# Patient Record
Sex: Male | Born: 1980 | Race: Black or African American | Hispanic: No | Marital: Married | Smoking: Current every day smoker
Health system: Southern US, Community
[De-identification: ages and names within clinical notes are randomized; demographics above are authoritative.]

## PROBLEM LIST (undated history)

## (undated) DIAGNOSIS — E119 Type 2 diabetes mellitus without complications: Secondary | ICD-10-CM

## (undated) DIAGNOSIS — G629 Polyneuropathy, unspecified: Secondary | ICD-10-CM

## (undated) DIAGNOSIS — S62309A Unspecified fracture of unspecified metacarpal bone, initial encounter for closed fracture: Secondary | ICD-10-CM

## (undated) DIAGNOSIS — I10 Essential (primary) hypertension: Secondary | ICD-10-CM

## (undated) DIAGNOSIS — R569 Unspecified convulsions: Secondary | ICD-10-CM

## (undated) HISTORY — PX: NO PAST SURGERIES: SHX2092

---
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
## 2003-12-12 ENCOUNTER — Emergency Department (HOSPITAL_COMMUNITY): Admission: EM | Admit: 2003-12-12 | Discharge: 2003-12-12 | Payer: Self-pay | Admitting: Emergency Medicine

## 2005-04-06 ENCOUNTER — Emergency Department (HOSPITAL_COMMUNITY): Admission: EM | Admit: 2005-04-06 | Discharge: 2005-04-06 | Payer: Self-pay | Admitting: Emergency Medicine

## 2005-04-07 ENCOUNTER — Emergency Department (HOSPITAL_COMMUNITY): Admission: EM | Admit: 2005-04-07 | Discharge: 2005-04-07 | Payer: Self-pay | Admitting: Emergency Medicine

## 2005-04-19 ENCOUNTER — Emergency Department (HOSPITAL_COMMUNITY): Admission: EM | Admit: 2005-04-19 | Discharge: 2005-04-19 | Payer: Self-pay | Admitting: Emergency Medicine

## 2005-07-06 ENCOUNTER — Emergency Department (HOSPITAL_COMMUNITY): Admission: EM | Admit: 2005-07-06 | Discharge: 2005-07-06 | Payer: Self-pay | Admitting: Emergency Medicine

## 2006-04-03 ENCOUNTER — Emergency Department (HOSPITAL_COMMUNITY): Admission: EM | Admit: 2006-04-03 | Discharge: 2006-04-03 | Payer: Self-pay | Admitting: Emergency Medicine

## 2006-04-14 ENCOUNTER — Emergency Department (HOSPITAL_COMMUNITY): Admission: EM | Admit: 2006-04-14 | Discharge: 2006-04-14 | Payer: Self-pay | Admitting: Emergency Medicine

## 2006-05-04 ENCOUNTER — Emergency Department (HOSPITAL_COMMUNITY): Admission: EM | Admit: 2006-05-04 | Discharge: 2006-05-05 | Payer: Self-pay | Admitting: *Deleted

## 2007-01-28 ENCOUNTER — Emergency Department (HOSPITAL_COMMUNITY): Admission: EM | Admit: 2007-01-28 | Discharge: 2007-01-28 | Payer: Self-pay | Admitting: Family Medicine

## 2007-12-12 ENCOUNTER — Emergency Department (HOSPITAL_COMMUNITY): Admission: EM | Admit: 2007-12-12 | Discharge: 2007-12-12 | Payer: Self-pay | Admitting: Family Medicine

## 2009-06-04 ENCOUNTER — Emergency Department (HOSPITAL_COMMUNITY): Admission: EM | Admit: 2009-06-04 | Discharge: 2009-06-04 | Payer: Self-pay | Admitting: Emergency Medicine

## 2009-07-04 ENCOUNTER — Emergency Department (HOSPITAL_COMMUNITY): Admission: EM | Admit: 2009-07-04 | Discharge: 2009-07-04 | Payer: Self-pay | Admitting: Emergency Medicine

## 2009-09-04 ENCOUNTER — Emergency Department (HOSPITAL_COMMUNITY): Admission: EM | Admit: 2009-09-04 | Discharge: 2009-09-04 | Payer: Self-pay | Admitting: Emergency Medicine

## 2010-09-04 ENCOUNTER — Emergency Department (HOSPITAL_COMMUNITY)
Admission: EM | Admit: 2010-09-04 | Discharge: 2010-09-04 | Disposition: A | Discharge status: Home / Self Care | Payer: Self-pay | Attending: Emergency Medicine | Admitting: Emergency Medicine

## 2010-09-04 DIAGNOSIS — R112 Nausea with vomiting, unspecified: Secondary | ICD-10-CM | POA: Insufficient documentation

## 2010-09-04 DIAGNOSIS — R070 Pain in throat: Secondary | ICD-10-CM | POA: Insufficient documentation

## 2010-09-04 DIAGNOSIS — G40909 Epilepsy, unspecified, not intractable, without status epilepticus: Secondary | ICD-10-CM | POA: Insufficient documentation

## 2010-09-04 DIAGNOSIS — F1010 Alcohol abuse, uncomplicated: Secondary | ICD-10-CM | POA: Insufficient documentation

## 2010-09-04 LAB — CBC
MCH: 28.6 pg (ref 26.0–34.0)
Platelets: 297 10*3/uL (ref 150–400)
RBC: 5.1 MIL/uL (ref 4.22–5.81)
RDW: 13.6 % (ref 11.5–15.5)
WBC: 7.3 10*3/uL (ref 4.0–10.5)

## 2010-09-04 LAB — DIFFERENTIAL
Basophils Relative: 0 % (ref 0–1)
Eosinophils Absolute: 0.1 10*3/uL (ref 0.0–0.7)
Eosinophils Relative: 1 % (ref 0–5)
Neutrophils Relative %: 49 % (ref 43–77)

## 2010-09-04 LAB — COMPREHENSIVE METABOLIC PANEL
ALT: 41 U/L (ref 0–53)
AST: 63 U/L — ABNORMAL HIGH (ref 0–37)
Albumin: 3.9 g/dL (ref 3.5–5.2)
CO2: 23 mEq/L (ref 19–32)
Calcium: 9.3 mg/dL (ref 8.4–10.5)
Chloride: 105 mEq/L (ref 96–112)
GFR calc non Af Amer: >60 mL/min (ref >60)
Sodium: 139 mEq/L (ref 135–145)

## 2010-09-04 LAB — LIPASE, BLOOD: Lipase: 19 U/L (ref 11–59)

## 2010-12-05 LAB — CULTURE, ROUTINE-ABSCESS

## 2010-12-13 ENCOUNTER — Inpatient Hospital Stay (HOSPITAL_COMMUNITY)
Admission: RE | Admit: 2010-12-13 | Discharge: 2010-12-13 | Disposition: A | Discharge status: Home / Self Care | Payer: Self-pay | Source: Ambulatory Visit | Attending: Emergency Medicine | Admitting: Emergency Medicine

## 2010-12-13 LAB — GC/CHLAMYDIA PROBE AMP, GENITAL: Chlamydia, DNA Probe: NEGATIVE

## 2011-02-26 ENCOUNTER — Emergency Department (INDEPENDENT_AMBULATORY_CARE_PROVIDER_SITE_OTHER)
Admission: EM | Admit: 2011-02-26 | Discharge: 2011-02-26 | Disposition: A | Discharge status: Home / Self Care | Payer: Self-pay | Source: Home / Self Care

## 2011-02-26 ENCOUNTER — Encounter: Payer: Self-pay | Admitting: *Deleted

## 2011-02-26 DIAGNOSIS — K089 Disorder of teeth and supporting structures, unspecified: Secondary | ICD-10-CM

## 2011-02-26 DIAGNOSIS — K0889 Other specified disorders of teeth and supporting structures: Secondary | ICD-10-CM

## 2011-02-26 MED ORDER — IBUPROFEN 800 MG PO TABS: 800.0000 mg | ORAL_TABLET | Freq: Three times a day (TID) | ORAL | Status: AC

## 2011-02-26 MED ORDER — PENICILLIN V POTASSIUM 500 MG PO TABS: 500.0000 mg | ORAL_TABLET | Freq: Three times a day (TID) | ORAL | Status: AC

## 2011-02-26 NOTE — ED Notes (Signed)
Right lower toothache onset x 2 days

## 2011-02-26 NOTE — ED Provider Notes (Signed)
History     CSN: [id number]  Arrival date & time 02/26/11  1101   None     Chief Complaint  Patient presents with  . Dental Pain    (Consider location/radiation/quality/duration/timing/severity/associated sxs/prior treatment) HPI Comments: Onset of Rt lower tooth pain 2 days ago. Pt states when he bites down the gum feels swollen. No facial swelling or fever. He does not have a dentist.  He has tried Tylenol and Goody's Powder for his pain with minimal relief.   Patient is a 30 y.o. male presenting with tooth pain.  Dental PainPrimary symptoms do not include fever or sore throat.  Additional symptoms do not include: trouble swallowing and ear pain.    History reviewed. No pertinent past medical history.  History reviewed. No pertinent past surgical history.  Family History  Problem Relation Age of Onset  . Diabetes Father     History  Substance Use Topics  . Smoking status: Current Everyday Smoker  . Smokeless tobacco: Not on file  . Alcohol Use: Yes      Review of Systems  Constitutional: Negative for fever and chills.  HENT: Negative for ear pain, congestion, sore throat, rhinorrhea and trouble swallowing.     Allergies  Review of patient's allergies indicates no known allergies.  Home Medications   Current Outpatient Rx  Name Route Sig Dispense Refill  . ACETAMINOPHEN 325 MG PO TABS Oral Take 650 mg by mouth every 6 (six) hours as needed.      . IBUPROFEN 800 MG PO TABS Oral Take 1 tablet (800 mg total) by mouth 3 (three) times daily. 15 tablet 0  . PENICILLIN V POTASSIUM 500 MG PO TABS Oral Take 1 tablet (500 mg total) by mouth 3 (three) times daily. 30 tablet 0    BP 127/93  Pulse 94  Temp(Src) 98.9 F (37.2 C) (Oral)  Resp 20  SpO2 96%  Physical Exam  Nursing note and vitals reviewed. Constitutional: He appears well-developed and well-nourished. No distress.  HENT:  Head: Normocephalic and atraumatic.  Right Ear: Tympanic membrane, external  ear and ear canal normal.  Left Ear: Tympanic membrane, external ear and ear canal normal.  Nose: Nose normal.  Mouth/Throat: Uvula is midline, oropharynx is clear and moist and mucous membranes are normal. Abnormal dentition. Dental caries present. No oropharyngeal exudate, posterior oropharyngeal edema or posterior oropharyngeal erythema.    Neck: Neck supple.  Cardiovascular: Normal rate, regular rhythm and normal heart sounds.   Pulmonary/Chest: Effort normal and breath sounds normal. No respiratory distress.  Lymphadenopathy:    He has no cervical adenopathy.  Neurological: He is alert.  Skin: Skin is warm and dry.  Psychiatric: He has a normal mood and affect.    ED Course  Procedures (including critical care time)  Labs Reviewed - No data to display No results found.   1. Pain, dental       MDM  Dental pain x 2 days without evidence of abscess.         David [name], [place] 02/26/11 1330

## 2011-02-27 NOTE — ED Provider Notes (Signed)
Medical screening examination/treatment/procedure(s) were performed by non-physician practitioner and as supervising physician I was immediately available for consultation/collaboration.   Montravious Weigelt DOUGLAS MD.    Stevon Gough Douglas Dasean Brow, MD 02/27/11 1744 

## 2011-05-11 ENCOUNTER — Encounter (HOSPITAL_COMMUNITY): Payer: Self-pay | Admitting: Emergency Medicine

## 2011-05-11 ENCOUNTER — Emergency Department (INDEPENDENT_AMBULATORY_CARE_PROVIDER_SITE_OTHER)
Admission: EM | Admit: 2011-05-11 | Discharge: 2011-05-11 | Disposition: A | Discharge status: Home / Self Care | Payer: Self-pay | Source: Home / Self Care | Attending: Emergency Medicine | Admitting: Emergency Medicine

## 2011-05-11 DIAGNOSIS — J020 Streptococcal pharyngitis: Secondary | ICD-10-CM

## 2011-05-11 MED ORDER — AMOXICILLIN 500 MG PO CAPS: 500.0000 mg | ORAL_CAPSULE | Freq: Three times a day (TID) | ORAL | Status: AC

## 2011-05-11 NOTE — ED Notes (Signed)
Pt here with c/o sore throat that began 2 days ago. Glands in neck are tender and swollen. States that throat hurts more when swallowing. Productive cough with yellow/green mucous. C/o body aches in lower back and sides of abdomen, and pain in throat. Denies fever. Pt took Nyquil and cough drops to help relieve symptoms.

## 2011-05-11 NOTE — ED Provider Notes (Signed)
History     CSN: [id number]  Arrival date & time 05/11/11  1119   First MD Initiated Contact with Patient 05/11/11 1138      Chief Complaint  Patient presents with  . Sore Throat    (Consider location/radiation/quality/duration/timing/severity/associated sxs/prior treatment) HPI Comments: Pt presents with c/o sore throat x 2 days. Pain worsens with swallowing. Glands in his neck are also tender. No nasal congestion or post nasal drainage, though does admit to mild cough. No fever or chills. Has taken Nyquil for symptoms.    History reviewed. No pertinent past medical history.  History reviewed. No pertinent past surgical history.  Family History  Problem Relation Age of Onset  . Diabetes Father     History  Substance Use Topics  . Smoking status: Current Everyday Smoker -- 0.5 packs/day    Types: Cigarettes  . Smokeless tobacco: Not on file  . Alcohol Use: 17.5 oz/week    35 drink(s) per week      Review of Systems  Constitutional: Negative for fever, chills and fatigue.  HENT: Positive for sore throat. Negative for ear pain, rhinorrhea, postnasal drip and sinus pressure.   Respiratory: Positive for cough. Negative for shortness of breath and wheezing.   Cardiovascular: Negative for chest pain and palpitations.    Allergies  Review of patient's allergies indicates no known allergies.  Home Medications   Current Outpatient Rx  Name Route Sig Dispense Refill  . ACETAMINOPHEN 325 MG PO TABS Oral Take 650 mg by mouth every 6 (six) hours as needed.      . AMOXICILLIN 500 MG PO CAPS Oral Take 1 capsule (500 mg total) by mouth 3 (three) times daily. 30 capsule 0    BP 114/71  Pulse 107  Temp(Src) 98.5 F (36.9 C) (Oral)  Resp 18  SpO2 99%  Physical Exam  Constitutional: He appears well-developed and well-nourished. No distress.  HENT:  Head: Normocephalic and atraumatic.  Right Ear: Tympanic membrane, external ear and ear canal normal.  Left Ear: Tympanic  membrane, external ear and ear canal normal.  Nose: Nose normal.  Mouth/Throat: Uvula is midline and mucous membranes are normal. Posterior oropharyngeal erythema present. No oropharyngeal exudate or posterior oropharyngeal edema.  Neck: Neck supple.  Cardiovascular: Normal rate, regular rhythm and normal heart sounds.   Pulmonary/Chest: Effort normal and breath sounds normal. No respiratory distress.  Lymphadenopathy:    He has no cervical adenopathy.  Neurological: He is alert.  Skin: Skin is warm and dry.  Psychiatric: He has a normal mood and affect.    ED Course  Procedures (including critical care time)  Labs Reviewed  POCT RAPID STREP A ([facility] ONLY) - Abnormal; Notable for the following:    Streptococcus, Group A Screen (Direct) POSITIVE (*)    All other components within normal limits   No results found.   1. Strep pharyngitis       MDM  Rapid strep pos.        Melody Comas, [place] 05/11/11 1430

## 2011-05-11 NOTE — ED Notes (Signed)
Difficulty removing chart from chart rack

## 2011-05-11 NOTE — Discharge Instructions (Signed)
Tylenol or Ibuprofen as needed for fever or discomfort. Increase fluids and rest. Do not share drinks, eating utensils or kiss until you have been on the antibiotics for at least 24 hrs.

## 2011-05-25 NOTE — ED Provider Notes (Signed)
Medical screening examination/treatment/procedure(s) were performed by non-physician practitioner and as supervising physician I was immediately available for consultation/collaboration.  Tyrone Pautsch G  D.O.    Francy Mcilvaine G Demitrius Crass, MD 05/25/11 1635 

## 2012-02-27 ENCOUNTER — Encounter (HOSPITAL_COMMUNITY): Payer: Self-pay | Admitting: Emergency Medicine

## 2012-02-27 ENCOUNTER — Emergency Department (INDEPENDENT_AMBULATORY_CARE_PROVIDER_SITE_OTHER)
Admission: EM | Admit: 2012-02-27 | Discharge: 2012-02-27 | Disposition: A | Discharge status: Home / Self Care | Payer: Self-pay | Source: Home / Self Care | Attending: Family Medicine | Admitting: Family Medicine

## 2012-02-27 DIAGNOSIS — J329 Chronic sinusitis, unspecified: Secondary | ICD-10-CM

## 2012-02-27 DIAGNOSIS — K219 Gastro-esophageal reflux disease without esophagitis: Secondary | ICD-10-CM

## 2012-02-27 MED ORDER — OMEPRAZOLE 20 MG PO CPDR: 20.0000 mg | DELAYED_RELEASE_CAPSULE | Freq: Every day | ORAL | Status: DC

## 2012-02-27 MED ORDER — AMOXICILLIN 500 MG PO CAPS: 1000.0000 mg | ORAL_CAPSULE | Freq: Two times a day (BID) | ORAL | Status: DC

## 2012-02-27 NOTE — ED Notes (Signed)
Pt c/o of poss sinus inf x2 days... Sx include: sinus pressure, frontal headache.. Denies: fevers, vomiting, nauseas, diarrhea... He is alert w/no signs of acute distress... Has taken OTC cold meds.

## 2012-02-27 NOTE — ED Provider Notes (Signed)
History     CSN: [id number]  Arrival date & time 02/27/12  1211   First MD Initiated Contact with Patient 02/27/12 1249      Chief Complaint  Patient presents with  . Sinusitis    (Consider location/radiation/quality/duration/timing/severity/associated sxs/prior treatment) HPI Comments: 31-year-old male who states 3 days ago after eating he laid down and had regurgitation of food up to his chest and into his throat. He blew his nose really hard and at that time he experienced pain in the right glabellar area. The pain is located at the medial aspect of the brow and glabella. It is mostly constant for the past 2-3 days but has improved he places his hand over the area of pain in oral improved spontaneously. When increasing pressure in his head such as blowing the nose the pain returned. It is nonradiating and he denies facial pain. He also denies fever, chills, stuffy nose, sore throat or earache. Regarding the reflux symptoms he has had this symptom occur once before. This included belching, heartburn and reflux of stomach contents into the throat.  Patient is a 31 y.o. male presenting with sinusitis.  Sinusitis  Associated symptoms include sinus pressure. Pertinent negatives include no congestion, no ear pain and no sore throat.    History reviewed. No pertinent past medical history.  History reviewed. No pertinent past surgical history.  Family History  Problem Relation Age of Onset  . Diabetes Father     History  Substance Use Topics  . Smoking status: Current Every Day Smoker -- 0.5 packs/day    Types: Cigarettes  . Smokeless tobacco: Not on file  . Alcohol Use: 17.5 oz/week    35 drink(s) per week      Review of Systems  Constitutional: Negative.   HENT: Positive for sinus pressure. Negative for hearing loss, ear pain, nosebleeds, congestion, sore throat, facial swelling, rhinorrhea, sneezing, drooling, mouth sores, trouble swallowing, neck pain, neck stiffness,  voice change, postnasal drip, tinnitus and ear discharge.   Eyes: Negative.  Negative for photophobia, pain, discharge, redness, itching and visual disturbance.  Cardiovascular: Negative.   Gastrointestinal:       Reflux symptoms as above. Asymptomatic today  Genitourinary: Negative.   Musculoskeletal: Negative.   Skin: Negative.   Neurological: Positive for headaches. Negative for dizziness, tremors, seizures, syncope, facial asymmetry, speech difficulty, weakness, light-headedness and numbness.       Headache at the glabella as described above.  Psychiatric/Behavioral: Negative.     Allergies  Review of patient's allergies indicates no known allergies.  Home Medications   Current Outpatient Rx  Name  Route  Sig  Dispense  Refill  . ACETAMINOPHEN 325 MG PO TABS   Oral   Take 650 mg by mouth every 6 (six) hours as needed.           . AMOXICILLIN 500 MG PO CAPS   Oral   Take 2 capsules (1,000 mg total) by mouth 2 (two) times daily.   40 capsule   0   . OMEPRAZOLE 20 MG PO CPDR   Oral   Take 1 capsule (20 mg total) by mouth daily.   30 capsule   0     BP 132/87  Pulse 87  Temp 98 F (36.7 C) (Oral)  Resp 18  SpO2 96%  Physical Exam  Nursing note and vitals reviewed. Constitutional: He is oriented to person, place, and time. He appears well-developed and well-nourished. No distress.  HENT:  Bilateral TMs are translucent, no redness, bulging or retraction. Oropharynx is clear, moist without discoloration or exudates. There is tenderness over the right portion of the glabella and upper most portion of the right lateral nose. No tenderness in the temple, for head or other areas of the scalp or face.  Eyes: Conjunctivae normal and EOM are normal. Right eye exhibits no discharge. Left eye exhibits no discharge.  Neck: Normal range of motion. Neck supple. No thyromegaly present.  Cardiovascular: Normal rate, regular rhythm and normal heart sounds.    Pulmonary/Chest: Effort normal and breath sounds normal. No respiratory distress. He has no wheezes. He has no rales.  Abdominal: Soft. There is no rebound.  Musculoskeletal: Normal range of motion. He exhibits no tenderness.  Lymphadenopathy:    He has no cervical adenopathy.  Neurological: He is alert and oriented to person, place, and time. He has normal reflexes. No cranial nerve deficit or sensory deficit. He exhibits normal muscle tone. Coordination and gait normal. GCS eye subscore is 4. GCS verbal subscore is 5. GCS motor subscore is 6.  Reflex Scores:      Patellar reflexes are 2+ on the right side and 2+ on the left side. Skin: Skin is warm and dry. No rash noted. No erythema. No pallor.  Psychiatric: He has a normal mood and affect.    ED Course  Procedures (including critical care time)  Labs Reviewed - No data to display No results found.   1. Sinusitis   2. GERD (gastroesophageal reflux disease)       MDM  Patient is alert, oriented in no acute distress. The discomfort remains unchanged and not radiating. He seems to make it better. Passively no neurologic symptoms nor are there signs. Neuro exam is completely normal. I suspect this is due to sinus pressure in the paranasal right medial sinus cavity. Sudafed PE 10 mg every 4 hours when necessary Guaifenesin 100 200 mg every 4-6 hours when necessary Apply heat to the area of discomfort. Amoxicillin 502 twice a day for 8 days. For any worsening new symptoms or problems may return. He was also given instructions on red flags regarding headache.        Hayden Rasmussen, NP 02/27/12 1353

## 2012-03-04 NOTE — ED Provider Notes (Signed)
Medical screening examination/treatment/procedure(s) were performed by resident physician or non-physician practitioner and as supervising physician I was immediately available for consultation/collaboration.   Edouard Gikas DOUGLAS MD.    Marsella Suman D Chalice Philbert, MD 03/04/12 2048 

## 2012-06-03 ENCOUNTER — Encounter (HOSPITAL_COMMUNITY): Payer: Self-pay | Admitting: Emergency Medicine

## 2012-06-03 ENCOUNTER — Emergency Department (INDEPENDENT_AMBULATORY_CARE_PROVIDER_SITE_OTHER)
Admission: EM | Admit: 2012-06-03 | Discharge: 2012-06-03 | Disposition: A | Discharge status: Home / Self Care | Payer: Self-pay | Source: Home / Self Care

## 2012-06-03 ENCOUNTER — Other Ambulatory Visit (HOSPITAL_COMMUNITY)
Admission: RE | Admit: 2012-06-03 | Discharge: 2012-06-03 | Disposition: A | Discharge status: Home / Self Care | Payer: Self-pay | Source: Ambulatory Visit | Attending: Family Medicine | Admitting: Family Medicine

## 2012-06-03 DIAGNOSIS — A088 Other specified intestinal infections: Secondary | ICD-10-CM

## 2012-06-03 DIAGNOSIS — Z113 Encounter for screening for infections with a predominantly sexual mode of transmission: Secondary | ICD-10-CM | POA: Insufficient documentation

## 2012-06-03 DIAGNOSIS — A084 Viral intestinal infection, unspecified: Secondary | ICD-10-CM

## 2012-06-03 NOTE — ED Provider Notes (Signed)
History     CSN: [id number]  Arrival date & time 06/03/12  1115   None     Chief Complaint  Patient presents with  . Abdominal Pain  . SEXUALLY TRANSMITTED DISEASE    (Consider location/radiation/quality/duration/timing/severity/associated sxs/prior treatment) HPI Comments: 32 year old male states that he has had no prodromal pain 3-4 days. It is associated with runny stools. The abdominal pain is worse with eating and diarrhea increases with eating. He had vomiting on day one wants only. Denies fever.  Is also requesting testing for STDs. He is asymptomatic and has no complaints regarding STD symptoms.   History reviewed. No pertinent past medical history.  History reviewed. No pertinent past surgical history.  Family History  Problem Relation Age of Onset  . Diabetes Father     History  Substance Use Topics  . Smoking status: Current Every Day Smoker -- 0.50 packs/day    Types: Cigarettes  . Smokeless tobacco: Not on file  . Alcohol Use: 17.5 oz/week    35 drink(s) per week      Review of Systems  Constitutional: Negative.   HENT: Negative.   Respiratory: Negative.   Cardiovascular: Negative for chest pain.  Gastrointestinal: Positive for abdominal pain.       As per history of present illness  Musculoskeletal: Negative.   Neurological: Negative.   Psychiatric/Behavioral: Negative.     Allergies  Review of patient's allergies indicates no known allergies.  Home Medications   Current Outpatient Rx  Name  Route  Sig  Dispense  Refill  . acetaminophen (TYLENOL) 325 MG tablet   Oral   Take 650 mg by mouth every 6 (six) hours as needed.           Marland Kitchen amoxicillin (AMOXIL) 500 MG capsule   Oral   Take 2 capsules (1,000 mg total) by mouth 2 (two) times daily.   40 capsule   0   . omeprazole (PRILOSEC) 20 MG capsule   Oral   Take 1 capsule (20 mg total) by mouth daily.   30 capsule   0     BP 124/88  Pulse 81  Temp(Src) 97.5 F (36.4 C) (Oral)   Resp 16  SpO2 100%  Physical Exam  Nursing note and vitals reviewed. Constitutional: He is oriented to person, place, and time. He appears well-developed and well-nourished. No distress.  Eyes: Conjunctivae and EOM are normal.  Neck: Normal range of motion.  Cardiovascular: Normal rate and normal heart sounds.   Pulmonary/Chest: Effort normal and breath sounds normal.  Abdominal: Soft. Bowel sounds are normal. He exhibits no distension and no mass. There is no tenderness. There is no rebound and no guarding.  Musculoskeletal: Normal range of motion. He exhibits no edema.  Lymphadenopathy:    He has no cervical adenopathy.  Neurological: He is alert and oriented to person, place, and time. He exhibits normal muscle tone.  Skin: Skin is warm and dry. No rash noted.  Psychiatric: He has a normal mood and affect.    ED Course  Procedures (including critical care time)  Labs Reviewed  URINE CYTOLOGY ANCILLARY ONLY   No results found.   1. Viral gastroenteritis       MDM  Supplements we will also give Pedialyte small frequent amounts. Do not eat heavy meals or foods that are greasy, spicy, fatty, fast foods, stay or french fries. Start off with crackers and broth. Reconstructions for diet for gastritis and diarrhea. Since here diarrhea stools are for less  per day do not need Imodium at this time. Just keep drinking fluids not having any vomiting or nausea in the past couple of days Zofran unnecessary at this time. Followup with your primary care doctor later this week or if worse or new symptoms may return The urine for ancillary testing was obtained.        Hayden Rasmussen, NP 06/03/12 1359

## 2012-06-03 NOTE — ED Notes (Signed)
Pt c/o stomach pain x 4days. Diarrhea x 4 days and one report of emesis. Drank Pepto 3 days ago with no relief. Feels sharp gas pains. Denies abdominal pain and fever. Pt also would like to be checked for STDs.

## 2012-06-05 NOTE — ED Provider Notes (Signed)
Medical screening examination/treatment/procedure(s) were performed by non-physician practitioner and as supervising physician I was immediately available for consultation/collaboration.   MORENO-COLL,Monteen Toops; MD  Crewe Heathman Moreno-Coll, MD 06/05/12 0607 

## 2013-11-26 ENCOUNTER — Encounter (HOSPITAL_COMMUNITY): Payer: Self-pay | Admitting: Emergency Medicine

## 2013-11-26 ENCOUNTER — Emergency Department (HOSPITAL_COMMUNITY)
Admission: EM | Admit: 2013-11-26 | Discharge: 2013-11-26 | Discharge status: Against Medical Advice | Payer: Self-pay | Attending: Emergency Medicine | Admitting: Emergency Medicine

## 2013-11-26 DIAGNOSIS — R7309 Other abnormal glucose: Secondary | ICD-10-CM | POA: Insufficient documentation

## 2013-11-26 DIAGNOSIS — H538 Other visual disturbances: Secondary | ICD-10-CM | POA: Insufficient documentation

## 2013-11-26 DIAGNOSIS — R5383 Other fatigue: Secondary | ICD-10-CM

## 2013-11-26 DIAGNOSIS — R631 Polydipsia: Secondary | ICD-10-CM | POA: Insufficient documentation

## 2013-11-26 DIAGNOSIS — R350 Frequency of micturition: Secondary | ICD-10-CM | POA: Insufficient documentation

## 2013-11-26 DIAGNOSIS — R5381 Other malaise: Secondary | ICD-10-CM | POA: Insufficient documentation

## 2013-11-26 LAB — CBC
HCT: 46.5 % (ref 39.0–52.0)
Hemoglobin: 16.7 g/dL (ref 13.0–17.0)
MCH: 28.8 pg (ref 26.0–34.0)
MCHC: 35.9 g/dL (ref 30.0–36.0)
MCV: 80.3 fL (ref 78.0–100.0)
Platelets: 303 10*3/uL (ref 150–400)
RBC: 5.79 MIL/uL (ref 4.22–5.81)
RDW: 12.6 % (ref 11.5–15.5)
WBC: 8.3 10*3/uL (ref 4.0–10.5)

## 2013-11-26 LAB — URINALYSIS, ROUTINE W REFLEX MICROSCOPIC
BILIRUBIN URINE: NEGATIVE
Glucose, UA: >1000 mg/dL — AB
HGB URINE DIPSTICK: NEGATIVE
Ketones, ur: NEGATIVE mg/dL
Leukocytes, UA: NEGATIVE
NITRITE: NEGATIVE
PH: 5.5 (ref 5.0–8.0)
Protein, ur: NEGATIVE mg/dL
Urobilinogen, UA: 0.2 mg/dL (ref 0.0–1.0)

## 2013-11-26 LAB — COMPREHENSIVE METABOLIC PANEL
ALBUMIN: 4.2 g/dL (ref 3.5–5.2)
ALK PHOS: 91 U/L (ref 39–117)
ALT: 49 U/L (ref 0–53)
AST: 89 U/L — ABNORMAL HIGH (ref 0–37)
Anion gap: 17 — ABNORMAL HIGH (ref 5–15)
BUN: 15 mg/dL (ref 6–23)
CO2: 22 mEq/L (ref 19–32)
Calcium: 9.7 mg/dL (ref 8.4–10.5)
Chloride: 91 mEq/L — ABNORMAL LOW (ref 96–112)
Creatinine, Ser: 0.89 mg/dL (ref 0.50–1.35)
GFR calc Af Amer: >90 mL/min (ref >90)
GFR calc non Af Amer: >90 mL/min (ref >90)
Glucose, Bld: 505 mg/dL — ABNORMAL HIGH (ref 70–99)
POTASSIUM: 4.7 meq/L (ref 3.7–5.3)
SODIUM: 130 meq/L — AB (ref 137–147)
TOTAL PROTEIN: 8.2 g/dL (ref 6.0–8.3)
Total Bilirubin: 0.6 mg/dL (ref 0.3–1.2)

## 2013-11-26 LAB — CBG MONITORING, ED: GLUCOSE-CAPILLARY: 468 mg/dL — AB (ref 70–99)

## 2013-11-26 LAB — URINE MICROSCOPIC-ADD ON

## 2013-11-26 NOTE — ED Notes (Addendum)
Unable to locate patient in waiting area x2. EDPA made aware.

## 2013-11-26 NOTE — ED Notes (Signed)
Pt reports not feeling well for extended amount of time. Having fatigue, blurred vision, thirst and frequent urination. Checked cbg on familys glucometer and it read "HIGH." no acute distress noted at triage.

## 2013-11-27 ENCOUNTER — Emergency Department (HOSPITAL_COMMUNITY)
Admission: EM | Admit: 2013-11-27 | Discharge: 2013-11-27 | Disposition: A | Discharge status: Home / Self Care | Payer: Self-pay | Attending: Emergency Medicine | Admitting: Emergency Medicine

## 2013-11-27 ENCOUNTER — Emergency Department (INDEPENDENT_AMBULATORY_CARE_PROVIDER_SITE_OTHER)
Admission: EM | Admit: 2013-11-27 | Discharge: 2013-11-27 | Disposition: A | Discharge status: Nursing Home (Includes ICF) | Payer: Self-pay | Source: Home / Self Care | Attending: Family Medicine | Admitting: Family Medicine

## 2013-11-27 ENCOUNTER — Encounter (HOSPITAL_COMMUNITY): Payer: Self-pay | Admitting: Emergency Medicine

## 2013-11-27 DIAGNOSIS — R739 Hyperglycemia, unspecified: Secondary | ICD-10-CM

## 2013-11-27 DIAGNOSIS — Z79899 Other long term (current) drug therapy: Secondary | ICD-10-CM | POA: Insufficient documentation

## 2013-11-27 DIAGNOSIS — R7309 Other abnormal glucose: Secondary | ICD-10-CM | POA: Insufficient documentation

## 2013-11-27 DIAGNOSIS — E1165 Type 2 diabetes mellitus with hyperglycemia: Secondary | ICD-10-CM

## 2013-11-27 DIAGNOSIS — Z87891 Personal history of nicotine dependence: Secondary | ICD-10-CM | POA: Insufficient documentation

## 2013-11-27 DIAGNOSIS — R3589 Other polyuria: Secondary | ICD-10-CM | POA: Insufficient documentation

## 2013-11-27 DIAGNOSIS — Z792 Long term (current) use of antibiotics: Secondary | ICD-10-CM | POA: Insufficient documentation

## 2013-11-27 DIAGNOSIS — R5383 Other fatigue: Secondary | ICD-10-CM

## 2013-11-27 DIAGNOSIS — H539 Unspecified visual disturbance: Secondary | ICD-10-CM | POA: Insufficient documentation

## 2013-11-27 DIAGNOSIS — R631 Polydipsia: Secondary | ICD-10-CM | POA: Insufficient documentation

## 2013-11-27 DIAGNOSIS — R110 Nausea: Secondary | ICD-10-CM | POA: Insufficient documentation

## 2013-11-27 DIAGNOSIS — IMO0002 Reserved for concepts with insufficient information to code with codable children: Secondary | ICD-10-CM

## 2013-11-27 DIAGNOSIS — R5381 Other malaise: Secondary | ICD-10-CM | POA: Insufficient documentation

## 2013-11-27 DIAGNOSIS — IMO0001 Reserved for inherently not codable concepts without codable children: Secondary | ICD-10-CM

## 2013-11-27 DIAGNOSIS — R358 Other polyuria: Secondary | ICD-10-CM | POA: Insufficient documentation

## 2013-11-27 LAB — CBC WITH DIFFERENTIAL/PLATELET
Basophils Absolute: 0 10*3/uL (ref 0.0–0.1)
Basophils Relative: 0 % (ref 0–1)
EOS PCT: 1 % (ref 0–5)
Eosinophils Absolute: 0.1 10*3/uL (ref 0.0–0.7)
HEMATOCRIT: 46.7 % (ref 39.0–52.0)
HEMOGLOBIN: 16.4 g/dL (ref 13.0–17.0)
LYMPHS ABS: 2.7 10*3/uL (ref 0.7–4.0)
LYMPHS PCT: 34 % (ref 12–46)
MCH: 28.3 pg (ref 26.0–34.0)
MCHC: 35.1 g/dL (ref 30.0–36.0)
MCV: 80.7 fL (ref 78.0–100.0)
MONOS PCT: 6 % (ref 3–12)
Monocytes Absolute: 0.5 10*3/uL (ref 0.1–1.0)
Neutro Abs: 4.8 10*3/uL (ref 1.7–7.7)
Neutrophils Relative %: 59 % (ref 43–77)
Platelets: 281 10*3/uL (ref 150–400)
RBC: 5.79 MIL/uL (ref 4.22–5.81)
RDW: 12.5 % (ref 11.5–15.5)
WBC: 8 10*3/uL (ref 4.0–10.5)

## 2013-11-27 LAB — CBG MONITORING, ED
GLUCOSE-CAPILLARY: 434 mg/dL — AB (ref 70–99)
Glucose-Capillary: 409 mg/dL — ABNORMAL HIGH (ref 70–99)
Glucose-Capillary: 301 mg/dL — ABNORMAL HIGH (ref 70–99)
Glucose-Capillary: 240 mg/dL — ABNORMAL HIGH (ref 70–99)

## 2013-11-27 LAB — URINALYSIS, ROUTINE W REFLEX MICROSCOPIC
Bilirubin Urine: NEGATIVE
Glucose, UA: >1000 mg/dL — AB
Hgb urine dipstick: NEGATIVE
Ketones, ur: 15 mg/dL — AB
LEUKOCYTES UA: NEGATIVE
Nitrite: NEGATIVE
PH: 5 (ref 5.0–8.0)
Protein, ur: NEGATIVE mg/dL
Specific Gravity, Urine: >1.046 — ABNORMAL HIGH (ref 1.005–1.030)
Urobilinogen, UA: 0.2 mg/dL (ref 0.0–1.0)

## 2013-11-27 LAB — BASIC METABOLIC PANEL
Anion gap: 16 — ABNORMAL HIGH (ref 5–15)
BUN: 15 mg/dL (ref 6–23)
CO2: 24 meq/L (ref 19–32)
Calcium: 9.5 mg/dL (ref 8.4–10.5)
Chloride: 96 mEq/L (ref 96–112)
Creatinine, Ser: 0.93 mg/dL (ref 0.50–1.35)
GFR calc Af Amer: >90 mL/min (ref >90)
GFR calc non Af Amer: >90 mL/min (ref >90)
GLUCOSE: 366 mg/dL — AB (ref 70–99)
Potassium: 4.6 mEq/L (ref 3.7–5.3)
SODIUM: 136 meq/L — AB (ref 137–147)

## 2013-11-27 LAB — POCT I-STAT, CHEM 8
BUN: 15 mg/dL (ref 6–23)
CREATININE: 1 mg/dL (ref 0.50–1.35)
Calcium, Ion: 1.21 mmol/L (ref 1.12–1.23)
Chloride: 97 mEq/L (ref 96–112)
Glucose, Bld: 428 mg/dL — ABNORMAL HIGH (ref 70–99)
HCT: 55 % — ABNORMAL HIGH (ref 39.0–52.0)
HEMOGLOBIN: 18.7 g/dL — AB (ref 13.0–17.0)
POTASSIUM: 4.1 meq/L (ref 3.7–5.3)
SODIUM: 133 meq/L — AB (ref 137–147)
TCO2: 24 mmol/L (ref 0–100)

## 2013-11-27 LAB — URINE MICROSCOPIC-ADD ON

## 2013-11-27 MED ORDER — SODIUM CHLORIDE 0.9 % IV BOLUS (SEPSIS)
1000.0000 mL | Freq: Once | INTRAVENOUS | Status: AC
  Administered 2013-11-27: 1000 mL via INTRAVENOUS

## 2013-11-27 MED ORDER — METFORMIN HCL 500 MG PO TABS: 500.0000 mg | ORAL_TABLET | Freq: Two times a day (BID) | ORAL | Status: DC

## 2013-11-27 NOTE — ED Notes (Signed)
Patient states he was here last night.  Patient states he was told he had high bs, but was unable to stay to finish up the visit.  Patient just wants to be told what results were from previous visit.

## 2013-11-27 NOTE — ED Provider Notes (Signed)
CSN: [id number]     Arrival date & time 11/27/13  7846 History   First MD Initiated Contact with Patient 11/27/13 1346     Chief Complaint  Patient presents with  . Blurred Vision  . urine frequency   . Polydipsia     (Consider location/radiation/quality/duration/timing/severity/associated sxs/prior Treatment) HPI Comments: The patient is a 33 year old male presenting to the emergency room in with a chief complaint of elevated glucose. Patient reports he was seen earlier at urgent care with a elevated glucose. The patient reports nausea, fatigue, polyuria, polydipsia, visual changes for several days. The patient reports using a family members home glucose meter and it read "too high". He reports he came to the emergency department last night but left due to the wait.  The history is provided by the patient. No language interpreter was used.    History reviewed. No pertinent past medical history. History reviewed. No pertinent past surgical history. Family History  Problem Relation Age of Onset  . Diabetes Father    History  Substance Use Topics  . Smoking status: Former Smoker -- 0.50 packs/day    Quit date: 10/27/2013  . Smokeless tobacco: Not on file  . Alcohol Use: 17.5 oz/week    35 drink(s) per week    Review of Systems  Constitutional: Positive for fatigue. Negative for fever and chills.  Eyes: Positive for visual disturbance.  Gastrointestinal: Positive for nausea. Negative for vomiting and abdominal pain.  Endocrine: Positive for polydipsia and polyuria.  Genitourinary: Negative for dysuria.      Allergies  Review of patient's allergies indicates no known allergies.  Home Medications   Prior to Admission medications   Medication Sig Start Date End Date Taking? Authorizing Provider  acetaminophen (TYLENOL) 325 MG tablet Take 650 mg by mouth every 6 (six) hours as needed.      Historical Provider, MD  amoxicillin (AMOXIL) 500 MG capsule Take 2 capsules (1,000  mg total) by mouth 2 (two) times daily. 02/27/12   Hayden Rasmussen, NP  Aspirin-Acetaminophen (GOODYS BODY PAIN PO) Take by mouth.    Historical Provider, MD  omeprazole (PRILOSEC) 20 MG capsule Take 1 capsule (20 mg total) by mouth daily. 02/27/12   Hayden Rasmussen, NP   BP 128/93  Pulse 93  Temp(Src) 98.4 F (36.9 C) (Oral)  Resp 20  SpO2 100% Physical Exam  Nursing note and vitals reviewed. Constitutional: He is oriented to person, place, and time. He appears well-developed and well-nourished. No distress.  HENT:  Head: Normocephalic and atraumatic.  Eyes: Conjunctivae and EOM are normal. Pupils are equal, round, and reactive to light.  Neck: Neck supple.  Cardiovascular: Normal rate and regular rhythm.   Pulmonary/Chest: Effort normal and breath sounds normal. No respiratory distress. He has no wheezes. He has no rales.  Abdominal: Soft. There is no tenderness. There is no rebound and no guarding.  Musculoskeletal: Normal range of motion.  Neurological: He is alert and oriented to person, place, and time.  Skin: Skin is warm and dry. He is not diaphoretic.  Psychiatric: He has a normal mood and affect. His behavior is normal.    ED Course  Procedures (including critical care time) Labs Review Results for orders placed during the hospital encounter of 11/27/13  CBC WITH DIFFERENTIAL      Result Value Ref Range   WBC 8.0  4.0 - 10.5 K/uL   RBC 5.79  4.22 - 5.81 MIL/uL   Hemoglobin 16.4  13.0 - 17.0 g/dL  HCT 46.7  39.0 - 52.0 %   MCV 80.7  78.0 - 100.0 fL   MCH 28.3  26.0 - 34.0 pg   MCHC 35.1  30.0 - 36.0 g/dL   RDW 69.6  29.5 - 28.4 %   Platelets 281  150 - 400 K/uL   Neutrophils Relative % 59  43 - 77 %   Neutro Abs 4.8  1.7 - 7.7 K/uL   Lymphocytes Relative 34  12 - 46 %   Lymphs Abs 2.7  0.7 - 4.0 K/uL   Monocytes Relative 6  3 - 12 %   Monocytes Absolute 0.5  0.1 - 1.0 K/uL   Eosinophils Relative 1  0 - 5 %   Eosinophils Absolute 0.1  0.0 - 0.7 K/uL   Basophils  Relative 0  0 - 1 %   Basophils Absolute 0.0  0.0 - 0.1 K/uL  BASIC METABOLIC PANEL      Result Value Ref Range   Sodium 136 (*) 137 - 147 mEq/L   Potassium 4.6  3.7 - 5.3 mEq/L   Chloride 96  96 - 112 mEq/L   CO2 24  19 - 32 mEq/L   Glucose, Bld 366 (*) 70 - 99 mg/dL   BUN 15  6 - 23 mg/dL   Creatinine, Ser 1.32  0.50 - 1.35 mg/dL   Calcium 9.5  8.4 - 44.0 mg/dL   GFR calc non Af Amer >90  >90 mL/min   GFR calc Af Amer >90  >90 mL/min   Anion gap 16 (*) 5 - 15  URINALYSIS, ROUTINE W REFLEX MICROSCOPIC      Result Value Ref Range   Color, Urine YELLOW  YELLOW   APPearance CLEAR  CLEAR   Specific Gravity, Urine >1.046 (*) 1.005 - 1.030   pH 5.0  5.0 - 8.0   Glucose, UA >1000 (*) NEGATIVE mg/dL   Hgb urine dipstick NEGATIVE  NEGATIVE   Bilirubin Urine NEGATIVE  NEGATIVE   Ketones, ur 15 (*) NEGATIVE mg/dL   Protein, ur NEGATIVE  NEGATIVE mg/dL   Urobilinogen, UA 0.2  0.0 - 1.0 mg/dL   Nitrite NEGATIVE  NEGATIVE   Leukocytes, UA NEGATIVE  NEGATIVE  URINE MICROSCOPIC-ADD ON      Result Value Ref Range   Squamous Epithelial / LPF RARE  RARE  CBG MONITORING, ED      Result Value Ref Range   Glucose-Capillary 434 (*) 70 - 99 mg/dL  CBG MONITORING, ED      Result Value Ref Range   Glucose-Capillary 409 (*) 70 - 99 mg/dL  CBG MONITORING, ED      Result Value Ref Range   Glucose-Capillary 301 (*) 70 - 99 mg/dL  CBG MONITORING, ED      Result Value Ref Range   Glucose-Capillary 240 (*) 70 - 99 mg/dL    Imaging Review No results found.   EKG Interpretation None      MDM   Final diagnoses:  Hyperglycemia   Patient presents with urgent care with new onset of diabetes, initial CBG 409, no obvious precipitating factor, 2L NS boluses ordered.   Case management consult at, resources given she will followup with the patient tomorrow for an appointment with wellness center. Pt will be started on metformin and follow up with Wellness center. Discussed lab results, results, and  treatment plan with the patient. Return precautions given. Reports understanding and no other concerns at this time.  Patient is stable for discharge at  this time.  Meds given in ED:  Medications  sodium chloride 0.9 % bolus 1,000 mL (0 mLs Intravenous Stopped 11/27/13 1506)  sodium chloride 0.9 % bolus 1,000 mL (0 mLs Intravenous Stopped 11/27/13 1506)  sodium chloride 0.9 % bolus 1,000 mL (0 mLs Intravenous Stopped 11/27/13 1640)    Discharge Medication List as of 11/27/2013  4:16 PM    START taking these medications   Details  metFORMIN (GLUCOPHAGE) 500 MG tablet Take 1 tablet (500 mg total) by mouth 2 (two) times daily with a meal., Starting 11/27/2013, Until Discontinued, Print          Mellody Drown, PA-C 11/28/13 2209

## 2013-11-27 NOTE — Progress Notes (Signed)
  CARE MANAGEMENT ED NOTE 11/27/2013  Patient:  David [name], David [name]   Account Number:  [id number]  Date Initiated:  11/27/2013  Documentation initiated by:  Ferdinand Cava  Subjective/Objective Assessment:   33 yo male presenting to the ED returning to find out results from previous visit     Subjective/Objective Assessment Detail:     Action/Plan:   CM will follow up with patient after discharge with follow up appointment with [facility].   Action/Plan Detail:   Anticipated DC Date:       Status Recommendation to Physician:   Result of Recommendation:  Agreed    DC Planning Services  CM consult  PCP issues  Other    Choice offered to / List presented to:  C-1 Patient          Status of service:  Completed, signed off  ED Comments:   ED Comments Detail:  CM consulted to assist patient with PCP resources. This CM spoke with ED PA Mellody Drown and she stated that the patient is newly diagnosed DM. This CM spoke with the patient regarding his discharge medications and this CM informed the patient that his discharge prescription is on the $4 list at [facility]. The patient did confirm that he does not have a PCP or insurance. This CM provided the patient with a [employer] and a pamphlet for the [facility]. This Cm also contacted the [facility] and was informed to call back in the morning to secure an appointment for the patient. This CM confirmed contact information with the patient and explained that after an appointment is made in the morning this Cm will contact him with appointment information. Discussed the importance of following up with a PCP. Patient verbalized understanding and had no other questions or concerns. ED PA updated with information provided.

## 2013-11-27 NOTE — Discharge Instructions (Signed)
Call for a follow up appointment with a Family or Primary Care Provider.  Follow up with the Wellness center as discussed by the Case Manager for follow up on your elevated glucose reading. Return if Symptoms worsen.   Take medication as prescribed.    Emergency Department Resource Guide 1) Find a Doctor and Pay Out of Pocket Although you won't have to find out who is covered by your insurance plan, it is a good idea to ask around and get recommendations. You will then need to call the office and see if the doctor you have chosen will accept you as a new patient and what types of options they offer for patients who are self-pay. Some doctors offer discounts or will set up payment plans for their patients who do not have insurance, but you will need to ask so you aren't surprised when you get to your appointment.  2) Contact Your Local Health Department Not all health departments have doctors that can see patients for sick visits, but many do, so it is worth a call to see if yours does. If you don't know where your local health department is, you can check in your phone book. The CDC also has a tool to help you locate your state's health department, and many state websites also have listings of all of their local health departments.  3) Find a Walk-in Clinic If your illness is not likely to be very severe or complicated, you may want to try a walk in clinic. These are popping up all over the country in pharmacies, drugstores, and shopping centers. They're usually staffed by nurse practitioners or physician assistants that have been trained to treat common illnesses and complaints. They're usually fairly quick and inexpensive. However, if you have serious medical issues or chronic medical problems, these are probably not your best option.  No Primary Care Doctor: - Call Health Connect at  [phone] - they can help you locate a primary care doctor that  accepts your insurance, provides certain services,  etc. - Physician Referral Service- [phone]  Chronic Pain Problems: Organization         Address  Phone   Notes  [facility]  [phone] Patients need to be referred by their primary care doctor.   Medication Assistance: Organization         Address  Phone   Notes  [facility] Medication [facility] [address] [place] [place], [place] [address] [phone] --Must be a resident of [facility] -- Must have NO insurance coverage whatsoever (no Medicaid/ Medicare, etc.) -- The pt. MUST have a primary care doctor that directs their care regularly and follows them in the community   MedAssist  [phone]   [employer]  [phone]    Agencies that provide inexpensive medical care: Organization         Address  Phone   Notes  [facility]  [phone]   [facility]    [phone]   [facility] [address] [place], [place] [address] [phone]   [facility] [address] [place], [place] [phone]   Planned Parenthood    [phone]   [facility]    [phone]   [facility] and [facility]  [address],  Phone:  [phone], Fax:  [phone] Hours of Operation:  9 am - 6 pm, M-F.  Also accepts Medicaid/Medicare and self-pay.  [facility] for [place], [address], [place] Phone: [phone], Fax: [phone]. Hours of Operation:  8:30 am - 5:30 pm, M-F.  Also accepts Medicaid and self-pay.  [facility] [place] [address] [place] Phone: [phone]   [place], [place], [place] [phone], Ext. [phone] Mondays & Thursdays: 7-9 AM.  First 15 patients are seen on a first come, first serve basis.    [place] Providers:  Organization         Address  Phone   Notes  [facility] [address],  [phone] Also accepts self-pay patients.  [facility] [address], [place]  [phone]   [place], [address], [place] [phone]   [facility] [address] [place] [phone]   Lucianne Lei [address], [place]   [phone] Only accepts [place] Access [place] patients after they have their name applied to their card.   Self-Pay (no insurance) in [facility]:  Organization         Address  Phone   Notes  Sickle Cell Patients, [facility] Internal Medicine [place] [phone]   [facility] [place] [phone]   [facility] [place]  [place], [place] [facility], [place] [phone]   [facility]/Dr. Osei-Bonsu  [address], [place] or [address], [place] [phone] Phone number for both [place] and [place] locations is the same.  Urgent Medical and [facility] [address] [place] [phone]   [facility] [address] [place] or [address] [phone]   [facility] [address], [place] [phone], phone; [phone], fax Sees patients 1st and 3rd Saturday of every month.  Must not qualify for public or private insurance (i.e. Medicaid, Medicare, Becker Health Choice, Veterans' Benefits)  Household income should be no more than 200% of the poverty level The clinic cannot treat you if you are pregnant or think you are pregnant  Sexually transmitted diseases are not treated at the clinic.    Dental Care: Organization         Address  Phone  Notes  [facility] Department of [facility] [phone] Accepts children up to  age 51 who are enrolled in [place] or [place]; pregnant women with a Medicaid card; and children who have applied for Medicaid or [place] Health Choice, but were declined, whose parents can pay a reduced fee at time of service.  [facility] Department of [facility]  [address], [place] [phone] Accepts children up to age 24 who are enrolled in [place] or [place]; pregnant women with a Medicaid card; and children who have applied for Medicaid or [place] Health Choice, but were declined, whose parents can pay a reduced fee at time of service.  [place] Adult [facility] Access PROGRAM  [place] [phone] Patients are seen by appointment only. Walk-ins are not accepted. [place] will see patients 54 years of age and older. Monday - Tuesday (  8am-5pm) Most Wednesdays (8:30-5pm) $30 per visit, cash only  [facility] Adult Dental Access PROGRAM  [address], [facility] [phone] Patients are seen by appointment only. Walk-ins are not accepted. [place] will see patients 2 years of age and older. One Wednesday Evening (Monthly: Volunteer Based).  $30 per visit, cash only  [place]  [phone] for adults; Children under age 70, call Graduate Pediatric Dentistry at [phone]. Children aged 26-14, please call [phone] to request a pediatric application.  Dental services are provided in all areas of dental care including fillings, crowns and bridges, complete and partial dentures, implants, gum treatment, root canals, and extractions. Preventive care is also provided. Treatment is provided to both adults and children. Patients are selected via a lottery and there is often a waiting list.   [facility] [address], [place]  [phone] [email or web address]   [facility] [address] [place], [place] [phone], Ext. [phone] Second and Fourth Thursday of  each month, opens at 6:30 AM; Clinic ends at 9 AM.  Patients are seen on a first-come first-served basis, and a limited number are seen during each clinic.   [facility]  [address] Hillard Danker [place], [place] [phone]   Eligibility Requirements You must have lived in [place], [place], or [place] counties for at least the last three months.   You cannot be eligible for state or federal sponsored [employer], including [employer], [place], or [employer].   You generally cannot be eligible for healthcare insurance through your employer.    How to apply: Eligibility screenings are held every Tuesday and Wednesday afternoon from 1:00 pm until 4:00 pm. You do not need an appointment for the interview!  [facility] [address], [place], [place]   [place]  Grants Department  [place]  [phone]    Behavioral Health Resources in the Community: Intensive Outpatient Programs Organization         Address  Phone  Notes  [place]. [address] [place], [place] [phone]   [facility] Outpatient [address], [place], [place]   ADS: Alcohol & Drug Svcs [address] [place], [place]   [place] [address],  [place], [place] or [phone]   Substance Abuse Resources Organization         Address  Phone  Notes  Alcohol and Drug Services  [phone]   [place]  [phone]   [facility] [place]   Chinita Pester  [phone]   Residential & Outpatient Substance Abuse Program  [phone]   Psychological Services Organization         Address  Phone  Notes  [facility] [place]  [place]  [phone]   [place] [address]  [place] or [phone]    Mobile Crisis Teams Organization         Address  Phone  Notes  [facility], Mobile Crisis Care Unit  [phone]   [facility]  [address] [place], [place]   [place] [address], [place] [phone]    Self-Help/Support Groups Organization         Address  Phone  Notes  [facility]ental [facility] of [place] - variety of support groups  [place] Call for more information  Narcotics Anonymous (NA), [facility]aring [facility] [address], [employer] [place]  2 meetings at this location   Special educational needs teacher         Address  Phone  Notes  ASAP Residential Treatment [place],    [place]  [phone]   [facility]  [address], [place] [phone], [place], [place]   [place], [place] [phone] Admissions: 8am-3pm M-F  Incentives Substance [place] [address],    [place], [place] [phone]   [facility] [address] [place], [place], [place]   [facility] [address]  [place], [place]   [facility] Programs - Intensive Outpatient [address], [place], [place]   [facility] ([place].,  [place], [place] [phone] or [phone]   [facility]idential Treatment Services (RTS) [address] [place], [place] Accepts Medicaid  [facility] [place] [address],  [place] [phone] Substance Abuse/Addiction Treatment   [facility] Organization         Address  Phone  Notes  [facility]  [phone]   Domenic Schwab, PhD [address] Arlis [place], [place]   [phone] or [phone]   [place], [place] [phone]     [facility] [address], [place], [place] [phone] Insurance/Medicaid/sponsorship through [facility] and Families [address], [address]                                    [place], [place] [phone] [place] [address], [place] [phone]    Dr. Adele Schilder  [phone]   [facility] Dept. 1) [address], [place] 2) [place] 3)  [address], [place] [phone]  [phone]   [place] [phone] or [phone] (After Hours)

## 2013-11-27 NOTE — ED Notes (Addendum)
Went to the ed yesterday because a family member checked his sugar at home it was "high".  Patient's father is diabetic.  Patient reported feeling tired, frequent at night, blurry vision-these complains going on for 2 days.  When telling his dad how he felt, dad checked sugar, sugar read "high", went to ed and had to leave ama because of child care issues.  Has noticed he is drinking more water lately

## 2013-11-27 NOTE — ED Provider Notes (Signed)
CSN: [id number]     Arrival date & time 11/27/13  4098 History   First MD Initiated Contact with Patient 11/27/13 [phone]     Chief Complaint  Patient presents with  . Hyperglycemia   (Consider location/radiation/quality/duration/timing/severity/associated sxs/prior Treatment) Patient is a 33 y.o. male presenting with hyperglycemia. The history is provided by the patient.  Hyperglycemia Blood sugar level PTA:  High (>500) Severity:  Moderate Onset quality:  Sudden Duration:  3 days Timing:  Constant Progression:  Unchanged Chronicity:  New Diabetes status:  Non-diabetic Context: new diabetes diagnosis   Relieved by:  None tried Ineffective treatments:  None tried Associated symptoms: blurred vision, increased thirst and polyuria   Associated symptoms: no abdominal pain, no chest pain, no confusion, no dehydration, no dizziness, no dysuria, no fever, no nausea, no shortness of breath, no vomiting and no weakness   Risk factors: family hx of diabetes   Risk factors: no hx of DKA    Was drinking a lot of orange and cranberry juice.  History reviewed. No pertinent past medical history. History reviewed. No pertinent past surgical history. Family History  Problem Relation Age of Onset  . Diabetes Father    History  Substance Use Topics  . Smoking status: Current Every Day Smoker -- 0.50 packs/day    Types: Cigarettes  . Smokeless tobacco: Not on file  . Alcohol Use: 17.5 oz/week    35 drink(s) per week    Review of Systems  Constitutional: Negative for fever and chills.  Eyes: Positive for blurred vision and visual disturbance (blurry vision).  Respiratory: Negative for shortness of breath.   Cardiovascular: Negative for chest pain.  Gastrointestinal: Negative for nausea, vomiting and abdominal pain.  Endocrine: Positive for polydipsia and polyuria.  Genitourinary: Negative for dysuria.  Allergic/Immunologic: Positive for environmental allergies.  Neurological: Negative  for dizziness.  Psychiatric/Behavioral: Negative for confusion.    Allergies  Review of patient's allergies indicates no known allergies.  Home Medications   Prior to Admission medications   Medication Sig Start Date End Date Taking? Authorizing Provider  Aspirin-Acetaminophen (GOODYS BODY PAIN PO) Take by mouth.   Yes Historical Provider, MD  acetaminophen (TYLENOL) 325 MG tablet Take 650 mg by mouth every 6 (six) hours as needed.      Historical Provider, MD  amoxicillin (AMOXIL) 500 MG capsule Take 2 capsules (1,000 mg total) by mouth 2 (two) times daily. 02/27/12   Hayden Rasmussen, NP  omeprazole (PRILOSEC) 20 MG capsule Take 1 capsule (20 mg total) by mouth daily. 02/27/12   Hayden Rasmussen, NP   BP 131/98  Pulse 92  Temp(Src) 97.5 F (36.4 C) (Oral)  Resp 14  SpO2 98% Physical Exam  Constitutional: He is oriented to person, place, and time. He appears well-developed and well-nourished.  HENT:  Mouth/Throat: Oropharynx is clear and moist. Abnormal dentition (multiple tooth fractures). Dental caries present.  Eyes: Conjunctivae are normal. Pupils are equal, round, and reactive to light.  Neck: Normal range of motion. Neck supple.  Cardiovascular: Normal rate, regular rhythm and normal heart sounds.   Pulmonary/Chest: Effort normal.  Abdominal: Soft. Normal appearance. There is no tenderness.  Musculoskeletal: Normal range of motion.  Neurological: He is alert and oriented to person, place, and time.  Skin: Skin is warm and dry. No lesion (bilateral palmar surfaces of feet) noted.    ED Course  Procedures (including critical care time) Labs Review Labs Reviewed  POCT I-STAT, CHEM 8 - Abnormal; Notable for the following:  Sodium 133 (*)    Glucose, Bld 428 (*)    Hemoglobin 18.7 (*)    HCT 55.0 (*)    All other components within normal limits  emergen  Imaging Review No results found.   MDM   1. Uncontrolled diabetes mellitus   2. Hyperglycemia    Patient evaluated  in ED yesterday but left AMA due to family obligations. Currently not in DKA or HONK. Patient appears well hydrated on exam but would most likely benefit from IV hydration and some insulin to bring glucose down. Patient will require diabetic teaching and should find a primary care physician to manage this new chronic diagnosis. Will transfer to ED via shuttle for further management of new onset diabetes, likely type 2.     Jacquelin Hawking, MD 11/27/13 [phone]

## 2013-11-27 NOTE — ED Provider Notes (Signed)
David [name] is a 33 y.o. male who presents to Urgent Care today for urinary frequency. Patient has fatigue urinary frequency and elevated blood sugar. He denies a history of diabetes but notes that his father who is diabetic checked his blood sugar with a home glucometer and it read as high. He presented to the emergency room yesterday but left without being seen because of childcare issues. No fevers or chills nausea vomiting or diarrhea.   History reviewed. No pertinent past medical history. History  Substance Use Topics  . Smoking status: Current Every Day Smoker -- 0.50 packs/day    Types: Cigarettes  . Smokeless tobacco: Not on file  . Alcohol Use: 17.5 oz/week    35 drink(s) per week   ROS as above Medications: No current facility-administered medications for this encounter.   Current Outpatient Prescriptions  Medication Sig Dispense Refill  . Aspirin-Acetaminophen (GOODYS BODY PAIN PO) Take by mouth.      Marland Kitchen acetaminophen (TYLENOL) 325 MG tablet Take 650 mg by mouth every 6 (six) hours as needed.        Marland Kitchen amoxicillin (AMOXIL) 500 MG capsule Take 2 capsules (1,000 mg total) by mouth 2 (two) times daily.  40 capsule  0  . omeprazole (PRILOSEC) 20 MG capsule Take 1 capsule (20 mg total) by mouth daily.  30 capsule  0    Exam:  BP 131/98  Pulse 92  Temp(Src) 97.5 F (36.4 C) (Oral)  Resp 14  SpO2 98% Gen: Well NAD HEENT: EOMI,  MMM Lungs: Normal work of breathing. CTABL Heart: RRR no MRG Abd: NABS, Soft. Nondistended, Nontender Exts: Brisk capillary refill, warm and well perfused.   Results for orders placed during the hospital encounter of 11/27/13 (from the past 24 hour(s))  POCT I-STAT, CHEM 8     Status: Abnormal   Collection Time    11/27/13  9:08 AM      Result Value Ref Range   Sodium 133 (*) 137 - 147 mEq/L   Potassium 4.1  3.7 - 5.3 mEq/L   Chloride 97  96 - 112 mEq/L   BUN 15  6 - 23 mg/dL   Creatinine, Ser 7.90  0.50 - 1.35 mg/dL   Glucose, Bld 383  (*) 70 - 99 mg/dL   Calcium, Ion 3.38  3.29 - 1.23 mmol/L   TCO2 24  0 - 100 mmol/L   Hemoglobin 18.7 (*) 13.0 - 17.0 g/dL   HCT 19.1 (*) 66.0 - 60.0 %   No results found.  Assessment and Plan: 33 y.o. male with new diagnosis of diabetes with hyperglycemia. Patient does not yet need criteria for DKA or HONK. Plan to transfer to the emergency department for evaluation and management.  Discussed warning signs or symptoms. Please see discharge instructions. Patient expresses understanding.     Rodolph Bong, MD 11/27/13 [phone]

## 2013-11-28 NOTE — Progress Notes (Signed)
CM followed up with [facility] to secure patient an initial appointment for follow up new onset DM. This CM contacted [facility] and was informed that the patient had already come in to the clinic and has an initial appointment set up for Tuesday to establish care with a PCP. Ferdinand Cava, RN, BSN, Case Manager 11/28/2013 9:19 AM

## 2013-11-29 NOTE — ED Provider Notes (Signed)
Medical screening examination/treatment/procedure(s) were performed by non-physician practitioner and as supervising physician I was immediately available for consultation/collaboration.   EKG Interpretation None        Gilda Crease, MD 11/29/13 [phone]

## 2013-11-29 NOTE — ED Provider Notes (Signed)
Medical screening examination/treatment/procedure(s) were performed by a resident physician or non-physician practitioner and as the supervising physician I was immediately available for consultation/collaboration.  Clementeen Graham, MD    Rodolph Bong, MD 11/29/13 [phone]

## 2013-12-02 ENCOUNTER — Ambulatory Visit: Payer: Self-pay | Attending: Internal Medicine | Admitting: Internal Medicine

## 2013-12-02 ENCOUNTER — Encounter: Payer: Self-pay | Admitting: Internal Medicine

## 2013-12-02 VITALS — BP 118/76 | HR 67 | Temp 98.1°F | Resp 16

## 2013-12-02 DIAGNOSIS — Z794 Long term (current) use of insulin: Secondary | ICD-10-CM | POA: Insufficient documentation

## 2013-12-02 DIAGNOSIS — R739 Hyperglycemia, unspecified: Secondary | ICD-10-CM

## 2013-12-02 DIAGNOSIS — Z87891 Personal history of nicotine dependence: Secondary | ICD-10-CM | POA: Insufficient documentation

## 2013-12-02 DIAGNOSIS — R7309 Other abnormal glucose: Secondary | ICD-10-CM

## 2013-12-02 DIAGNOSIS — F1210 Cannabis abuse, uncomplicated: Secondary | ICD-10-CM | POA: Insufficient documentation

## 2013-12-02 DIAGNOSIS — E119 Type 2 diabetes mellitus without complications: Secondary | ICD-10-CM | POA: Insufficient documentation

## 2013-12-02 LAB — LIPID PANEL
CHOL/HDL RATIO: 2.4 ratio
CHOLESTEROL: 93 mg/dL (ref 0–200)
HDL: 38 mg/dL — AB (ref >39)
LDL Cholesterol: 31 mg/dL (ref 0–99)
Triglycerides: 122 mg/dL (ref <150)
VLDL: 24 mg/dL (ref 0–40)

## 2013-12-02 LAB — POCT URINALYSIS DIPSTICK
Glucose, UA: 500
Ketones, UA: 15
LEUKOCYTES UA: NEGATIVE
NITRITE UA: NEGATIVE
PH UA: 5.5
Spec Grav, UA: 1.02
Urobilinogen, UA: 0.2

## 2013-12-02 LAB — GLUCOSE, POCT (MANUAL RESULT ENTRY)
POC Glucose: 339 mg/dl — AB (ref 70–99)
POC Glucose: 399 mg/dl — AB (ref 70–99)

## 2013-12-02 LAB — POCT GLYCOSYLATED HEMOGLOBIN (HGB A1C): Hemoglobin A1C: 10.6

## 2013-12-02 MED ORDER — FREESTYLE LANCETS MISC: Status: DC

## 2013-12-02 MED ORDER — INSULIN ASPART 100 UNIT/ML ~~LOC~~ SOLN
10.0000 [IU] | Freq: Once | SUBCUTANEOUS | Status: AC
  Administered 2013-12-02: 10 [IU] via SUBCUTANEOUS

## 2013-12-02 MED ORDER — GLUCOSE BLOOD VI STRP: ORAL_STRIP | Status: DC

## 2013-12-02 MED ORDER — METFORMIN HCL 1000 MG PO TABS: 1000.0000 mg | ORAL_TABLET | Freq: Two times a day (BID) | ORAL | Status: DC

## 2013-12-02 NOTE — Progress Notes (Signed)
Patient ID: [name], male   DOB: 08/18/1980, 33 y.o.   MRN: [id number]   David [name], is a 33 y.o. male  OHK:[id number]  TCY:[id number]  DOB - 03-30-1980  CC:  Chief Complaint  Patient presents with  . Hospitalization Follow-up  . Diabetes  . Establish Care       HPI: David [name] is a 33 y.o. male here today to establish medical care. Patient has no significant past medical history except that he was recently diagnosed with diabetes after presenting to the ED with nausea, fatigue, polyuria, polydipsia and visual changes for several days. Blood sugar was 409, patient was managed appropriately and started on metformin, discharged to be followed up in the clinic today. His hemoglobin A1c is 10.6%. Patient has no new complaints today. Patient has not taken his metformin today, he reports some side effects including crampy abdominal pain and diarrhea. He has been counseled that this will resolve in about 2 weeks otherwise medication can be changed. Patient has No headache, No chest pain, No new weakness tingling or numbness, No Cough - SOB.  No Known Allergies Past Medical History  Diagnosis Date  . Diabetes mellitus without complication    Current Outpatient Prescriptions on File Prior to Visit  Medication Sig Dispense Refill  . acetaminophen (TYLENOL) 500 MG tablet Take 500 mg by mouth every 6 (six) hours as needed for moderate pain.      . Aspirin-Acetaminophen (GOODYS BODY PAIN PO) Take 1 packet by mouth daily as needed (for pain).        No current facility-administered medications on file prior to visit.   Family History  Problem Relation Age of Onset  . Diabetes Father    History   Social History  . Marital Status: Married    Spouse Name: N/A    Number of Children: N/A  . Years of Education: N/A   Occupational History  . Not on file.   Social History Main Topics  . Smoking status: Former Smoker -- 0.50 packs/day    Quit date: 10/27/2013  . Smokeless  tobacco: Not on file  . Alcohol Use: 17.5 oz/week    35 drink(s) per week  . Drug Use: No     Comment: Pt used to smoke marijuana   . Sexual Activity: Yes    Birth Control/ Protection: Condom   Other Topics Concern  . Not on file   Social History Narrative  . No narrative on file    Review of Systems: Constitutional: Negative for fever, chills, diaphoresis, activity change, appetite change and fatigue. HENT: Negative for ear pain, nosebleeds, congestion, facial swelling, rhinorrhea, neck pain, neck stiffness and ear discharge.  Eyes: Negative for pain, discharge, redness, itching and visual disturbance. Respiratory: Negative for cough, choking, chest tightness, shortness of breath, wheezing and stridor.  Cardiovascular: Negative for chest pain, palpitations and leg swelling. Gastrointestinal: Negative for abdominal distention. Genitourinary: Negative for dysuria, urgency, frequency, hematuria, flank pain, decreased urine volume, difficulty urinating and dyspareunia.  Musculoskeletal: Negative for back pain, joint swelling, arthralgia and gait problem. Neurological: Negative for dizziness, tremors, seizures, syncope, facial asymmetry, speech difficulty, weakness, light-headedness, numbness and headaches.  Hematological: Negative for adenopathy. Does not bruise/bleed easily. Psychiatric/Behavioral: Negative for hallucinations, behavioral problems, confusion, dysphoric mood, decreased concentration and agitation.    Objective:   Filed Vitals:   12/02/13 1018  BP: 118/76  Pulse: 67  Temp: 98.1 F (36.7 C)  Resp: 16    Physical Exam: Constitutional: Patient appears well-developed  and well-nourished. No distress. HENT: Normocephalic, atraumatic, External right and left ear normal. Oropharynx is clear and moist.  Eyes: Conjunctivae and EOM are normal. PERRLA, no scleral icterus. Neck: Normal ROM. Neck supple. No JVD. No tracheal deviation. No thyromegaly. CVS: RRR, S1/S2 +, no  murmurs, no gallops, no carotid bruit.  Pulmonary: Effort and breath sounds normal, no stridor, rhonchi, wheezes, rales.  Abdominal: Soft. BS +, no distension, tenderness, rebound or guarding.  Musculoskeletal: Normal range of motion. No edema and no tenderness.  Lymphadenopathy: No lymphadenopathy noted, cervical, inguinal or axillary Neuro: Alert. Normal reflexes, muscle tone coordination. No cranial nerve deficit. Skin: Skin is warm and dry. No rash noted. Not diaphoretic. No erythema. No pallor. Psychiatric: Normal mood and affect. Behavior, judgment, thought content normal.  Lab Results  Component Value Date   WBC 8.0 11/27/2013   HGB 16.4 11/27/2013   HCT 46.7 11/27/2013   MCV 80.7 11/27/2013   PLT 281 11/27/2013   Lab Results  Component Value Date   CREATININE 0.93 11/27/2013   BUN 15 11/27/2013   NA 136* 11/27/2013   K 4.6 11/27/2013   CL 96 11/27/2013   CO2 24 11/27/2013    No results found for this basename: HGBA1C   Lipid Panel  No results found for this basename: chol, trig, hdl, cholhdl, vldl, ldlcalc       Assessment and plan:   1. Type 2 diabetes mellitus without complication: New Onset  - Urinalysis Dipstick - Glucose (CBG) - HgB A1c - Proinsulin/insulin ratio - Insulin-free, total and bound), blood - C-peptide - Insulin antibody - Lipid panel  - metFORMIN (GLUCOPHAGE) 1000 MG tablet; Take 1 tablet (1,000 mg total) by mouth 2 (two) times daily with a meal.  Dispense: 180 tablet; Refill: 3 - DME Glucometer; Standing - Lancets (FREESTYLE) lancets; Use as instructed  Dispense: 100 each; Refill: 12 - glucose blood (ACCU-CHEK AVIVA) test strip; Use as instructed  Dispense: 100 each; Refill: 12 - DME Glucometer  2. Hyperglycemia  - insulin aspart (novoLOG) injection 10 Units; Inject 0.1 mLs (10 Units total) into the skin once.   Aim for 2-3 Carb Choices per meal (30-45 grams) +/- 1 either way  Aim for 0-15 Carbs per snack if hungry  Include protein in  moderation with your meals and snacks  Consider reading food labels for Total Carbohydrate and Fat Grams of foods  Consider checking BG at alternate times per day  Continue taking medication as directed Fruit Punch - find one with no sugar  Measure and decrease portions of carbohydrate foods  Make your plate and don't go back for seconds   Return in about 1 week (around 12/09/2013), or if symptoms worsen or fail to improve, for CBG, Lab/Nurse Visit.  The patient was given clear instructions to go to ER or return to medical center if symptoms don't improve, worsen or new problems develop. The patient verbalized understanding. The patient was told to call to get lab results if they haven't heard anything in the next week.     This note has been created with Education officer, environmentalDragon speech recognition software and smart phrase technology. Any transcriptional errors are unintentional.    Jeanann LewandowskyJEGEDE, Mark Hassey, MD, MHA, FACP, FAAP [facility] [place], [place] [phone]   12/02/2013, 10:33 AM

## 2013-12-02 NOTE — Progress Notes (Signed)
Pt here to establish care for new diagnosis DM Type 2 in ER 11/27/13 Pt was seen @ [facility] hospital ER for hyperglycemia and started Pt states he didn't take Metformin this morning because he got up late CBG- 399 10 units given per protocol Pt c/o side effects to taking Metformin with abdominal cramping,diarrhea - informed this will resolve in 2 weeks Education given for DM control

## 2013-12-02 NOTE — Patient Instructions (Signed)
Diabetes and Exercise Exercising regularly is important. It is not just about losing weight. It has many health benefits, such as:  Improving your overall fitness, flexibility, and endurance.  Increasing your bone density.  Helping with weight control.  Decreasing your body fat.  Increasing your muscle strength.  Reducing stress and tension.  Improving your overall health. People with diabetes who exercise gain additional benefits because exercise:  Reduces appetite.  Improves the body's use of blood sugar (glucose).  Helps lower or control blood glucose.  Decreases blood pressure.  Helps control blood lipids (such as cholesterol and triglycerides).  Improves the body's use of the hormone insulin by:  Increasing the body's insulin sensitivity.  Reducing the body's insulin needs.  Decreases the risk for heart disease because exercising:  Lowers cholesterol and triglycerides levels.  Increases the levels of good cholesterol (such as high-density lipoproteins [HDL]) in the body.  Lowers blood glucose levels. YOUR ACTIVITY PLAN  Choose an activity that you enjoy and set realistic goals. Your health care provider or diabetes educator can help you make an activity plan that works for you. Exercise regularly as directed by your health care provider. This includes:  Performing resistance training twice a week such as push-ups, sit-ups, lifting weights, or using resistance bands.  Performing 150 minutes of cardio exercises each week such as walking, running, or playing sports.  Staying active and spending no more than 90 minutes at one time being inactive. Even short bursts of exercise are good for you. Three 10-minute sessions spread throughout the day are just as beneficial as a single 30-minute session. Some exercise ideas include:  Taking the dog for a walk.  Taking the stairs instead of the elevator.  Dancing to your favorite song.  Doing an exercise  video.  Doing your favorite exercise with a friend. RECOMMENDATIONS FOR EXERCISING WITH TYPE 1 OR TYPE 2 DIABETES   Check your blood glucose before exercising. If blood glucose levels are greater than 240 mg/dL, check for urine ketones. Do not exercise if ketones are present.  Avoid injecting insulin into areas of the body that are going to be exercised. For example, avoid injecting insulin into:  The arms when playing tennis.  The legs when jogging.  Keep a record of:  Food intake before and after you exercise.  Expected peak times of insulin action.  Blood glucose levels before and after you exercise.  The type and amount of exercise you have done.  Review your records with your health care provider. Your health care provider will help you to develop guidelines for adjusting food intake and insulin amounts before and after exercising.  If you take insulin or oral hypoglycemic agents, watch for signs and symptoms of hypoglycemia. They include:  Dizziness.  Shaking.  Sweating.  Chills.  Confusion.  Drink plenty of water while you exercise to prevent dehydration or heat stroke. Body water is lost during exercise and must be replaced.  Talk to your health care provider before starting an exercise program to make sure it is safe for you. Remember, almost any type of activity is better than none. Document Released: 05/13/2003 Document Revised: 07/07/2013 Document Reviewed: 07/30/2012 ExitCare Patient Information 2015 [employer]. This information is not intended to replace advice given to you by your health care provider. Make sure you discuss any questions you have with your health care provider. Diabetes Mellitus and Food It is important for you to manage your blood sugar (glucose) level. Your blood glucose level can   be greatly affected by what you eat. Eating healthier foods in the appropriate amounts throughout the day at about the same time each day will help you  control your blood glucose level. It can also help slow or prevent worsening of your diabetes mellitus. Healthy eating may even help you improve the level of your blood pressure and reach or maintain a healthy weight.  HOW CAN FOOD AFFECT ME? Carbohydrates Carbohydrates affect your blood glucose level more than any other type of food. Your dietitian will help you determine how many carbohydrates to eat at each meal and teach you how to count carbohydrates. Counting carbohydrates is important to keep your blood glucose at a healthy level, especially if you are using insulin or taking certain medicines for diabetes mellitus. Alcohol Alcohol can cause sudden decreases in blood glucose (hypoglycemia), especially if you use insulin or take certain medicines for diabetes mellitus. Hypoglycemia can be a life-threatening condition. Symptoms of hypoglycemia (sleepiness, dizziness, and disorientation) are similar to symptoms of having too much alcohol.  If your health care provider has given you approval to drink alcohol, do so in moderation and use the following guidelines:  Women should not have more than one drink per day, and men should not have more than two drinks per day. One drink is equal to:  12 oz of beer.  5 oz of wine.  1 oz of hard liquor.  Do not drink on an empty stomach.  Keep yourself hydrated. Have water, diet soda, or unsweetened iced tea.  Regular soda, juice, and other mixers might contain a lot of carbohydrates and should be counted. WHAT FOODS ARE NOT RECOMMENDED? As you make food choices, it is important to remember that all foods are not the same. Some foods have fewer nutrients per serving than other foods, even though they might have the same number of calories or carbohydrates. It is difficult to get your body what it needs when you eat foods with fewer nutrients. Examples of foods that you should avoid that are high in calories and carbohydrates but low in nutrients  include:  Trans fats (most processed foods list trans fats on the Nutrition Facts label).  Regular soda.  Juice.  Candy.  Sweets, such as cake, pie, doughnuts, and cookies.  Fried foods. WHAT FOODS CAN I EAT? Have nutrient-rich foods, which will nourish your body and keep you healthy. The food you should eat also will depend on several factors, including:  The calories you need.  The medicines you take.  Your weight.  Your blood glucose level.  Your blood pressure level.  Your cholesterol level. You also should eat a variety of foods, including:  Protein, such as meat, poultry, fish, tofu, nuts, and seeds (lean animal proteins are best).  Fruits.  Vegetables.  Dairy products, such as milk, cheese, and yogurt (low fat is best).  Breads, grains, pasta, cereal, rice, and beans.  Fats such as olive oil, trans fat-free margarine, canola oil, avocado, and olives. DOES EVERYONE WITH DIABETES MELLITUS HAVE THE SAME MEAL PLAN? Because every person with diabetes mellitus is different, there is not one meal plan that works for everyone. It is very important that you meet with a dietitian who will help you create a meal plan that is just right for you. Document Released: 11/17/2004 Document Revised: 02/25/2013 Document Reviewed: 01/17/2013 ExitCare Patient Information 2015 [employer]. This information is not intended to replace advice given to you by your health care provider. Make sure you discuss any   questions you have with your health care provider.  

## 2013-12-03 LAB — C-PEPTIDE: C-Peptide: 3.12 ng/mL (ref 0.80–3.90)

## 2013-12-07 LAB — INSULIN ANTIBODIES, BLOOD: Insulin Antibodies, Human: <0.4 U/mL

## 2013-12-09 ENCOUNTER — Ambulatory Visit: Payer: Self-pay | Attending: Internal Medicine | Admitting: *Deleted

## 2013-12-09 ENCOUNTER — Encounter: Payer: Self-pay | Admitting: *Deleted

## 2013-12-09 ENCOUNTER — Encounter: Payer: Self-pay | Attending: Internal Medicine | Admitting: *Deleted

## 2013-12-09 VITALS — Ht 67.0 in | Wt 192.8 lb

## 2013-12-09 DIAGNOSIS — Z23 Encounter for immunization: Secondary | ICD-10-CM | POA: Insufficient documentation

## 2013-12-09 DIAGNOSIS — E119 Type 2 diabetes mellitus without complications: Secondary | ICD-10-CM | POA: Insufficient documentation

## 2013-12-09 DIAGNOSIS — Z713 Dietary counseling and surveillance: Secondary | ICD-10-CM | POA: Insufficient documentation

## 2013-12-09 LAB — PROINSULIN/INSULIN RATIO
Insulin: 114 u[IU]/mL — ABNORMAL HIGH (ref 3–19)
Proinsulin/Insulin Ratio: 1.8 % (ref 0.8–21.7)
Proinsulin: 12 pmol/L — ABNORMAL HIGH (ref <=8.0)

## 2013-12-09 LAB — GLUCOSE, POCT (MANUAL RESULT ENTRY): POC GLUCOSE: 256 mg/dL — AB (ref 70–99)

## 2013-12-09 NOTE — Patient Instructions (Signed)
Diabetes Mellitus and Food It is important for you to manage your blood sugar (glucose) level. Your blood glucose level can be greatly affected by what you eat. Eating healthier foods in the appropriate amounts throughout the day at about the same time each day will help you control your blood glucose level. It can also help slow or prevent worsening of your diabetes mellitus. Healthy eating may even help you improve the level of your blood pressure and reach or maintain a healthy weight.  HOW CAN FOOD AFFECT ME? Carbohydrates Carbohydrates affect your blood glucose level more than any other type of food. Your dietitian will help you determine how many carbohydrates to eat at each meal and teach you how to count carbohydrates. Counting carbohydrates is important to keep your blood glucose at a healthy level, especially if you are using insulin or taking certain medicines for diabetes mellitus. Alcohol Alcohol can cause sudden decreases in blood glucose (hypoglycemia), especially if you use insulin or take certain medicines for diabetes mellitus. Hypoglycemia can be a life-threatening condition. Symptoms of hypoglycemia (sleepiness, dizziness, and disorientation) are similar to symptoms of having too much alcohol.  If your health care provider has given you approval to drink alcohol, do so in moderation and use the following guidelines:  Women should not have more than one drink per day, and men should not have more than two drinks per day. One drink is equal to:  12 oz of beer.  5 oz of wine.  1 oz of hard liquor.  Do not drink on an empty stomach.  Keep yourself hydrated. Have water, diet soda, or unsweetened iced tea.  Regular soda, juice, and other mixers might contain a lot of carbohydrates and should be counted. WHAT FOODS ARE NOT RECOMMENDED? As you make food choices, it is important to remember that all foods are not the same. Some foods have fewer nutrients per serving than other  foods, even though they might have the same number of calories or carbohydrates. It is difficult to get your body what it needs when you eat foods with fewer nutrients. Examples of foods that you should avoid that are high in calories and carbohydrates but low in nutrients include:  Trans fats (most processed foods list trans fats on the Nutrition Facts label).  Regular soda.  Juice.  Candy.  Sweets, such as cake, pie, doughnuts, and cookies.  Fried foods. WHAT FOODS CAN I EAT? Have nutrient-rich foods, which will nourish your body and keep you healthy. The food you should eat also will depend on several factors, including:  The calories you need.  The medicines you take.  Your weight.  Your blood glucose level.  Your blood pressure level.  Your cholesterol level. You also should eat a variety of foods, including:  Protein, such as meat, poultry, fish, tofu, nuts, and seeds (lean animal proteins are best).  Fruits.  Vegetables.  Dairy products, such as milk, cheese, and yogurt (low fat is best).  Breads, grains, pasta, cereal, rice, and beans.  Fats such as olive oil, trans fat-free margarine, canola oil, avocado, and olives. DOES EVERYONE WITH DIABETES MELLITUS HAVE THE SAME MEAL PLAN? Because every person with diabetes mellitus is different, there is not one meal plan that works for everyone. It is very important that you meet with a dietitian who will help you create a meal plan that is just right for you. Document Released: 11/17/2004 Document Revised: 02/25/2013 Document Reviewed: 01/17/2013 ExitCare Patient Information 2015 [employer]. This   information is not intended to replace advice given to you by your health care provider. Make sure you discuss any questions you have with your health care provider. Diabetes and Exercise Exercising regularly is important. It is not just about losing weight. It has many health benefits, such as:  Improving your overall  fitness, flexibility, and endurance.  Increasing your bone density.  Helping with weight control.  Decreasing your body fat.  Increasing your muscle strength.  Reducing stress and tension.  Improving your overall health. People with diabetes who exercise gain additional benefits because exercise:  Reduces appetite.  Improves the body's use of blood sugar (glucose).  Helps lower or control blood glucose.  Decreases blood pressure.  Helps control blood lipids (such as cholesterol and triglycerides).  Improves the body's use of the hormone insulin by:  Increasing the body's insulin sensitivity.  Reducing the body's insulin needs.  Decreases the risk for heart disease because exercising:  Lowers cholesterol and triglycerides levels.  Increases the levels of good cholesterol (such as high-density lipoproteins [HDL]) in the body.  Lowers blood glucose levels. YOUR ACTIVITY PLAN  Choose an activity that you enjoy and set realistic goals. Your health care provider or diabetes educator can help you make an activity plan that works for you. Exercise regularly as directed by your health care provider. This includes:  Performing resistance training twice a week such as push-ups, sit-ups, lifting weights, or using resistance bands.  Performing 150 minutes of cardio exercises each week such as walking, running, or playing sports.  Staying active and spending no more than 90 minutes at one time being inactive. Even short bursts of exercise are good for you. Three 10-minute sessions spread throughout the day are just as beneficial as a single 30-minute session. Some exercise ideas include:  Taking the dog for a walk.  Taking the stairs instead of the elevator.  Dancing to your favorite song.  Doing an exercise video.  Doing your favorite exercise with a friend. RECOMMENDATIONS FOR EXERCISING WITH TYPE 1 OR TYPE 2 DIABETES   Check your blood glucose before exercising. If  blood glucose levels are greater than 240 mg/dL, check for urine ketones. Do not exercise if ketones are present.  Avoid injecting insulin into areas of the body that are going to be exercised. For example, avoid injecting insulin into:  The arms when playing tennis.  The legs when jogging.  Keep a record of:  Food intake before and after you exercise.  Expected peak times of insulin action.  Blood glucose levels before and after you exercise.  The type and amount of exercise you have done.  Review your records with your health care provider. Your health care provider will help you to develop guidelines for adjusting food intake and insulin amounts before and after exercising.  If you take insulin or oral hypoglycemic agents, watch for signs and symptoms of hypoglycemia. They include:  Dizziness.  Shaking.  Sweating.  Chills.  Confusion.  Drink plenty of water while you exercise to prevent dehydration or heat stroke. Body water is lost during exercise and must be replaced.  Talk to your health care provider before starting an exercise program to make sure it is safe for you. Remember, almost any type of activity is better than none. Document Released: 05/13/2003 Document Revised: 07/07/2013 Document Reviewed: 07/30/2012 ExitCare Patient Information 2015 [employer]. This information is not intended to replace advice given to you by your health care provider. Make sure you discuss any   questions you have with your health care provider.  

## 2013-12-09 NOTE — Progress Notes (Signed)
Patient arrived with family for CBG and record review CBG 256  Patient states that he did not take metformin yesterday because numbers were better. Explained to patient that metformin needs to be taken twice daily with food regardless of CBG reading Patient states he will take metformin as directed without skipping doses from now on. Discussed with provider and no insulin to be given at this time. Record shows numbers ranging 115-266 over last 3 days. Numbers had been ranging closer to 400 prior to that. Patient given information on Diabetes and Food and Diabetes and Exercise Patient given flu vaccine. Patient to return in 2 weeks for nurse visit for CBG and record review. Patient states that he has an appt today at 67 for Diabetic Education

## 2013-12-09 NOTE — Patient Instructions (Signed)
Plan:  Aim for 3 Carb Choices per meal (45 grams) +/- 1 either way  Aim for 0-2 Carbs per snack if hungry  Include protein in moderation with your meals and snacks Consider reading food labels for Total Carbohydrate of foods Continue checking BG at alternate times per day as directed by MD  Continue taking medication of Metformin after breakfast and supper as directed by MD

## 2013-12-17 NOTE — Progress Notes (Signed)
Diabetes Self-Management Education  Visit Type:  Initial  Appt. Start Time: 1015 Appt. End Time: 1145  12/17/2013  Mr. David [name], identified by name and date of birth, is a 33 y.o. male with a diagnosis of Diabetes: Type 2.  Other people present during visit:  Patient;Spouse/SO   ASSESSMENT  Height 5\' 7"  (1.702 m), weight 192 lb 12.8 oz (87.454 kg). Body mass index is 30.19 kg/(m^2).  Initial Visit Information:  Are you currently following a meal plan?: No   Are you taking your medications as prescribed?: Yes Are you checking your feet?: No        Psychosocial:     Self-care barriers: None Self-management support: Family Other persons present: Patient;Spouse/SO Patient Concerns: Nutrition/Meal planning Preferred Learning Style: Auditory  Complications:   Last HgB A1C per patient/outside source: 10.6 % How often do you check your blood sugar?: 3-4 times/day Fasting Blood glucose range (mg/dL): >945;038-882 Postprandial Blood glucose range (mg/dL): 800-349;17-915 Have you had a dilated eye exam in the past 12 months?: No Have you had a dental exam in the past 12 months?: No  Diet Intake:  Breakfast: grits, bologna, [place] bacon, water OR [place] sausage, boiled egg OR would like to eat cereal, pancakes etc if he can Snack (morning): oatmeal cookie Lunch: salad lately, would like to have cheese burger and fries, diet soda Snack (afternoon): same as AM Dinner: meat, starch, vegetables, salad, bread, (used to eat second plate, not now) water now Snack (evening): not since diagnosis, used to have chips and dip Beverage(s): water, diet soda  Exercise:  Exercise: ADL's (has a new puppy, plans to walk him)  Individualized Plan for Diabetes Self-Management Training:   Learning Objective:  Patient will have a greater understanding of diabetes self-management.  Patient education plan per assessed needs and concerns is to attend individual sessions for      Education Topics Reviewed with Patient Today:  Definition of diabetes, type 1 and 2, and the diagnosis of diabetes Role of diet in the treatment of diabetes and the relationship between the three main macronutrients and blood glucose level;Carbohydrate counting;Food label reading, portion sizes and measuring food. Role of exercise on diabetes management, blood pressure control and cardiac health. Reviewed patients medication for diabetes, action, purpose, timing of dose and side effects. Identified appropriate SMBG and/or A1C goals. Taught treatment of hypoglycemia - the 15 rule.          PATIENTS GOALS/Plan (Developed by the patient):  Nutrition: Follow meal plan discussed Physical Activity: 15 minutes per day Medications: take my medication as prescribed Monitoring : test blood glucose pre and post meals as discussed  Plan:   Patient Instructions  Plan:  Aim for 3 Carb Choices per meal (45 grams) +/- 1 either way  Aim for 0-2 Carbs per snack if hungry  Include protein in moderation with your meals and snacks Consider reading food labels for Total Carbohydrate of foods Continue checking BG at alternate times per day as directed by MD  Continue taking medication of Metformin after breakfast and supper as directed by MD      Expected Outcomes:  Demonstrated limited interest in learning.  Expect minimal changes  Education material provided: Living Well with Diabetes, Food label handouts, A1C conversion sheet, Meal plan card and Carbohydrate counting sheet  If problems or questions, patient to contact team via:  Phone  Future DSME appointment: 4-6 wks

## 2013-12-19 ENCOUNTER — Ambulatory Visit: Payer: Self-pay

## 2013-12-19 ENCOUNTER — Telehealth: Payer: Self-pay | Admitting: Emergency Medicine

## 2013-12-19 NOTE — Telephone Encounter (Signed)
Message copied by Darlis Loan on Fri Dec 19, 2013  3:00 PM ------      Message from: Jeanann Lewandowsky E      Created: Wed Dec 17, 2013  5:33 PM       Please inform patient that his laboratory tests results are mostly within normal limit. His diabetes is type II. ------

## 2013-12-19 NOTE — Telephone Encounter (Signed)
Pt given lab results 

## 2013-12-23 ENCOUNTER — Ambulatory Visit: Payer: Self-pay | Attending: Internal Medicine | Admitting: *Deleted

## 2013-12-23 DIAGNOSIS — E1165 Type 2 diabetes mellitus with hyperglycemia: Secondary | ICD-10-CM

## 2013-12-23 DIAGNOSIS — K088 Other specified disorders of teeth and supporting structures: Secondary | ICD-10-CM | POA: Insufficient documentation

## 2013-12-23 DIAGNOSIS — K0889 Other specified disorders of teeth and supporting structures: Secondary | ICD-10-CM

## 2013-12-23 LAB — GLUCOSE, POCT (MANUAL RESULT ENTRY): POC Glucose: 241 mg/dl — AB (ref 70–99)

## 2013-12-23 MED ORDER — DAPAGLIFLOZIN PROPANEDIOL 5 MG PO TABS: 5.0000 mg | ORAL_TABLET | Freq: Every morning | ORAL | Status: DC

## 2013-12-23 NOTE — Progress Notes (Unsigned)
Patient presents with wife and children for CBG check and record review However, patient forgot to bring record of home CBG readings Asked patient to bring with him to next visit States CBG at home have been ranging 141-273 States he has been taking metformin twice a day but did not take this AM due to toothache States left upper tooth pain for 2 months; rates 8/10 at present States pain is relieved with goody powder  CBG 241  Per provider, farxiga 5 mg daily added to regimen. Rx e-scribed to [facility]. Patient will return in 2 weeks for nurse visit to recheck CBG after starting new med. Dental referral placed and patient given list of low cost dental clinics

## 2014-01-06 ENCOUNTER — Other Ambulatory Visit: Payer: Self-pay

## 2014-01-13 ENCOUNTER — Ambulatory Visit: Payer: Self-pay

## 2014-01-20 ENCOUNTER — Ambulatory Visit: Payer: Self-pay | Admitting: *Deleted

## 2014-05-28 ENCOUNTER — Telehealth: Payer: Self-pay | Admitting: Internal Medicine

## 2014-05-28 NOTE — Telephone Encounter (Signed)
Pt's family member called requesting an appt for PCP , family member states pt's blood sugar has been high for the past 2 days. He would like to speak to nurse . Please f/u with pt           

## 2014-06-05 NOTE — Telephone Encounter (Signed)
Pt's family member called requesting an appt for PCP , family member states pt's blood sugar has been high for the past 2 days. He would like to speak to nurse . Please f/u with pt

## 2014-06-11 ENCOUNTER — Emergency Department (HOSPITAL_COMMUNITY)
Admission: EM | Admit: 2014-06-11 | Discharge: 2014-06-11 | Disposition: A | Discharge status: Home / Self Care | Payer: Self-pay | Attending: Emergency Medicine | Admitting: Emergency Medicine

## 2014-06-11 ENCOUNTER — Encounter (HOSPITAL_COMMUNITY): Payer: Self-pay | Admitting: Emergency Medicine

## 2014-06-11 DIAGNOSIS — H539 Unspecified visual disturbance: Secondary | ICD-10-CM | POA: Insufficient documentation

## 2014-06-11 DIAGNOSIS — R739 Hyperglycemia, unspecified: Secondary | ICD-10-CM

## 2014-06-11 DIAGNOSIS — R197 Diarrhea, unspecified: Secondary | ICD-10-CM | POA: Insufficient documentation

## 2014-06-11 DIAGNOSIS — R000 Tachycardia, unspecified: Secondary | ICD-10-CM | POA: Insufficient documentation

## 2014-06-11 DIAGNOSIS — E1165 Type 2 diabetes mellitus with hyperglycemia: Secondary | ICD-10-CM | POA: Insufficient documentation

## 2014-06-11 DIAGNOSIS — Z87891 Personal history of nicotine dependence: Secondary | ICD-10-CM | POA: Insufficient documentation

## 2014-06-11 DIAGNOSIS — Z79899 Other long term (current) drug therapy: Secondary | ICD-10-CM | POA: Insufficient documentation

## 2014-06-11 LAB — CBC WITH DIFFERENTIAL/PLATELET
BASOS ABS: 0 10*3/uL (ref 0.0–0.1)
Basophils Relative: 0 % (ref 0–1)
EOS ABS: 0.1 10*3/uL (ref 0.0–0.7)
EOS PCT: 1 % (ref 0–5)
HCT: 44.8 % (ref 39.0–52.0)
HEMOGLOBIN: 15.5 g/dL (ref 13.0–17.0)
Lymphocytes Relative: 36 % (ref 12–46)
Lymphs Abs: 2.7 10*3/uL (ref 0.7–4.0)
MCH: 28.1 pg (ref 26.0–34.0)
MCHC: 34.6 g/dL (ref 30.0–36.0)
MCV: 81.2 fL (ref 78.0–100.0)
Monocytes Absolute: 0.4 10*3/uL (ref 0.1–1.0)
Monocytes Relative: 6 % (ref 3–12)
Neutro Abs: 4.3 10*3/uL (ref 1.7–7.7)
Neutrophils Relative %: 57 % (ref 43–77)
PLATELETS: 288 10*3/uL (ref 150–400)
RBC: 5.52 MIL/uL (ref 4.22–5.81)
RDW: 12.7 % (ref 11.5–15.5)
WBC: 7.6 10*3/uL (ref 4.0–10.5)

## 2014-06-11 LAB — BASIC METABOLIC PANEL
Anion gap: 12 (ref 5–15)
BUN: 15 mg/dL (ref 6–23)
CHLORIDE: 96 mmol/L (ref 96–112)
CO2: 21 mmol/L (ref 19–32)
CREATININE: 1.24 mg/dL (ref 0.50–1.35)
Calcium: 9.1 mg/dL (ref 8.4–10.5)
GFR calc Af Amer: 88 mL/min — ABNORMAL LOW (ref >90)
GFR calc non Af Amer: 76 mL/min — ABNORMAL LOW (ref >90)
GLUCOSE: 544 mg/dL — AB (ref 70–99)
Potassium: 4.2 mmol/L (ref 3.5–5.1)
Sodium: 129 mmol/L — ABNORMAL LOW (ref 135–145)

## 2014-06-11 LAB — URINALYSIS, ROUTINE W REFLEX MICROSCOPIC
BILIRUBIN URINE: NEGATIVE
Glucose, UA: >1000 mg/dL — AB
HGB URINE DIPSTICK: NEGATIVE
Ketones, ur: 40 mg/dL — AB
Leukocytes, UA: NEGATIVE
Nitrite: NEGATIVE
Protein, ur: NEGATIVE mg/dL
Specific Gravity, Urine: 1.043 — ABNORMAL HIGH (ref 1.005–1.030)
Urobilinogen, UA: 0.2 mg/dL (ref 0.0–1.0)
pH: 5 (ref 5.0–8.0)

## 2014-06-11 LAB — CBG MONITORING, ED
Glucose-Capillary: 528 mg/dL — ABNORMAL HIGH (ref 70–99)
Glucose-Capillary: 429 mg/dL — ABNORMAL HIGH (ref 70–99)
Glucose-Capillary: 357 mg/dL — ABNORMAL HIGH (ref 70–99)

## 2014-06-11 LAB — LIPASE, BLOOD: LIPASE: 32 U/L (ref 11–59)

## 2014-06-11 LAB — URINE MICROSCOPIC-ADD ON

## 2014-06-11 MED ORDER — SODIUM CHLORIDE 0.9 % IV BOLUS (SEPSIS)
1000.0000 mL | Freq: Once | INTRAVENOUS | Status: AC
  Administered 2014-06-11: 1000 mL via INTRAVENOUS

## 2014-06-11 MED ORDER — METFORMIN HCL 500 MG PO TABS
500.0000 mg | ORAL_TABLET | Freq: Once | ORAL | Status: AC
  Administered 2014-06-11: 500 mg via ORAL
  Filled 2014-06-11: qty 1

## 2014-06-11 MED ORDER — ONDANSETRON HCL 4 MG/2ML IJ SOLN
4.0000 mg | Freq: Once | INTRAMUSCULAR | Status: AC
  Administered 2014-06-11: 4 mg via INTRAVENOUS
  Filled 2014-06-11: qty 2

## 2014-06-11 NOTE — ED Notes (Signed)
CBG 429. 

## 2014-06-11 NOTE — ED Provider Notes (Signed)
CSN: [id number]     Arrival date & time 06/11/14  1191 History   First MD Initiated Contact with Patient 06/11/14 [phone]     Chief Complaint  Patient presents with  . Hyperglycemia     (Consider location/radiation/quality/duration/timing/severity/associated sxs/prior Treatment) Patient is a 34 y.o. male presenting with hyperglycemia. The history is provided by the patient and a significant other. No language interpreter was used.  Hyperglycemia Associated symptoms: no dizziness   David [name] is a 34 y.o black male with a history of DM who presents for gradual onset blurry vision, polyuria, polydipsia, and nausea since for the past 2 days.  Nothing makes it better or worse.  He has no recent change in medications and takes metformin  BID.  He states he has been non compliant with his medications due to diarrhea. He has been drinking both alcohol and liquor in the past few days. He denies any fever, diarrhea, shortness of breath, chest pain, or vomiting.  He denies a history of DKA.  Past Medical History  Diagnosis Date  . Diabetes mellitus without complication    History reviewed. No pertinent past surgical history. Family History  Problem Relation Age of Onset  . Diabetes Father    History  Substance Use Topics  . Smoking status: Former Smoker -- 0.50 packs/day    Quit date: 10/27/2013  . Smokeless tobacco: Never Used  . Alcohol Use: 17.5 oz/week    35 drink(s) per week     Comment: has quit since diagnosed with Diabetes    Review of Systems  Eyes: Positive for visual disturbance.  Cardiovascular: Negative for leg swelling.  Gastrointestinal: Positive for diarrhea.  Neurological: Negative for dizziness, syncope, light-headedness and headaches.  All other systems reviewed and are negative.     Allergies  Review of patient's allergies indicates no known allergies.  Home Medications   Prior to Admission medications   Medication Sig Start Date End Date Taking?  Authorizing Provider  metFORMIN (GLUCOPHAGE) 1000 MG tablet Take 1 tablet (1,000 mg total) by mouth 2 (two) times daily with a meal. 12/02/13  Yes Quentin Angst, MD  acetaminophen (TYLENOL) 500 MG tablet Take 500 mg by mouth every 6 (six) hours as needed for moderate pain.    Historical Provider, MD  Aspirin-Acetaminophen (GOODYS BODY PAIN PO) Take 1 packet by mouth daily as needed (for pain).     Historical Provider, MD  Dapagliflozin Propanediol (FARXIGA) 5 MG TABS Take 5 mg by mouth every morning. Patient not taking: Reported on 06/11/2014 12/23/13   Quentin Angst, MD  glucose blood (ACCU-CHEK AVIVA) test strip Use as instructed 12/02/13   Quentin Angst, MD  Lancets (FREESTYLE) lancets Use as instructed 12/02/13   Quentin Angst, MD   BP 128/69 mmHg  Pulse 96  Temp(Src) 98 F (36.7 C) (Oral)  Resp 20  SpO2 96% Physical Exam  Constitutional: He is oriented to person, place, and time. He appears well-developed and well-nourished.  HENT:  Head: Normocephalic and atraumatic.  Mouth/Throat: Oropharynx is clear and moist and mucous membranes are normal.  Eyes: Conjunctivae are normal.  Neck: Normal range of motion. Neck supple.  Cardiovascular: Regular rhythm and normal heart sounds.  Tachycardia present.   Pulmonary/Chest: Effort normal and breath sounds normal. No respiratory distress.  Abdominal: Soft. Normal appearance. There is no tenderness. There is no CVA tenderness.  Musculoskeletal: Normal range of motion.  Neurological: He is alert and oriented to person, place, and time. He has normal  strength. No sensory deficit.  Skin: Skin is warm and dry.  Nursing note and vitals reviewed.   ED Course  Procedures (including critical care time) Labs Review Labs Reviewed  BASIC METABOLIC PANEL - Abnormal; Notable for the following:    Sodium 129 (*)    Glucose, Bld 544 (*)    GFR calc non Af Amer 76 (*)    GFR calc Af Amer 88 (*)    All other components within  normal limits  URINALYSIS, ROUTINE W REFLEX MICROSCOPIC - Abnormal; Notable for the following:    Specific Gravity, Urine 1.043 (*)    Glucose, UA >1000 (*)    Ketones, ur 40 (*)    All other components within normal limits  CBG MONITORING, ED - Abnormal; Notable for the following:    Glucose-Capillary 528 (*)    All other components within normal limits  CBG MONITORING, ED - Abnormal; Notable for the following:    Glucose-Capillary 429 (*)    All other components within normal limits  CBC WITH DIFFERENTIAL/PLATELET  LIPASE, BLOOD  URINE MICROSCOPIC-ADD ON    Imaging Review No results found.   EKG Interpretation   Date/Time:  Thursday June 11 2014 09:51:01 EDT Ventricular Rate:  82 PR Interval:  143 QRS Duration: 95 QT Interval:  392 QTC Calculation: 458 R Axis:   83 Text Interpretation:  Sinus rhythm Borderline T abnormalities, inferior  leads T wave inversions inferiorly Confirmed by HORTON  MD, David  ([id number]) on 06/11/2014 11:53:24 AM      MDM   Final diagnoses:  Hyperglycemia  Patient has a history of DM and presents with polydipsia, polyuria, nausea, and blurred vision for the past 2 days.  He is noncompliant with metformin.  He has been drinking both liquor and beer for the past couple of days.  On exam he has no abdominal pain, no dizziness, no weakness. I have given him 3 boluses of fluids and metformin.   11:30 CBG is 429.  12:04 I called his PCP to discuss changing his medication to insulin but did not get a call back. He does not have an anion gap and his bicarb is normal so I do not suspect DKA.  His vitals are now stable.   13:29 CBG is 357.  Patient states he is feeling better.  No nausea or vomiting while in ED.  Patient states he has not been taking his metformin. I discussed importance of taking medication BID.  He states he has a full prescription at home. He will f/u with pcp.       Catha Gosselin, PA-C 06/11/14 1848  Shon Baton,  MD 06/11/14 [phone]

## 2014-06-11 NOTE — ED Notes (Signed)
Pt admits to not having taken meds; yesterday sugars read "high". Was thirsty so drinking lots of sodas. CGB 528 today.

## 2014-06-11 NOTE — Discharge Instructions (Signed)

## 2014-06-12 ENCOUNTER — Telehealth: Payer: Self-pay | Admitting: *Deleted

## 2014-06-12 NOTE — Telephone Encounter (Signed)
Patient seen in ED on 4/7 for hyperglycemia.  PA left message that patient was needing to be seen by PCP ASAP.  Called Patient who is agreeable to come in next week.  Spoke with Dr. Hyman Hopes who wanted to patient added to his afternoon schedule on Monday.  Patient called and in agreement and was transferred to [facility] to make appointment

## 2014-06-15 ENCOUNTER — Ambulatory Visit: Payer: Self-pay | Attending: Internal Medicine | Admitting: Internal Medicine

## 2014-06-15 ENCOUNTER — Encounter: Payer: Self-pay | Admitting: Internal Medicine

## 2014-06-15 VITALS — BP 108/79 | HR 99 | Temp 97.9°F | Resp 16 | Ht 67.0 in | Wt 182.0 lb

## 2014-06-15 DIAGNOSIS — E1165 Type 2 diabetes mellitus with hyperglycemia: Secondary | ICD-10-CM | POA: Insufficient documentation

## 2014-06-15 DIAGNOSIS — E119 Type 2 diabetes mellitus without complications: Secondary | ICD-10-CM

## 2014-06-15 DIAGNOSIS — Z794 Long term (current) use of insulin: Secondary | ICD-10-CM | POA: Insufficient documentation

## 2014-06-15 LAB — POCT URINALYSIS DIPSTICK
Glucose, UA: 500
Glucose, UA: 500
KETONES UA: 80
Ketones, UA: >=160
LEUKOCYTES UA: NEGATIVE
Leukocytes, UA: NEGATIVE
Nitrite, UA: NEGATIVE
Nitrite, UA: NEGATIVE
PH UA: 5.5
Protein, UA: NEGATIVE
Protein, UA: NEGATIVE
RBC UA: NEGATIVE
SPEC GRAV UA: 1.02
Spec Grav, UA: 1.015
Urobilinogen, UA: 0.2
Urobilinogen, UA: 0.2
pH, UA: 5

## 2014-06-15 LAB — GLUCOSE, POCT (MANUAL RESULT ENTRY)
POC GLUCOSE: 249 mg/dL — AB (ref 70–99)
POC Glucose: 380 mg/dl — AB (ref 70–99)

## 2014-06-15 LAB — POCT GLYCOSYLATED HEMOGLOBIN (HGB A1C): HEMOGLOBIN A1C: 14.9

## 2014-06-15 MED ORDER — GLUCOSE BLOOD VI STRP: ORAL_STRIP | Status: DC

## 2014-06-15 MED ORDER — INSULIN ASPART 100 UNIT/ML ~~LOC~~ SOLN
20.0000 [IU] | Freq: Once | SUBCUTANEOUS | Status: AC
  Administered 2014-06-15: 20 [IU] via SUBCUTANEOUS

## 2014-06-15 MED ORDER — INSULIN GLARGINE 100 UNIT/ML SOLOSTAR PEN: 10.0000 [IU] | PEN_INJECTOR | Freq: Every day | SUBCUTANEOUS | Status: DC

## 2014-06-15 MED ORDER — BLOOD GLUCOSE MONITOR KIT: PACK | Status: DC

## 2014-06-15 MED ORDER — FREESTYLE LANCETS MISC: Status: DC

## 2014-06-15 MED ORDER — INSULIN PEN NEEDLE 29G X 12.7MM MISC: 10.0000 [IU] | Freq: Every day | Status: DC

## 2014-06-15 NOTE — Progress Notes (Signed)
Pt is here following up on his diabetes. Pt reports that his BS have been running high.

## 2014-06-15 NOTE — Patient Instructions (Signed)
Diabetes Mellitus and Food It is important for you to manage your blood sugar (glucose) level. Your blood glucose level can be greatly affected by what you eat. Eating healthier foods in the appropriate amounts throughout the day at about the same time each day will help you control your blood glucose level. It can also help slow or prevent worsening of your diabetes mellitus. Healthy eating may even help you improve the level of your blood pressure and reach or maintain a healthy weight.  HOW CAN FOOD AFFECT ME? Carbohydrates Carbohydrates affect your blood glucose level more than any other type of food. Your dietitian will help you determine how many carbohydrates to eat at each meal and teach you how to count carbohydrates. Counting carbohydrates is important to keep your blood glucose at a healthy level, especially if you are using insulin or taking certain medicines for diabetes mellitus. Alcohol Alcohol can cause sudden decreases in blood glucose (hypoglycemia), especially if you use insulin or take certain medicines for diabetes mellitus. Hypoglycemia can be a life-threatening condition. Symptoms of hypoglycemia (sleepiness, dizziness, and disorientation) are similar to symptoms of having too much alcohol.  If your health care provider has given you approval to drink alcohol, do so in moderation and use the following guidelines:  Women should not have more than one drink per day, and men should not have more than two drinks per day. One drink is equal to:  12 oz of beer.  5 oz of wine.  1 oz of hard liquor.  Do not drink on an empty stomach.  Keep yourself hydrated. Have water, diet soda, or unsweetened iced tea.  Regular soda, juice, and other mixers might contain a lot of carbohydrates and should be counted. WHAT FOODS ARE NOT RECOMMENDED? As you make food choices, it is important to remember that all foods are not the same. Some foods have fewer nutrients per serving than other  foods, even though they might have the same number of calories or carbohydrates. It is difficult to get your body what it needs when you eat foods with fewer nutrients. Examples of foods that you should avoid that are high in calories and carbohydrates but low in nutrients include:  Trans fats (most processed foods list trans fats on the Nutrition Facts label).  Regular soda.  Juice.  Candy.  Sweets, such as cake, pie, doughnuts, and cookies.  Fried foods. WHAT FOODS CAN I EAT? Have nutrient-rich foods, which will nourish your body and keep you healthy. The food you should eat also will depend on several factors, including:  The calories you need.  The medicines you take.  Your weight.  Your blood glucose level.  Your blood pressure level.  Your cholesterol level. You also should eat a variety of foods, including:  Protein, such as meat, poultry, fish, tofu, nuts, and seeds (lean animal proteins are best).  Fruits.  Vegetables.  Dairy products, such as milk, cheese, and yogurt (low fat is best).  Breads, grains, pasta, cereal, rice, and beans.  Fats such as olive oil, trans fat-free margarine, canola oil, avocado, and olives. DOES EVERYONE WITH DIABETES MELLITUS HAVE THE SAME MEAL PLAN? Because every person with diabetes mellitus is different, there is not one meal plan that works for everyone. It is very important that you meet with a dietitian who will help you create a meal plan that is just right for you. Document Released: 11/17/2004 Document Revised: 02/25/2013 Document Reviewed: 01/17/2013 Exit[employer] Patient Information 2015 [employer]. This   information is not intended to replace advice given to you by your health care provider. Make sure you discuss any questions you have with your health care provider. Diabetes and Exercise Exercising regularly is important. It is not just about losing weight. It has many health benefits, such as:  Improving your overall  fitness, flexibility, and endurance.  Increasing your bone density.  Helping with weight control.  Decreasing your body fat.  Increasing your muscle strength.  Reducing stress and tension.  Improving your overall health. People with diabetes who exercise gain additional benefits because exercise:  Reduces appetite.  Improves the body's use of blood sugar (glucose).  Helps lower or control blood glucose.  Decreases blood pressure.  Helps control blood lipids (such as cholesterol and triglycerides).  Improves the body's use of the hormone insulin by:  Increasing the body's insulin sensitivity.  Reducing the body's insulin needs.  Decreases the risk for heart disease because exercising:  Lowers cholesterol and triglycerides levels.  Increases the levels of good cholesterol (such as high-density lipoproteins [HDL]) in the body.  Lowers blood glucose levels. YOUR ACTIVITY PLAN  Choose an activity that you enjoy and set realistic goals. Your health care provider or diabetes educator can help you make an activity plan that works for you. Exercise regularly as directed by your health care provider. This includes:  Performing resistance training twice a week such as push-ups, sit-ups, lifting weights, or using resistance bands.  Performing 150 minutes of cardio exercises each week such as walking, running, or playing sports.  Staying active and spending no more than 90 minutes at one time being inactive. Even short bursts of exercise are good for you. Three 10-minute sessions spread throughout the day are just as beneficial as a single 30-minute session. Some exercise ideas include:  Taking the dog for a walk.  Taking the stairs instead of the elevator.  Dancing to your favorite song.  Doing an exercise video.  Doing your favorite exercise with a friend. RECOMMENDATIONS FOR EXERCISING WITH TYPE 1 OR TYPE 2 DIABETES   Check your blood glucose before exercising. If  blood glucose levels are greater than 240 mg/dL, check for urine ketones. Do not exercise if ketones are present.  Avoid injecting insulin into areas of the body that are going to be exercised. For example, avoid injecting insulin into:  The arms when playing tennis.  The legs when jogging.  Keep a record of:  Food intake before and after you exercise.  Expected peak times of insulin action.  Blood glucose levels before and after you exercise.  The type and amount of exercise you have done.  Review your records with your health care provider. Your health care provider will help you to develop guidelines for adjusting food intake and insulin amounts before and after exercising.  If you take insulin or oral hypoglycemic agents, watch for signs and symptoms of hypoglycemia. They include:  Dizziness.  Shaking.  Sweating.  Chills.  Confusion.  Drink plenty of water while you exercise to prevent dehydration or heat stroke. Body water is lost during exercise and must be replaced.  Talk to your health care provider before starting an exercise program to make sure it is safe for you. Remember, almost any type of activity is better than none. Document Released: 05/13/2003 Document Revised: 07/07/2013 Document Reviewed: 07/30/2012 ExitCare Patient Information 2015 [employer]. This information is not intended to replace advice given to you by your health care provider. Make sure you discuss any   questions you have with your health care provider. Basic Carbohydrate Counting for Diabetes Mellitus Carbohydrate counting is a method for keeping track of the amount of carbohydrates you eat. Eating carbohydrates naturally increases the level of sugar (glucose) in your blood, so it is important for you to know the amount that is okay for you to have in every meal. Carbohydrate counting helps keep the level of glucose in your blood within normal limits. The amount of carbohydrates allowed is  different for every person. A dietitian can help you calculate the amount that is right for you. Once you know the amount of carbohydrates you can have, you can count the carbohydrates in the foods you want to eat. Carbohydrates are found in the following foods:  Grains, such as breads and cereals.  Dried beans and soy products.  Starchy vegetables, such as potatoes, peas, and corn.  Fruit and fruit juices.  Milk and yogurt.  Sweets and snack foods, such as cake, cookies, candy, chips, soft drinks, and fruit drinks. CARBOHYDRATE COUNTING There are two ways to count the carbohydrates in your food. You can use either of the methods or a combination of both. Reading the "Nutrition Facts" on Packaged Food The "Nutrition Facts" is an area that is included on the labels of almost all packaged food and beverages in the [place]. It includes the serving size of that food or beverage and information about the nutrients in each serving of the food, including the grams (g) of carbohydrate per serving.  Decide the number of servings of this food or beverage that you will be able to eat or drink. Multiply that number of servings by the number of grams of carbohydrate that is listed on the label for that serving. The total will be the amount of carbohydrates you will be having when you eat or drink this food or beverage. Learning Standard Serving Sizes of Food When you eat food that is not packaged or does not include "Nutrition Facts" on the label, you need to measure the servings in order to count the amount of carbohydrates.A serving of most carbohydrate-rich foods contains about 15 g of carbohydrates. The following list includes serving sizes of carbohydrate-rich foods that provide 15 g ofcarbohydrate per serving:   1 slice of bread (1 oz) or 1 six-inch tortilla.    of a hamburger bun or English muffin.  4-6 crackers.   cup unsweetened dry cereal.    cup hot cereal.   cup rice or  pasta.    cup mashed potatoes or  of a large baked potato.  1 cup fresh fruit or one small piece of fruit.    cup canned or frozen fruit or fruit juice.  1 cup milk.   cup plain fat-free yogurt or yogurt sweetened with artificial sweeteners.   cup cooked dried beans or starchy vegetable, such as peas, corn, or potatoes.  Decide the number of standard-size servings that you will eat. Multiply that number of servings by 15 (the grams of carbohydrates in that serving). For example, if you eat 2 cups of strawberries, you will have eaten 2 servings and 30 g of carbohydrates (2 servings x 15 g = 30 g). For foods such as soups and casseroles, in which more than one food is mixed in, you will need to count the carbohydrates in each food that is included. EXAMPLE OF CARBOHYDRATE COUNTING Sample Dinner  3 oz chicken breast.   cup of brown rice.   cup of corn.    1 cup milk.   1 cup strawberries with sugar-free whipped topping.  Carbohydrate Calculation Step 1: Identify the foods that contain carbohydrates:   Rice.   Corn.   Milk.   Strawberries. Step 2:Calculate the number of servings eaten of each:   2 servings of rice.   1 serving of corn.   1 serving of milk.   1 serving of strawberries. Step 3: Multiply each of those number of servings by 15 g:   2 servings of rice x 15 g = 30 g.   1 serving of corn x 15 g = 15 g.   1 serving of milk x 15 g = 15 g.   1 serving of strawberries x 15 g = 15 g. Step 4: Add together all of the amounts to find the total grams of carbohydrates eaten: 30 g + 15 g + 15 g + 15 g = 75 g. Document Released: 02/20/2005 Document Revised: 07/07/2013 Document Reviewed: 01/17/2013 ExitCare Patient Information 2015 [employer]. This information is not intended to replace advice given to you by your health care provider. Make sure you discuss any questions you have with your health care provider.  

## 2014-06-15 NOTE — Progress Notes (Signed)
Patient instructed on use of lantus insulin pen with priming. Patient gave correct return demonstration. Patient given literature on Diabetes and Food, Diabetes and Exercise, Basic Carb Counting, hypoglycemia, Living with Type 2 DM and The Plate Method S/sx of hypoglycemia and immediate actions to take discussed in detail Patient will check blood sugar AM fasting and before dinner Patient given blood sugar log and instructed on use. Instructed to bring to all future visits.  After 1000 mL IV NS infused: CBG 249  U/A: Glucose 500 Bilirubin Small Ketone >=160 mg (PCP made aware) Blood Trace-intact  IV d/c, catheter tip intact. Site dry, without redness  Patient to return in 2 weeks for nurse visit for CBG and record review  Patient discharged to home in stable condition.

## 2014-06-15 NOTE — Progress Notes (Signed)
Patient ID: David [name], male   DOB: 1980-11-29, 34 y.o.   MRN: [id number]   David [name], is a 34 y.o. male  IEP:[id number]  [id number]  DOB - 11/02/1980  Chief Complaint  Patient presents with  . Follow-up        Subjective:   David [name] is a 33 y.o. male here today for a follow up visit. Patient has history of diabetes mellitus, uncontrolled. He is here today for follow-up of diabetes. Patient was seen in the ED on 06/11/2014 for hyperglycemia. He had gone to the ED to complain of blurry vision, polyuria, polydipsia and nausea since 2 days prior. Patient was noted to have been drinking alcohol excessively in the past few days before presentation to the ED. He has not been compliant with his medications, he complains of having diarrhea with metformin. He denies history of DKA. He is here today for ED visit follow-up and for blood sugar control. Patient has No headache, No chest pain, No abdominal pain - No Nausea, No new weakness tingling or numbness, No Cough - SOB.  No problems updated.  ALLERGIES: No Known Allergies  PAST MEDICAL HISTORY: Past Medical History  Diagnosis Date  . Diabetes mellitus without complication     MEDICATIONS AT HOME: Prior to Admission medications   Medication Sig Start Date End Date Taking? Authorizing Provider  glucose blood (ACCU-CHEK AVIVA) test strip Use as instructed 06/15/14  Yes Tresa Garter, MD  Lancets (FREESTYLE) lancets Use as instructed 06/15/14  Yes Tresa Garter, MD  metFORMIN (GLUCOPHAGE) 1000 MG tablet Take 1 tablet (1,000 mg total) by mouth 2 (two) times daily with a meal. 12/02/13  Yes Tresa Garter, MD  acetaminophen (TYLENOL) 500 MG tablet Take 500 mg by mouth every 6 (six) hours as needed for moderate pain.    Historical Provider, MD  Aspirin-Acetaminophen (GOODYS BODY PAIN PO) Take 1 packet by mouth daily as needed (for pain).     Historical Provider, MD  blood glucose meter kit and supplies KIT  Dispense based on patient and insurance preference. Use up to four times daily as directed. (FOR ICD-9 250.00, 250.01). 06/15/14   Tresa Garter, MD  Dapagliflozin Propanediol (FARXIGA) 5 MG TABS Take 5 mg by mouth every morning. Patient not taking: Reported on 06/11/2014 12/23/13   Tresa Garter, MD  Insulin Glargine (LANTUS SOLOSTAR) 100 UNIT/ML Solostar Pen Inject 10 Units into the skin daily at 10 pm. 06/15/14   Tresa Garter, MD     Objective:   Filed Vitals:   06/15/14 1654  BP: 108/79  Pulse: 99  Temp: 97.9 F (36.6 C)  TempSrc: Oral  Resp: 16  Height: _0  (1.702 m)  Weight: 182 lb (82.555 kg)  SpO2: 97%    Exam General appearance : Awake, alert, not in any distress. Speech Clear. Not toxic looking HEENT: Atraumatic and Normocephalic, pupils equally reactive to light and accomodation Neck: supple, no JVD. No cervical lymphadenopathy.  Chest:Good air entry bilaterally, no added sounds  CVS: S1 S2 regular, no murmurs.  Abdomen: Bowel sounds present, Non tender and not distended with no gaurding, rigidity or rebound. Extremities: B/L Lower Ext shows no edema, both legs are warm to touch Neurology: Awake alert, and oriented X 3, CN II-XII intact, Non focal Skin:No Rash  Data Review Lab Results  Component Value Date   HGBA1C 14.90 06/15/2014   HGBA1C 10.6 12/02/2013     Assessment & Plan   1. Type 2  diabetes mellitus without complication  - Glucose (CBG) - HgB A1c is 14.9% today  - insulin aspart (novoLOG) injection 20 Units; Inject 0.2 mLs (20 Units total) into the skin once given  - Urinalysis Dipstick - COMPLETE METABOLIC PANEL WITH GFR Start - Insulin Glargine (LANTUS SOLOSTAR) 100 UNIT/ML Solostar Pen; Inject 10 Units into the skin daily at 10 pm.  Dispense: 15 mL; Refill: 3 - Lancets (FREESTYLE) lancets; Use as instructed  Dispense: 100 each; Refill: 12 - glucose blood (ACCU-CHEK AVIVA) test strip; Use as instructed  Dispense: 100 each;  Refill: 12 - blood glucose meter kit and supplies KIT; Dispense based on patient and insurance preference. Use up to four times daily as directed. (FOR ICD-9 250.00, 250.01).  Dispense: 1 each; Refill: 0  Aim for 30 minutes of exercise most days. Rethink what you drink. Water is great! Aim for 2-3 Carb Choices per meal (30-45 grams) +/- 1 either way  Aim for 0-15 Carbs per snack if hungry  Include protein in moderation with your meals and snacks  Consider reading food labels for Total Carbohydrate and Fat Grams of foods  Consider checking BG at alternate times per day  Continue taking medication as directed Be mindful about how much sugar you are adding to beverages and other foods. Fruit Punch - find one with no sugar  Measure and decrease portions of carbohydrate foods  Make your plate and don't go back for seconds  Patient was given 1 L of normal saline IV bolus for hyperglycemia, blood sugar came down to 249 after IV fluid and 20 units of insulin [place]. Patient has been advised to check blood sugar at least 2 times per day and bring log to the clinic in 2 weeks but return to clinic tomorrow for blood sugar check and further education on diabetic control.  Patient have been counseled extensively about nutrition and exercise  Return in about 1 day (around 06/16/2014) for CBG, Lab/Nurse Visit.  The patient was given clear instructions to go to ER or return to medical center if symptoms don't improve, worsen or new problems develop. The patient verbalized understanding. The patient was told to call to get lab results if they haven't heard anything in the next week.   This note has been created with Surveyor, quantity. Any transcriptional errors are unintentional.    David Chessman, MD, [place], [place], [place], [place] [facility] [place], [place]   06/15/2014, 5:55 PM

## 2014-06-16 LAB — COMPLETE METABOLIC PANEL WITH GFR
ALT: 40 U/L (ref 0–53)
AST: 40 U/L — ABNORMAL HIGH (ref 0–37)
Albumin: 3.6 g/dL (ref 3.5–5.2)
Alkaline Phosphatase: 53 U/L (ref 39–117)
BILIRUBIN TOTAL: 0.5 mg/dL (ref 0.2–1.2)
BUN: 15 mg/dL (ref 6–23)
CHLORIDE: 99 meq/L (ref 96–112)
CO2: 19 mEq/L (ref 19–32)
CREATININE: 0.96 mg/dL (ref 0.50–1.35)
Calcium: 8.8 mg/dL (ref 8.4–10.5)
GFR, Est Non African American: >89 mL/min
Glucose, Bld: 296 mg/dL — ABNORMAL HIGH (ref 70–99)
POTASSIUM: 3.6 meq/L (ref 3.5–5.3)
Sodium: 132 mEq/L — ABNORMAL LOW (ref 135–145)
Total Protein: 6.8 g/dL (ref 6.0–8.3)

## 2014-06-29 ENCOUNTER — Telehealth: Payer: Self-pay

## 2014-06-29 NOTE — Telephone Encounter (Signed)
Patient is aware of his lab results 

## 2014-06-29 NOTE — Telephone Encounter (Signed)
-----   Message from Quentin Angst, MD sent at 06/26/2014  3:42 PM EDT ----- Please inform patient that his laboratory results shows evidence of poorly controlled diabetes. Will encourage patient to continue the new regimen prescribed during his last visit, engage in regular physical exercise and low carbohydrate diet.   Aim for 2-3 Carb Choices per meal (30-45 grams) +/- 1 either way  Aim for 0-15 Carbs per snack if hungry  Include protein in moderation with your meals and snacks  Consider reading food labels for Total Carbohydrate and Fat Grams of foods  Consider checking BG at alternate times per day  Continue taking medication as directed Fruit Punch - find one with no sugar  Measure and decrease portions of carbohydrate foods  Make your plate and don't go back for seconds

## 2014-06-30 ENCOUNTER — Ambulatory Visit: Payer: Self-pay

## 2014-07-07 ENCOUNTER — Ambulatory Visit: Payer: Self-pay

## 2014-07-14 ENCOUNTER — Ambulatory Visit: Payer: Self-pay

## 2014-07-31 ENCOUNTER — Encounter (HOSPITAL_COMMUNITY): Payer: Self-pay | Admitting: Emergency Medicine

## 2014-07-31 ENCOUNTER — Emergency Department (HOSPITAL_COMMUNITY)
Admission: EM | Admit: 2014-07-31 | Discharge: 2014-07-31 | Disposition: A | Discharge status: Home / Self Care | Payer: Self-pay | Attending: Emergency Medicine | Admitting: Emergency Medicine

## 2014-07-31 DIAGNOSIS — Z79899 Other long term (current) drug therapy: Secondary | ICD-10-CM | POA: Insufficient documentation

## 2014-07-31 DIAGNOSIS — Z87891 Personal history of nicotine dependence: Secondary | ICD-10-CM | POA: Insufficient documentation

## 2014-07-31 DIAGNOSIS — R110 Nausea: Secondary | ICD-10-CM | POA: Insufficient documentation

## 2014-07-31 DIAGNOSIS — E119 Type 2 diabetes mellitus without complications: Secondary | ICD-10-CM | POA: Insufficient documentation

## 2014-07-31 DIAGNOSIS — L03115 Cellulitis of right lower limb: Secondary | ICD-10-CM | POA: Insufficient documentation

## 2014-07-31 DIAGNOSIS — R42 Dizziness and giddiness: Secondary | ICD-10-CM | POA: Insufficient documentation

## 2014-07-31 MED ORDER — OXYCODONE-ACETAMINOPHEN 5-325 MG PO TABS
1.0000 | ORAL_TABLET | Freq: Once | ORAL | Status: AC
  Administered 2014-07-31: 1 via ORAL
  Filled 2014-07-31: qty 1

## 2014-07-31 MED ORDER — SULFAMETHOXAZOLE-TRIMETHOPRIM 800-160 MG PO TABS: 1.0000 | ORAL_TABLET | Freq: Two times a day (BID) | ORAL | Status: DC

## 2014-07-31 MED ORDER — NAPROXEN 500 MG PO TABS: 500.0000 mg | ORAL_TABLET | Freq: Two times a day (BID) | ORAL | Status: DC

## 2014-07-31 NOTE — ED Notes (Signed)
Pt. reports progressing skin abscess at right lower leg onset last week with pain and drainage , pt. added right lower molar pain with swelling seen by his dentist this week prescribed with oral antibiotic. Denies fever or chills.

## 2014-07-31 NOTE — ED Provider Notes (Signed)
CSN: [id number]     Arrival date & time 07/31/14  1954 History  This chart was scribed for non-physician practitioner Etta Quill, NP working with Quintella Reichert, MD by Meriel Pica, ED Scribe. This patient was seen in room TR02C/TR02C and the patient's care was started at 9:05 PM.  Chief Complaint  Patient presents with  . Abscess  . Dental Pain   Patient is a 34 y.o. male presenting with abscess and tooth pain. The history is provided by the patient and a friend. No language interpreter was used.  Abscess Location:  Leg Leg abscess location:  R lower leg Abscess quality: painful and redness   Progression:  Worsening Pain details:    Severity:  Moderate   Progression:  Worsening Chronicity:  New Relieved by:  Nothing Ineffective treatments:  None tried Associated symptoms: nausea   Associated symptoms: no fever and no vomiting   Dental Pain Associated symptoms: no fever     HPI Comments: David [name] is a 34 y.o. male, with PMHx of DM, who presents to the Emergency Department complaining of a gradually worsening area of pain and redness on his right shin onset 6 days ago. He reports associated symptoms of dizziness, swelling, nausea, and subjective fever. Pt was seen by dentist last week for dental pain and swelling. He was prescribed Penicillin. He is taking Motrin 814m for dental pain with no relief per friend. He denies fever, chills, or vomiting. NKDA  [facility]  Past Medical History  Diagnosis Date  . Diabetes mellitus without complication    History reviewed. No pertinent past surgical history. Family History  Problem Relation Age of Onset  . Diabetes Father    History  Substance Use Topics  . Smoking status: Former Smoker -- 0.50 packs/day    Quit date: 10/27/2013  . Smokeless tobacco: Never Used  . Alcohol Use: 17.5 oz/week    35 drink(s) per week     Comment: has quit since diagnosed with Diabetes    Review of Systems   Constitutional: Negative for fever and chills.  Gastrointestinal: Positive for nausea. Negative for vomiting.  Skin: Positive for wound.  Neurological: Positive for dizziness.  All other systems reviewed and are negative.   Allergies  Review of patient's allergies indicates no known allergies.  Home Medications   Prior to Admission medications   Medication Sig Start Date End Date Taking? Authorizing Provider  acetaminophen (TYLENOL) 500 MG tablet Take 500 mg by mouth every 6 (six) hours as needed for moderate pain.    Historical Provider, MD  Aspirin-Acetaminophen (GOODYS BODY PAIN PO) Take 1 packet by mouth daily as needed (for pain).     Historical Provider, MD  blood glucose meter kit and supplies KIT Dispense based on patient and insurance preference. Use up to four times daily as directed. (FOR ICD-9 250.00, 250.01). 06/15/14   OTresa Garter MD  Dapagliflozin Propanediol (FARXIGA) 5 MG TABS Take 5 mg by mouth every morning. Patient not taking: Reported on 06/11/2014 12/23/13   OTresa Garter MD  glucose blood (ACCU-CHEK AVIVA) test strip Use as instructed 06/15/14   OTresa Garter MD  Insulin Glargine (LANTUS SOLOSTAR) 100 UNIT/ML Solostar Pen Inject 10 Units into the skin daily at 10 pm. 06/15/14   OTresa Garter MD  Insulin Pen Needle 29G X 12.7MM MISC 10 Units by Does not apply route at bedtime. 06/15/14   OTresa Garter MD  Lancets (FREESTYLE) lancets  Use as instructed 06/15/14   Tresa Garter, MD  metFORMIN (GLUCOPHAGE) 1000 MG tablet Take 1 tablet (1,000 mg total) by mouth 2 (two) times daily with a meal. 12/02/13   Olugbemiga E Jegede, MD   BP 124/82 mmHg  Pulse 111  Temp(Src) 98.9 F (37.2 C) (Oral)  Resp 20  SpO2 97% Physical Exam  Constitutional: He is oriented to person, place, and time. He appears well-developed and well-nourished. No distress.  HENT:  Head: Normocephalic and atraumatic.  Eyes: Conjunctivae and EOM are normal.  Neck:  Neck supple.  Cardiovascular: Normal rate.   Pulmonary/Chest: Breath sounds normal. No respiratory distress.  Neurological: He is alert and oriented to person, place, and time.  Skin: Skin is warm. There is erythema.  4x3cm area of erythema and induration of right shin   Psychiatric: His behavior is normal.  Nursing note and vitals reviewed.   ED Course  Procedures  DIAGNOSTIC STUDIES: Oxygen Saturation is 97% on RA, normal by my interpretation.    COORDINATION OF CARE: 9:09 PM Discussed treatment plan which includes ordering an antibiotic with pt. Pt acknowledges and agrees to plan.   Labs Review Labs Reviewed - No data to display  Imaging Review No results found.   EKG Interpretation None         MDM   Final diagnoses:  None    Cellulitis of right lower leg. Patient is already taking amoxicillin for dental infection. Will add bactrim to treatment regimen. Wound care provided. Follow-up with PCP. Return precautions discussed.  I personally performed the services described in this documentation, which was scribed in my presence. The recorded information has been reviewed and is accurate.    Etta Quill, NP 08/01/14 [id number]  Quintella Reichert, MD 08/10/14 [phone]

## 2014-07-31 NOTE — Discharge Instructions (Signed)

## 2014-09-23 ENCOUNTER — Emergency Department (HOSPITAL_COMMUNITY)
Admission: EM | Admit: 2014-09-23 | Discharge: 2014-09-23 | Disposition: A | Discharge status: Home / Self Care | Payer: Self-pay | Attending: Emergency Medicine | Admitting: Emergency Medicine

## 2014-09-23 ENCOUNTER — Encounter (HOSPITAL_COMMUNITY): Payer: Self-pay | Admitting: Emergency Medicine

## 2014-09-23 DIAGNOSIS — Z87891 Personal history of nicotine dependence: Secondary | ICD-10-CM | POA: Insufficient documentation

## 2014-09-23 DIAGNOSIS — R109 Unspecified abdominal pain: Secondary | ICD-10-CM | POA: Insufficient documentation

## 2014-09-23 DIAGNOSIS — H538 Other visual disturbances: Secondary | ICD-10-CM | POA: Insufficient documentation

## 2014-09-23 DIAGNOSIS — Z794 Long term (current) use of insulin: Secondary | ICD-10-CM | POA: Insufficient documentation

## 2014-09-23 DIAGNOSIS — E1165 Type 2 diabetes mellitus with hyperglycemia: Secondary | ICD-10-CM | POA: Insufficient documentation

## 2014-09-23 DIAGNOSIS — R739 Hyperglycemia, unspecified: Secondary | ICD-10-CM

## 2014-09-23 DIAGNOSIS — Z79899 Other long term (current) drug therapy: Secondary | ICD-10-CM | POA: Insufficient documentation

## 2014-09-23 LAB — URINALYSIS, ROUTINE W REFLEX MICROSCOPIC
Bilirubin Urine: NEGATIVE
Hgb urine dipstick: NEGATIVE
Ketones, ur: >80 mg/dL — AB
Leukocytes, UA: NEGATIVE
NITRITE: NEGATIVE
PH: 5.5 (ref 5.0–8.0)
Protein, ur: NEGATIVE mg/dL
SPECIFIC GRAVITY, URINE: 1.038 — AB (ref 1.005–1.030)
UROBILINOGEN UA: 0.2 mg/dL (ref 0.0–1.0)

## 2014-09-23 LAB — BASIC METABOLIC PANEL
Anion gap: 16 — ABNORMAL HIGH (ref 5–15)
BUN: 14 mg/dL (ref 6–20)
CALCIUM: 9.1 mg/dL (ref 8.9–10.3)
CHLORIDE: 96 mmol/L — AB (ref 101–111)
CO2: 20 mmol/L — ABNORMAL LOW (ref 22–32)
Creatinine, Ser: 1.2 mg/dL (ref 0.61–1.24)
GFR calc Af Amer: >60 mL/min (ref >60)
GFR calc non Af Amer: >60 mL/min (ref >60)
Glucose, Bld: 498 mg/dL — ABNORMAL HIGH (ref 65–99)
Potassium: 4.4 mmol/L (ref 3.5–5.1)
Sodium: 132 mmol/L — ABNORMAL LOW (ref 135–145)

## 2014-09-23 LAB — CBG MONITORING, ED
Glucose-Capillary: 530 mg/dL — ABNORMAL HIGH (ref 65–99)
Glucose-Capillary: 374 mg/dL — ABNORMAL HIGH (ref 65–99)

## 2014-09-23 LAB — URINE MICROSCOPIC-ADD ON: WBC UA: NONE SEEN WBC/hpf (ref <3)

## 2014-09-23 MED ORDER — SODIUM CHLORIDE 0.9 % IV BOLUS (SEPSIS)
1000.0000 mL | Freq: Once | INTRAVENOUS | Status: AC
Start: 2014-09-23 — End: 2014-09-23
  Administered 2014-09-23: 1000 mL via INTRAVENOUS

## 2014-09-23 NOTE — Progress Notes (Signed)
[facility] Eligibility Specialist [facility] [phone]  Spoke to patient regarding primary care resources and the [facility] orange card. Follow up appointment made with the [facility] for Monday Oct 05, 2014 @ 10:00am. Financial counseling appointment also made for [facility] Aug 17,2016 @ 2:30pm. Patient verbalized understanding of appointments. Orange card application provided and explained. My contact information also given for any future questions or concerns. No other [facility] Specialist needs identified at this time.

## 2014-09-23 NOTE — ED Provider Notes (Signed)
CSN: [id number]     Arrival date & time 09/23/14  0911 History   First MD Initiated Contact with Patient 09/23/14 [phone]     Chief Complaint  Patient presents with  . Hyperglycemia     (Consider location/radiation/quality/duration/timing/severity/associated sxs/prior Treatment) HPI Comments: Patient with history of diabetes presents with complaint of elevated blood sugar, feeling poorly, as well as sharp pain over his bladder which is intermittent and nonradiating. Patient states that he has not been taking his insulin as he should. He is also on oral medications for diabetes but states that it hurts his stomach so he does not take these. He has had polydipsia and polyuria. No abdominal pain, nausea or vomiting. He denies fever or other symptoms of illness including URI symptoms, cough, diarrhea. No treatments prior to arrival. The onset of this condition was chronic. The course is constant. Aggravating factors: none. Alleviating factors: none.     Patient is a 34 y.o. male presenting with hyperglycemia. The history is provided by the patient and medical records.  Hyperglycemia Associated symptoms: abdominal pain (occasional suprapubic pain), increased thirst and polyuria   Associated symptoms: no chest pain, no dysuria, no fever, no nausea and no vomiting     Past Medical History  Diagnosis Date  . Diabetes mellitus without complication    History reviewed. No pertinent past surgical history. Family History  Problem Relation Age of Onset  . Diabetes Father    History  Substance Use Topics  . Smoking status: Former Smoker -- 0.50 packs/day    Quit date: 10/27/2013  . Smokeless tobacco: Never Used  . Alcohol Use: 17.5 oz/week    35 drink(s) per week     Comment: has quit since diagnosed with Diabetes    Review of Systems  Constitutional: Negative for fever.       + malaise  HENT: Negative for rhinorrhea and sore throat.   Eyes: Positive for visual disturbance (occasional  blurry vision when sugar is high). Negative for redness.  Respiratory: Negative for cough.   Cardiovascular: Negative for chest pain.  Gastrointestinal: Positive for abdominal pain (occasional suprapubic pain). Negative for nausea, vomiting and diarrhea.  Endocrine: Positive for polydipsia and polyuria.  Genitourinary: Positive for frequency. Negative for dysuria and hematuria.  Musculoskeletal: Negative for myalgias.  Skin: Negative for rash.  Neurological: Negative for headaches.    Allergies  Review of patient's allergies indicates no known allergies.  Home Medications   Prior to Admission medications   Medication Sig Start Date End Date Taking? Authorizing Provider  acetaminophen (TYLENOL) 500 MG tablet Take 500 mg by mouth every 6 (six) hours as needed for moderate pain.   Yes Historical Provider, MD  Aspirin-Acetaminophen (GOODYS BODY PAIN PO) Take 1 packet by mouth daily as needed (for pain).    Yes Historical Provider, MD  blood glucose meter kit and supplies KIT Dispense based on patient and insurance preference. Use up to four times daily as directed. (FOR ICD-9 250.00, 250.01). 06/15/14  Yes Tresa Garter, MD  Dapagliflozin Propanediol (FARXIGA) 5 MG TABS Take 5 mg by mouth every morning. 12/23/13  Yes Tresa Garter, MD  glucose blood (ACCU-CHEK AVIVA) test strip Use as instructed 06/15/14  Yes Olugbemiga E Doreene Burke, MD  Insulin Glargine (LANTUS SOLOSTAR) 100 UNIT/ML Solostar Pen Inject 10 Units into the skin daily at 10 pm. 06/15/14  Yes Olugbemiga E Doreene Burke, MD  Insulin Pen Needle 29G X 12.7MM MISC 10 Units by Does not apply route at bedtime. 06/15/14  Yes Tresa Garter, MD  Lancets (FREESTYLE) lancets Use as instructed 06/15/14  Yes Tresa Garter, MD  metFORMIN (GLUCOPHAGE) 1000 MG tablet Take 1 tablet (1,000 mg total) by mouth 2 (two) times daily with a meal. 12/02/13  Yes Olugbemiga E Doreene Burke, MD  naproxen (NAPROSYN) 500 MG tablet Take 1 tablet (500 mg total)  by mouth 2 (two) times daily. 07/31/14  Yes Etta Quill, NP  sulfamethoxazole-trimethoprim (BACTRIM DS,SEPTRA DS) 800-160 MG per tablet Take 1 tablet by mouth 2 (two) times daily. Patient not taking: Reported on 09/23/2014 07/31/14   Etta Quill, NP   BP 101/67 mmHg  Pulse 92  Temp(Src) 97.7 F (36.5 C) (Oral)  Resp 18  Ht _0  (1.702 m)  Wt   SpO2 98%   Physical Exam  Constitutional: He is oriented to person, place, and time. He appears well-developed and well-nourished.  HENT:  Head: Normocephalic and atraumatic.  Right Ear: Tympanic membrane, external ear and ear canal normal.  Left Ear: Tympanic membrane, external ear and ear canal normal.  Nose: Nose normal.  Mouth/Throat: Uvula is midline and oropharynx is clear and moist. Mucous membranes are dry.  Eyes: Conjunctivae, EOM and lids are normal. Pupils are equal, round, and reactive to light. Right eye exhibits no discharge. Left eye exhibits no discharge.  Neck: Normal range of motion. Neck supple.  Cardiovascular: Normal rate, regular rhythm and normal heart sounds.   No murmur heard. Pulmonary/Chest: Effort normal and breath sounds normal. No respiratory distress. He has no wheezes. He has no rales.  Abdominal: Soft. Bowel sounds are normal. There is no tenderness. There is no rebound and no guarding.  Musculoskeletal: Normal range of motion. He exhibits no edema.       Cervical back: He exhibits normal range of motion, no tenderness and no bony tenderness.  Neurological: He is alert and oriented to person, place, and time. He has normal strength and normal reflexes. No cranial nerve deficit or sensory deficit. He exhibits normal muscle tone. He displays a negative Romberg sign. Coordination and gait normal. GCS eye subscore is 4. GCS verbal subscore is 5. GCS motor subscore is 6.  Skin: Skin is warm and dry.  Psychiatric: He has a normal mood and affect.  Nursing note and vitals reviewed.   ED Course  Procedures (including  critical care time) Labs Review Labs Reviewed  BASIC METABOLIC PANEL - Abnormal; Notable for the following:    Sodium 132 (*)    Chloride 96 (*)    CO2 20 (*)    Glucose, Bld 498 (*)    Anion gap 16 (*)    All other components within normal limits  URINALYSIS, ROUTINE W REFLEX MICROSCOPIC (NOT AT [facility]) - Abnormal; Notable for the following:    Specific Gravity, Urine 1.038 (*)    Glucose, UA >1000 (*)    Ketones, ur >80 (*)    All other components within normal limits  CBG MONITORING, ED - Abnormal; Notable for the following:    Glucose-Capillary 530 (*)    All other components within normal limits  CBG MONITORING, ED - Abnormal; Notable for the following:    Glucose-Capillary 374 (*)    All other components within normal limits  URINE MICROSCOPIC-ADD ON  CBG MONITORING, ED    Imaging Review No results found.   EKG Interpretation None       10:36 AM Patient seen and examined. Work-up initiated. Medications ordered.   Vital signs reviewed and are as follows:  BP 101/67 mmHg  Pulse 92  Temp(Src) 97.7 F (36.5 C) (Oral)  Resp 18  Ht _0  (1.702 m)  Wt   SpO2 98%  2:50 PM Patient received fluids with improvement of blood glucose into the 300 range.  Reviewed lab work with Dr. Jeneen Rinks. Do not feel that patient's current condition represents significant DKA. He does have ketones in urine. Slightly low bicarbonate of 20. Patient does not have any abdominal pain, nausea or vomiting. He is not tachypneic and is not tachycardic. He is tolerating oral fluids in room.   Patient has been seen by SW. Arrangements made for f/u at [place]. [facility] and pick up insulin.  Patient urged to return with worsening symptoms or other concerns. Patient verbalized understanding and agrees with plan.    MDM   Final diagnoses:  Hyperglycemia   Hyperglycemia without ketosis 2/2 non-compliance. Treated with fluids.  Patient does not have clinical DKA. His abd is soft and non-tender. Arrangements made for meds and follow-up.    Carlisle Cater, PA-C 09/23/14 [place], MD 09/24/14 1007

## 2014-09-23 NOTE — Discharge Instructions (Signed)
Please read and follow all provided instructions.  Your diagnoses today include:  1. Hyperglycemia    Tests performed today include:  Blood counts and electrolytes - shows high blood sugar  Vital signs. See below for your results today.   Medications prescribed:   None  Take any prescribed medications only as directed.  Home care instructions:  Follow any educational materials contained in this packet.  Go pick up your insulin at [facility].   Follow-up as planned.   BE VERY CAREFUL not to take multiple medicines containing Tylenol (also called acetaminophen). Doing so can lead to an overdose which can damage your liver and cause liver failure and possibly death.   Follow-up instructions: Please follow-up with your primary care provider in the next 3 days for further evaluation of your symptoms.   Return instructions:   Please return to the Emergency Department if you experience worsening symptoms.   Return with abdominal pain, nausea, or vomiting.   Please return if you have any other emergent concerns.  Additional Information:  Your vital signs today were: BP 114/90 mmHg   Pulse 70   Temp(Src) 97.7 F (36.5 C) (Oral)   Resp 16   Ht 5\' 7"  (1.702 m)   Wt    SpO2 100% If your blood pressure (BP) was elevated above 135/85 this visit, please have this repeated by your doctor within one month. --------------

## 2014-09-23 NOTE — ED Notes (Signed)
Pt sts hyperglycemia; pt sts not taking meds or eating correctly; pt sts PU/PD

## 2014-09-23 NOTE — ED Notes (Addendum)
Patient CBG was 530, Nurse was informed. Patient aware we need a ua.

## 2014-09-23 NOTE — ED Notes (Signed)
Patient cbg was 62, Nurse was informed.

## 2014-10-05 ENCOUNTER — Ambulatory Visit: Payer: Self-pay | Admitting: Internal Medicine

## 2014-10-21 ENCOUNTER — Ambulatory Visit: Payer: Self-pay

## 2015-07-11 DIAGNOSIS — S62309A Unspecified fracture of unspecified metacarpal bone, initial encounter for closed fracture: Secondary | ICD-10-CM

## 2015-07-11 HISTORY — DX: Unspecified fracture of unspecified metacarpal bone, initial encounter for closed fracture: S62.309A

## 2015-07-14 ENCOUNTER — Emergency Department (HOSPITAL_COMMUNITY): Payer: Self-pay

## 2015-07-14 ENCOUNTER — Encounter (HOSPITAL_COMMUNITY): Payer: Self-pay | Admitting: Nurse Practitioner

## 2015-07-14 ENCOUNTER — Emergency Department (HOSPITAL_COMMUNITY)
Admission: EM | Admit: 2015-07-14 | Discharge: 2015-07-14 | Disposition: A | Discharge status: Home / Self Care | Payer: Self-pay | Attending: Emergency Medicine | Admitting: Emergency Medicine

## 2015-07-14 DIAGNOSIS — S62338A Displaced fracture of neck of other metacarpal bone, initial encounter for closed fracture: Secondary | ICD-10-CM

## 2015-07-14 DIAGNOSIS — Z791 Long term (current) use of non-steroidal anti-inflammatories (NSAID): Secondary | ICD-10-CM | POA: Insufficient documentation

## 2015-07-14 DIAGNOSIS — E119 Type 2 diabetes mellitus without complications: Secondary | ICD-10-CM | POA: Insufficient documentation

## 2015-07-14 DIAGNOSIS — Z7984 Long term (current) use of oral hypoglycemic drugs: Secondary | ICD-10-CM | POA: Insufficient documentation

## 2015-07-14 DIAGNOSIS — Z794 Long term (current) use of insulin: Secondary | ICD-10-CM | POA: Insufficient documentation

## 2015-07-14 DIAGNOSIS — Z87891 Personal history of nicotine dependence: Secondary | ICD-10-CM | POA: Insufficient documentation

## 2015-07-14 DIAGNOSIS — Y9289 Other specified places as the place of occurrence of the external cause: Secondary | ICD-10-CM | POA: Insufficient documentation

## 2015-07-14 DIAGNOSIS — Y998 Other external cause status: Secondary | ICD-10-CM | POA: Insufficient documentation

## 2015-07-14 DIAGNOSIS — S62336A Displaced fracture of neck of fifth metacarpal bone, right hand, initial encounter for closed fracture: Secondary | ICD-10-CM | POA: Insufficient documentation

## 2015-07-14 DIAGNOSIS — Y9389 Activity, other specified: Secondary | ICD-10-CM | POA: Insufficient documentation

## 2015-07-14 MED ORDER — HYDROCODONE-ACETAMINOPHEN 5-325 MG PO TABS: 1.0000 | ORAL_TABLET | Freq: Four times a day (QID) | ORAL | Status: DC | PRN

## 2015-07-14 MED ORDER — IBUPROFEN 600 MG PO TABS: 600.0000 mg | ORAL_TABLET | Freq: Four times a day (QID) | ORAL | Status: DC | PRN

## 2015-07-14 MED ORDER — IBUPROFEN 400 MG PO TABS
600.0000 mg | ORAL_TABLET | Freq: Once | ORAL | Status: AC
  Administered 2015-07-14: 600 mg via ORAL
  Filled 2015-07-14: qty 1

## 2015-07-14 NOTE — ED Notes (Signed)
Ortho tech paged  
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
## 2015-07-14 NOTE — Discharge Instructions (Signed)
Please call Dr. Glenna Durand office to schedule a follow up appointment. In the meantime take medications as prescribed as needed for pain. Keep your arm elevated when resting at home. Return to the ER for new or worsening symptoms.   Metacarpal Fracture A metacarpal fracture is a break (fracture) of a bone in the hand. Metacarpals are the bones that extend from your knuckles to your wrist. In each hand, you have five metacarpal bones that connect your fingers and your thumb to your wrist. Some hand fractures have bone pieces that are close together and stable (simple). These fractures may be treated with only a splint or cast. Hand fractures that have many pieces of broken bone (comminuted), unstable bone pieces (displaced), or a bone that breaks through the skin (compound) usually require surgery. CAUSES This injury may be caused by:  A fall.  A hard, direct hit to your hand.  An injury that squeezes your knuckle, stretches your finger out of place, or crushes your hand. RISK FACTORS This injury is more likely to occur if:  You play contact sports.  You have certain bone diseases. SYMPTOMS  Symptoms of this type of fracture develop soon after the injury. Symptoms may include:  Swelling.  Pain.  Stiffness.  Increased pain with movement.  Bruising.  Inability to move a finger.  A shortened finger.  A finger knuckle that looks sunken in.  Unusual appearance of the hand or finger (deformity). DIAGNOSIS  This injury may be diagnosed based on your signs and symptoms, especially if you had a recent hand injury. Your health care provider will perform a physical exam. He or she may also order X-rays to confirm the diagnosis.  TREATMENT  Treatment for this injury depends on the type of fracture you have and how severe it is. Possible treatments include:  Non-reduction. This can be done if the bone does not need to be moved back into place. The fracture can be casted or splinted as  it is.   Closed reduction. If your bone is stable and can be moved back into place, you may only need to wear a cast or splint or have buddy taping.  Closed reduction with internal fixation (CRIF). This is the most common treatment. You may have this procedure if your bone can be moved back into place but needs more support. Wires, pins, or screws may be inserted through your skin to stabilize the fracture.  Open reduction with internal fixation (ORIF). This may be needed if your fracture is severe and unstable. It involves surgery to move your bone back into the right position. Screws, wires, or plates are used to stabilize the fracture. After all procedures, you may need to wear a cast or a splint for several weeks. You will also need to have follow-up X-rays to make sure that the bone is healing well and staying in position. After you no longer need your cast or splint, you may need physical therapy. This will help you to regain full movement and strength in your hand.  HOME CARE INSTRUCTIONS  If You Have a Cast:  Do not stick anything inside the cast to scratch your skin. Doing that increases your risk of infection.  Check the skin around the cast every day. Report any concerns to your health care provider. You may put lotion on dry skin around the edges of the cast. Do not apply lotion to the skin underneath the cast. If You Have a Splint:  Wear it as directed by  your health care provider. Remove it only as directed by your health care provider.  Loosen the splint if your fingers become numb and tingle, or if they turn cold and blue. Bathing  Cover the cast or splint with a watertight plastic bag to protect it from water while you take a bath or a shower. Do not let the cast or splint get wet. Managing Pain, Stiffness, and Swelling  If directed, apply ice to the injured area (if you have a splint, not a cast):  Put ice in a plastic bag.  Place a towel between your skin and the  bag.  Leave the ice on for 20 minutes, 2-3 times a day.  Move your fingers often to avoid stiffness and to lessen swelling.  Raise the injured area above the level of your heart while you are sitting or lying down. Driving  Do not drive or operate heavy machinery while taking pain medicine.  Do not drive while wearing a cast or splint on a hand that you use for driving. Activity  Return to your normal activities as directed by your health care provider. Ask your health care provider what activities are safe for you. General Instructions  Do not put pressure on any part of the cast or splint until it is fully hardened. This may take several hours.  Keep the cast or splint clean and dry.  Do not use any tobacco products, including cigarettes, chewing tobacco, or electronic cigarettes. Tobacco can delay bone healing. If you need help quitting, ask your health care provider.  Take medicines only as directed by your health care provider.  Keep all follow-up visits as directed by your health care provider. This is important. SEEK MEDICAL CARE IF:   Your pain is getting worse.  You have redness, swelling, or pain in the injured area.   You have fluid, blood, or pus coming from under your cast or splint.   You notice a bad smell coming from under your cast or splint.   You have a fever.  SEEK IMMEDIATE MEDICAL CARE IF:   You develop a rash.   You have trouble breathing.   Your skin or nails on your injured hand turn blue or gray even after you loosen your splint.  Your injured hand feels cold or becomes numb even after you loosen your splint.   You develop severe pain under the cast or in your hand.   This information is not intended to replace advice given to you by your health care provider. Make sure you discuss any questions you have with your health care provider.   Document Released: 02/20/2005 Document Revised: 11/11/2014 Document Reviewed:  12/10/2013 Elsevier Interactive Patient Education [employer]

## 2015-07-14 NOTE — Progress Notes (Signed)
Orthopedic Tech Progress Note Patient Details:  David [name] 09/25/1980 [id number] Applied fiberglass ulnar gutter splint to RUE.  Pulses, sensation, motion intact before and after splinting.  Capillary refill less than 2 seconds before and after splinting.  Placed splinted RUE in arm sling. Ortho Devices Type of Ortho Device: Ulna gutter splint, Arm sling Ortho Device/Splint Location: RUE Ortho Device/Splint Interventions: Application   [name] 07/14/2015, 3:07 PM

## 2015-07-14 NOTE — ED Notes (Signed)
Unable to get temp due to pt drinking cold water.
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
## 2015-07-14 NOTE — ED Notes (Signed)
Patient transported to X-ray 

## 2015-07-16 ENCOUNTER — Telehealth (HOSPITAL_COMMUNITY): Payer: Self-pay

## 2015-07-16 NOTE — Telephone Encounter (Signed)
Pharmacy calling pt tore top half of Rx off want to know what was torn off and if its ok to fill.  Pt prescribed Norco and ibuprofen. Informed pharmacist ok to just get Norco

## 2015-07-21 ENCOUNTER — Emergency Department (HOSPITAL_COMMUNITY)
Admission: EM | Admit: 2015-07-21 | Discharge: 2015-07-21 | Disposition: A | Discharge status: Home / Self Care | Payer: Self-pay | Attending: Emergency Medicine | Admitting: Emergency Medicine

## 2015-07-21 ENCOUNTER — Encounter (HOSPITAL_COMMUNITY): Payer: Self-pay | Admitting: Vascular Surgery

## 2015-07-21 DIAGNOSIS — E119 Type 2 diabetes mellitus without complications: Secondary | ICD-10-CM | POA: Insufficient documentation

## 2015-07-21 DIAGNOSIS — S62309D Unspecified fracture of unspecified metacarpal bone, subsequent encounter for fracture with routine healing: Secondary | ICD-10-CM

## 2015-07-21 DIAGNOSIS — Z794 Long term (current) use of insulin: Secondary | ICD-10-CM | POA: Insufficient documentation

## 2015-07-21 DIAGNOSIS — S62336D Displaced fracture of neck of fifth metacarpal bone, right hand, subsequent encounter for fracture with routine healing: Secondary | ICD-10-CM | POA: Insufficient documentation

## 2015-07-21 DIAGNOSIS — X58XXXD Exposure to other specified factors, subsequent encounter: Secondary | ICD-10-CM | POA: Insufficient documentation

## 2015-07-21 DIAGNOSIS — Z87891 Personal history of nicotine dependence: Secondary | ICD-10-CM | POA: Insufficient documentation

## 2015-07-21 DIAGNOSIS — Z791 Long term (current) use of non-steroidal anti-inflammatories (NSAID): Secondary | ICD-10-CM | POA: Insufficient documentation

## 2015-07-21 DIAGNOSIS — Z7984 Long term (current) use of oral hypoglycemic drugs: Secondary | ICD-10-CM | POA: Insufficient documentation

## 2015-07-21 MED ORDER — HYDROCODONE-ACETAMINOPHEN 5-325 MG PO TABS
1.0000 | ORAL_TABLET | Freq: Once | ORAL | Status: AC
  Administered 2015-07-21: 1 via ORAL
  Filled 2015-07-21: qty 1

## 2015-07-21 MED ORDER — NAPROXEN 500 MG PO TABS: 500.0000 mg | ORAL_TABLET | Freq: Two times a day (BID) | ORAL | Status: DC | PRN

## 2015-07-21 MED ORDER — HYDROCODONE-ACETAMINOPHEN 5-325 MG PO TABS: 1.0000 | ORAL_TABLET | Freq: Four times a day (QID) | ORAL | Status: DC | PRN

## 2015-07-21 NOTE — Discharge Instructions (Signed)
Wear hand splint at all times until you see the hand specialist. Ice and elevate hand throughout the day. Alternate between naprosyn and norco for pain relief. Do not drive or operate machinery with pain medication use. Call Dr. Glenna Durand office for follow up today or tomorrow to schedule followup appointment in 1 week. Follow up with your regular doctor in 1-2 weeks for ongoing management of your medical conditions. Return to the ER for changes or worsening symptoms.

## 2015-07-21 NOTE — Progress Notes (Signed)
Spoke to patient regarding primary care resources and the [facility] orange card.Orange card application provided and explained, pt instructed to contact me once application was complete for an eligibility appointment. Resource guide and my contact information provided for any future questions or concerns. No other [facility] Specialist needs identified at this time.   Buddy Duty [facility] Elig[facility] Specialist [id number] [phone]
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
## 2015-07-21 NOTE — ED Notes (Signed)
Pt reports to the ED for eval of right hand pain. Pt was seen here a week ago and was told that he broke his hand and referred to the hand specialist. He was unable to f/u with them however because they require payment up front. Was given Vicodin but he has taken all of it and he still has pain. Ortho device in place. Pt A&Ox4, resp e/u, and skin warm and dry.

## 2015-07-23 ENCOUNTER — Other Ambulatory Visit: Payer: Self-pay | Admitting: Orthopedic Surgery

## 2015-07-26 ENCOUNTER — Encounter (HOSPITAL_BASED_OUTPATIENT_CLINIC_OR_DEPARTMENT_OTHER): Payer: Self-pay | Admitting: *Deleted

## 2015-07-26 NOTE — Pre-Procedure Instructions (Signed)
To come for CMET and EKG

## 2015-07-27 ENCOUNTER — Telehealth: Payer: Self-pay | Admitting: Internal Medicine

## 2015-07-27 ENCOUNTER — Encounter (HOSPITAL_BASED_OUTPATIENT_CLINIC_OR_DEPARTMENT_OTHER)
Admission: RE | Admit: 2015-07-27 | Discharge: 2015-07-27 | Disposition: A | Discharge status: Home / Self Care | Payer: Self-pay | Source: Ambulatory Visit | Attending: Orthopedic Surgery | Admitting: Orthopedic Surgery

## 2015-07-27 LAB — COMPREHENSIVE METABOLIC PANEL
ALBUMIN: 3.9 g/dL (ref 3.5–5.0)
ALT: 31 U/L (ref 17–63)
ANION GAP: 14 (ref 5–15)
AST: 71 U/L — AB (ref 15–41)
Alkaline Phosphatase: 78 U/L (ref 38–126)
BILIRUBIN TOTAL: 1.1 mg/dL (ref 0.3–1.2)
BUN: 13 mg/dL (ref 6–20)
CHLORIDE: 96 mmol/L — AB (ref 101–111)
CO2: 21 mmol/L — ABNORMAL LOW (ref 22–32)
Calcium: 9.3 mg/dL (ref 8.9–10.3)
Creatinine, Ser: 1.06 mg/dL (ref 0.61–1.24)
GFR calc Af Amer: >60 mL/min (ref >60)
Glucose, Bld: 671 mg/dL (ref 65–99)
POTASSIUM: 5.1 mmol/L (ref 3.5–5.1)
Sodium: 131 mmol/L — ABNORMAL LOW (ref 135–145)
TOTAL PROTEIN: 7.1 g/dL (ref 6.5–8.1)

## 2015-07-27 NOTE — Telephone Encounter (Signed)
PATIENT was scheduled for surgery tomorrow, surgery was canceled due to high blood sugar level of 671....  Patient was referred to go to the ED and surgery was canceled

## 2015-07-27 NOTE — Pre-Procedure Instructions (Addendum)
Received call from lab with glucose value of 671 - Dr. Malen Gauze notified - pt. needs to have diabetes under control prior to surgery.  Helmut Muster and Molly Maduro Dasnoit at Dr. Ronie Spies office notified - will cancel surgery.  Pt. notified and advised to go to the Emergency Dept. Immediately.  Spoke with Porfirio Mylar at [facility], who states that pt. has not been seen there in about a year.  Informed her of his blood sugar and that we advised him to go to the ER now.

## 2015-07-28 ENCOUNTER — Ambulatory Visit (HOSPITAL_BASED_OUTPATIENT_CLINIC_OR_DEPARTMENT_OTHER)
Admission: RE | Admit: 2015-07-28 | Discharge: 2015-07-28 | Disposition: A | Discharge status: Home / Self Care | Payer: Self-pay | Source: Ambulatory Visit | Attending: Orthopedic Surgery | Admitting: Orthopedic Surgery

## 2015-07-28 ENCOUNTER — Ambulatory Visit: Payer: Self-pay | Attending: Internal Medicine | Admitting: Internal Medicine

## 2015-07-28 ENCOUNTER — Encounter (HOSPITAL_BASED_OUTPATIENT_CLINIC_OR_DEPARTMENT_OTHER)
Admission: RE | Disposition: A | Discharge status: Home / Self Care | Payer: Self-pay | Source: Ambulatory Visit | Attending: Orthopedic Surgery

## 2015-07-28 ENCOUNTER — Encounter: Payer: Self-pay | Admitting: Internal Medicine

## 2015-07-28 VITALS — BP 116/79 | HR 88 | Temp 98.0°F | Wt 153.0 lb

## 2015-07-28 DIAGNOSIS — X58XXXA Exposure to other specified factors, initial encounter: Secondary | ICD-10-CM | POA: Insufficient documentation

## 2015-07-28 DIAGNOSIS — Z794 Long term (current) use of insulin: Secondary | ICD-10-CM | POA: Insufficient documentation

## 2015-07-28 DIAGNOSIS — E1165 Type 2 diabetes mellitus with hyperglycemia: Secondary | ICD-10-CM | POA: Insufficient documentation

## 2015-07-28 DIAGNOSIS — R739 Hyperglycemia, unspecified: Secondary | ICD-10-CM

## 2015-07-28 DIAGNOSIS — E119 Type 2 diabetes mellitus without complications: Secondary | ICD-10-CM

## 2015-07-28 DIAGNOSIS — S62309A Unspecified fracture of unspecified metacarpal bone, initial encounter for closed fracture: Secondary | ICD-10-CM | POA: Insufficient documentation

## 2015-07-28 DIAGNOSIS — Z9114 Patient's other noncompliance with medication regimen: Secondary | ICD-10-CM | POA: Insufficient documentation

## 2015-07-28 HISTORY — DX: Unspecified convulsions: R56.9

## 2015-07-28 HISTORY — DX: Unspecified fracture of unspecified metacarpal bone, initial encounter for closed fracture: S62.309A

## 2015-07-28 HISTORY — DX: Type 2 diabetes mellitus without complications: E11.9

## 2015-07-28 LAB — POCT URINALYSIS DIP (MANUAL ENTRY)
BILIRUBIN UA: NEGATIVE
Blood, UA: NEGATIVE
LEUKOCYTES UA: NEGATIVE
NITRITE UA: NEGATIVE
Protein Ur, POC: NEGATIVE
Spec Grav, UA: 1.01
Urobilinogen, UA: 0.2
pH, UA: 5.5

## 2015-07-28 LAB — GLUCOSE, POCT (MANUAL RESULT ENTRY): POC Glucose: 392 mg/dl — AB (ref 70–99)

## 2015-07-28 SURGERY — CLOSED REDUCTION, FRACTURE, METACARPAL BONE, WITH PERCUTANEOUS PINNING
Anesthesia: General | Laterality: Right

## 2015-07-28 MED ORDER — INSULIN GLARGINE 100 UNIT/ML SOLOSTAR PEN: 20.0000 [IU] | PEN_INJECTOR | Freq: Every day | SUBCUTANEOUS | Status: DC

## 2015-07-28 MED ORDER — METFORMIN HCL 500 MG PO TABS: 500.0000 mg | ORAL_TABLET | Freq: Two times a day (BID) | ORAL | Status: DC

## 2015-07-28 MED ORDER — GLUCOSE BLOOD VI STRP: ORAL_STRIP | Status: DC

## 2015-07-28 MED ORDER — BLOOD GLUCOSE MONITOR KIT: PACK | Status: DC

## 2015-07-28 MED ORDER — INSULIN ASPART 100 UNIT/ML ~~LOC~~ SOLN
20.0000 [IU] | Freq: Once | SUBCUTANEOUS | Status: AC
  Administered 2015-07-28: 20 [IU] via SUBCUTANEOUS

## 2015-07-28 MED ORDER — FREESTYLE LANCETS MISC: Status: DC

## 2015-07-28 MED FILL — ?METFORMIN HCL 500MG TABLET: 500 | 30 days supply | Qty: 60 | Fill #0

## 2015-07-28 MED FILL — !LANTUS SOLOSTAR 100UNITS/M: 100 | 30 days supply | Qty: 3 | Fill #0

## 2015-07-28 MED FILL — TRUE METRIX TEST STRIP: 30 days supply | Qty: 100 | Fill #0

## 2015-07-28 MED FILL — TRUEplus LANCETS 28G MISC: 30 days supply | Qty: 100 | Fill #0

## 2015-07-28 NOTE — Patient Instructions (Signed)
It was good to see you today.  We have reviewed your prior records including labs and tests today  Medications reviewed and updated  Resume Lantus insulin 20Units each day Also resume metformin 500mg  pill twice daily Refill on medication(s) as discussed today.  Check your sugar twice daily before meals and call if sugars remain over 200 or less than 100 for other dose adjustments as needed  Please schedule followup in 1-2 weeks to recheck sugar and adjust medications, call sooner if problems so that we can get you rescheduled for surgery as discussed  RECOMMENDATIONS TO REDUCE YOUR RISK OF DIABETIC COMPLICATIONS:  * Take your prescribed MEDICATION(S).  * Follow a DIABETIC diet: Complex carbs, fiber rich foods, heart healthy fish twice weekly, (monounsaturated and polyunsaturated) fats  * AVOID saturated/trans fats, high fat foods, >2,300 mg salt per day.  * EXERCISE at least 5 times a week for 30 minutes or preferably daily.  * DO NOT SMOKE OR DRINK more than 1 drink a day.  * Check your FEET every day. Do not wear tightfitting shoes. Contact us if you develop an ulcer  * See your EYE doctor once a year or more if needed  * Get a FLU shot once a year  * Get a PNEUMONIA vaccine once before and once after age 38 years  GOALS:  * Your Hemoglobin A1c of <7%  * Your Systolic BP should be 140 or lower  * Your Diastolic BP should be 80 or lower  * Your HDL (Good Cholesterol) should be 40 or higher  * Your LDL (Bad Cholesterol) should be 100 or lower  * Your Triglycerides should be 150 or lower  * Your Urine microalbumin (kidney function) should be <30  * Your Body Mass Index should be 25 or lower

## 2015-07-28 NOTE — Progress Notes (Signed)
   Subjective:    Patient ID: David [name], male    DOB: [birth year], 35 y.o.   MRN: [id number]  HPI  Patient here for uncontrolled DM Elective hand surg for fx repair cancelled yesterday because cbg 671 Not taking meds as rx'd - needs refills and supplies for monitoring  Past Medical History  Diagnosis Date  . Diabetes mellitus ([place])   . 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 Metacarpal bone fracture 07/11/2015    right small    Review of Systems  Constitutional: Negative for fatigue and unexpected weight change.  Respiratory: Negative for cough and shortness of breath.   Cardiovascular: Negative for chest pain and leg swelling.       Objective:    Physical Exam  Constitutional: He appears well-developed and well-nourished. No distress.  Cardiovascular: Normal rate, regular rhythm and normal heart sounds.   No murmur heard. Pulmonary/Chest: Effort normal and breath sounds normal. No respiratory distress.  Vitals reviewed.   BP 116/79 mmHg  Pulse 88  Temp(Src) 98 F (36.7 C) (Oral)  Wt 153 lb (69.4 kg) Wt Readings from Last 3 Encounters:  07/26/15 145 lb (65.772 kg)  07/28/15 153 lb (69.4 kg)  06/15/14 182 lb (82.555 kg)     Lab Results  Component Value Date   WBC 7.6 06/11/2014   HGB 15.5 06/11/2014   HCT 44.8 06/11/2014   PLT 288 06/11/2014   GLUCOSE 671* 07/27/2015   CHOL 93 12/02/2013   TRIG 122 12/02/2013   HDL 38* 12/02/2013   LDLCALC 31 12/02/2013   ALT 31 07/27/2015   AST 71* 07/27/2015   NA 131* 07/27/2015   K 5.1 07/27/2015   CL 96* 07/27/2015   CREATININE 1.06 07/27/2015   BUN 13 07/27/2015   CO2 21* 07/27/2015   HGBA1C 14.90 06/15/2014    No results found.     Assessment & Plan:   Problem List Items Addressed This Visit    Type 2 diabetes mellitus without complication ([place]) - Primary    Non adherent to prior therapies but willing to resume same now Elective hand surgery for Boxer's fx R  hand cancelled yesterday 5/23 due to glc 671 Pt asymptomatic and declined ED as referred Will resume Lantus and metformin BID now Renew supplies to monitor cbgs at home Pt to call if >200 or <100 and ROV in 2 weeks for clearance Will need repeat DM labs (a1c, lipids, urine) at ROV Pt understands and agrees      Relevant Medications   insulin aspart (novoLOG) injection 20 Units (Completed)   Insulin Glargine (LANTUS SOLOSTAR) 100 UNIT/ML Solostar Pen   Lancets (FREESTYLE) lancets   blood glucose meter kit and supplies KIT   glucose blood (ACCU-CHEK AVIVA) test strip   metFORMIN (GLUCOPHAGE) 500 MG tablet    Other Visit Diagnoses    Hyperglycemia        Relevant Medications    insulin aspart (novoLOG) injection 20 Units (Completed)    Other Relevant Orders    POCT glucose (manual entry) (Completed)    POCT urinalysis dipstick (Completed)        Gwendolyn Grant, MD

## 2015-07-28 NOTE — Assessment & Plan Note (Signed)
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

## 2015-08-18 ENCOUNTER — Other Ambulatory Visit: Payer: Self-pay | Admitting: *Deleted

## 2015-08-18 MED ORDER — INSULIN GLARGINE 100 UNIT/ML SOLOSTAR PEN: 20.0000 [IU] | PEN_INJECTOR | Freq: Every day | SUBCUTANEOUS | Status: DC

## 2015-08-18 NOTE — Telephone Encounter (Signed)
MAPS PROGRAM 

## 2015-08-19 ENCOUNTER — Ambulatory Visit: Payer: Self-pay | Admitting: Internal Medicine

## 2016-02-26 ENCOUNTER — Emergency Department (HOSPITAL_COMMUNITY)
Admission: EM | Admit: 2016-02-26 | Discharge: 2016-02-26 | Disposition: A | Discharge status: Home / Self Care | Payer: Self-pay | Attending: Emergency Medicine | Admitting: Emergency Medicine

## 2016-02-26 ENCOUNTER — Ambulatory Visit (HOSPITAL_COMMUNITY)
Admission: EM | Admit: 2016-02-26 | Discharge: 2016-02-26 | Disposition: A | Discharge status: Home / Self Care | Payer: Self-pay | Attending: Emergency Medicine | Admitting: Emergency Medicine

## 2016-02-26 ENCOUNTER — Encounter (HOSPITAL_COMMUNITY): Payer: Self-pay

## 2016-02-26 DIAGNOSIS — M79605 Pain in left leg: Secondary | ICD-10-CM

## 2016-02-26 DIAGNOSIS — M79672 Pain in left foot: Secondary | ICD-10-CM | POA: Insufficient documentation

## 2016-02-26 DIAGNOSIS — Z5321 Procedure and treatment not carried out due to patient leaving prior to being seen by health care provider: Secondary | ICD-10-CM | POA: Insufficient documentation

## 2016-02-26 DIAGNOSIS — M79671 Pain in right foot: Secondary | ICD-10-CM | POA: Insufficient documentation

## 2016-02-26 DIAGNOSIS — E1140 Type 2 diabetes mellitus with diabetic neuropathy, unspecified: Secondary | ICD-10-CM

## 2016-02-26 DIAGNOSIS — Z794 Long term (current) use of insulin: Secondary | ICD-10-CM | POA: Insufficient documentation

## 2016-02-26 DIAGNOSIS — F17210 Nicotine dependence, cigarettes, uncomplicated: Secondary | ICD-10-CM | POA: Insufficient documentation

## 2016-02-26 DIAGNOSIS — E119 Type 2 diabetes mellitus without complications: Secondary | ICD-10-CM | POA: Insufficient documentation

## 2016-02-26 DIAGNOSIS — M79604 Pain in right leg: Secondary | ICD-10-CM

## 2016-02-26 LAB — CBC
HEMATOCRIT: 43.9 % (ref 39.0–52.0)
HEMOGLOBIN: 15.5 g/dL (ref 13.0–17.0)
MCH: 29.9 pg (ref 26.0–34.0)
MCHC: 35.3 g/dL (ref 30.0–36.0)
MCV: 84.6 fL (ref 78.0–100.0)
Platelets: 250 10*3/uL (ref 150–400)
RBC: 5.19 MIL/uL (ref 4.22–5.81)
RDW: 12.2 % (ref 11.5–15.5)
WBC: 6.3 10*3/uL (ref 4.0–10.5)

## 2016-02-26 LAB — BASIC METABOLIC PANEL
ANION GAP: 10 (ref 5–15)
BUN: 13 mg/dL (ref 6–20)
CHLORIDE: 103 mmol/L (ref 101–111)
CO2: 23 mmol/L (ref 22–32)
Calcium: 9.4 mg/dL (ref 8.9–10.3)
Creatinine, Ser: 0.78 mg/dL (ref 0.61–1.24)
GFR calc Af Amer: >60 mL/min (ref >60)
GFR calc non Af Amer: >60 mL/min (ref >60)
Glucose, Bld: 208 mg/dL — ABNORMAL HIGH (ref 65–99)
POTASSIUM: 3.7 mmol/L (ref 3.5–5.1)
SODIUM: 136 mmol/L (ref 135–145)

## 2016-02-26 LAB — CBG MONITORING, ED: Glucose-Capillary: 224 mg/dL — ABNORMAL HIGH (ref 65–99)

## 2016-02-26 MED ORDER — GABAPENTIN 300 MG PO CAPS: 300.0000 mg | ORAL_CAPSULE | Freq: Three times a day (TID) | ORAL | 0 refills | Status: DC

## 2016-02-26 NOTE — ED Triage Notes (Signed)
Onset 1 week bilateral foot pain, feels like "needles".  Pt reports he is on insulin and pills for diabetes but does not take it regulary.

## 2016-02-26 NOTE — ED Triage Notes (Signed)
Pt has a hx of diabetes and having pain in his feet and both legs. Describes it as shooting pain and said it comes and goes.

## 2016-02-26 NOTE — ED Provider Notes (Signed)
CSN: [id number]     Arrival date & time 02/26/16  1755 History   First MD Initiated Contact with Patient 02/26/16 1842     Chief Complaint  Patient presents with  . Foot Pain   (Consider location/radiation/quality/duration/timing/severity/associated sxs/prior Treatment) Patient has pain and numbness in his bilateral feet.  He has uncontrolled diabetes.   The history is provided by the patient.  Diabetes  This is a chronic problem. The current episode started 2 days ago. The problem occurs constantly. The problem has not changed since onset.Nothing aggravates the symptoms. Nothing relieves the symptoms. He has tried nothing for the symptoms.    Past Medical History:  Diagnosis Date  . Diabetes mellitus ([place])   . Metacarpal bone fracture 07/11/2015   right small  . 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 NO PAST SURGERIES     Family History  Problem Relation Age of Onset  . Diabetes Father    Social History  Substance Use Topics  . Smoking status: Current Every Day Smoker    Packs/day: 0.50    Years: 10.00    Types: Cigarettes    Last attempt to quit: 10/27/2013  . Smokeless tobacco: Never Used  . Alcohol use Yes     Comment: one pint daily    Review of Systems  Constitutional: Negative.   Eyes: Negative.   Cardiovascular: Negative.   Gastrointestinal: Negative.   Endocrine: Negative.   Genitourinary: Negative.   Allergic/Immunologic: Negative.   Neurological: Positive for numbness.  Hematological: Negative.     Allergies  Patient has no known allergies.  Home Medications   Prior to Admission medications   Medication Sig Start Date End Date Taking? Authorizing Provider  blood glucose meter kit and supplies KIT Dispense based on patient and insurance preference. Use as directed. 07/28/15  Yes Rowe Clack, MD  glucose blood (ACCU-CHEK AVIVA) test strip Use as  instructed 07/28/15  Yes Rowe Clack, MD  gabapentin (NEURONTIN) 300 MG capsule Take 1 capsule (300 mg total) by mouth 3 (three) times daily. 02/26/16   Lysbeth Penner, FNP  Insulin Glargine (LANTUS SOLOSTAR) 100 UNIT/ML Solostar Pen Inject 20 Units into the skin daily at 10 pm. 08/18/15   Tresa Garter, MD  Insulin Pen Needle 29G X 12.7MM MISC 10 Units by Does not apply route at bedtime. 06/15/14   Tresa Garter, MD  Lancets (FREESTYLE) lancets Use as instructed 07/28/15   Rowe Clack, MD  metFORMIN (GLUCOPHAGE) 500 MG tablet Take 1 tablet (500 mg total) by mouth 2 (two) times daily with a meal. 07/28/15   Rowe Clack, MD  naproxen (NAPROSYN) 500 MG tablet Take 1 tablet (500 mg total) by mouth 2 (two) times daily as needed for mild pain, moderate pain or headache (TAKE WITH MEALS.). 07/21/15   Mercedes Camprubi-Soms, PA-C   Meds Ordered and Administered this Visit  Medications - No data to display  BP (!) 152/113 (BP Location: Left Arm)   Pulse 84   Temp 98.1 F (36.7 C) (Oral)   Resp 16   SpO2 100%  No data found.   Physical Exam  Constitutional: He appears well-developed and well-nourished.  HENT:  Head: Normocephalic and atraumatic.  Mouth/Throat: Oropharynx is clear and moist.  Eyes: EOM are normal. Pupils are equal, round, and reactive to light.  Neck: Normal range of motion. Neck supple.  Cardiovascular: Normal rate, regular rhythm and normal heart sounds.   Pulmonary/Chest: Effort normal and breath sounds normal.  Abdominal: Soft. Bowel sounds are normal.  Skin:  Numbness and discomfort bilateral LE's and feet.  Nursing note and vitals reviewed.   Urgent Care Course   Clinical Course     Procedures (including critical care time)  Labs Review Labs Reviewed - No data to display  Imaging Review No results found.   Visual Acuity Review  Right Eye Distance:   Left Eye Distance:   Bilateral Distance:    Right Eye Near:   Left Eye  Near:    Bilateral Near:         MDM   1. Pain in both lower extremities   2. Diabetic neuropathy, painful (HCC)    Gabapentin 325m one po tid #90 Follow up with PCP.      WLysbeth Penner FNP 02/26/16 1Pauline Aus

## 2016-02-26 NOTE — ED Notes (Signed)
David [name], Diplomatic Services operational officer received call from urgent care, pt is there to be seen.

## 2016-02-26 NOTE — Discharge Instructions (Signed)
Diabetic Neuropathy Diabetic neuropathy is a nerve disease or nerve damage that is caused by diabetes mellitus. About half of all people with diabetes mellitus have some form of nerve damage. Nerve damage is more common in those who have had diabetes mellitus for many years and who generally have not had good control of their blood sugar (glucose) level. Diabetic neuropathy is a common complication of diabetes mellitus. There are three common types of diabetic neuropathy and a fourth type that is less common and less understood:  Peripheral neuropathy-This is the most common type of diabetic neuropathy. It causes damage to the nerves of the feet and legs first and then eventually the hands and arms. The damage affects the ability to sense touch.  Autonomic neuropathy-This type causes damage to the autonomic nervous system, which controls the following functions: ? Heartbeat. ? Body temperature. ? Blood pressure. ? Urination. ? Digestion. ? Sweating. ? Sexual function.  Focal neuropathy-Focal neuropathy can be painful and unpredictable and occurs most often in older adults with diabetes mellitus. It involves a specific nerve or one area and often comes on suddenly. It usually does not cause long-term problems.  Radiculoplexus neuropathy- Sometimes called lumbosacral radiculoplexus neuropathy, radiculoplexus neuropathy affects the nerves of the thighs, hips, buttocks, or legs. It is more common in people with type 2 diabetes mellitus and in older men. It is characterized by debilitating pain, weakness, and atrophy, usually in the thigh muscles.  What are the causes? The cause of peripheral, autonomic, and focal neuropathies is diabetes mellitus that is uncontrolled and high glucose levels. The cause of radiculoplexus neuropathy is unknown. However, it is thought to be caused by inflammation related to uncontrolled glucose levels. What are the signs or symptoms? Peripheral Neuropathy Peripheral  neuropathy develops slowly over time. When the nerves of the feet and legs no longer work there may be:  Burning, stabbing, or aching pain in the legs or feet.  Inability to feel pressure or pain in your feet. This can lead to: ? Thick calluses over pressure areas. ? Pressure sores. ? Ulcers.  Foot deformities.  Reduced ability to feel temperature changes.  Muscle weakness.  Autonomic Neuropathy The symptoms of autonomic neuropathy vary depending on which nerves are affected. Symptoms may include:  Problems with digestion, such as: ? Feeling sick to your stomach (nausea). ? Vomiting. ? Bloating. ? Constipation. ? Diarrhea. ? Abdominal pain.  Difficulty with urination. This occurs if you lose your ability to sense when your bladder is full. Problems include: ? Urine leakage (incontinence). ? Inability to empty your bladder completely (retention).  Rapid or irregular heartbeat (palpitations).  Blood pressure drops when you stand up (orthostatic hypotension). When you stand up you may feel: ? Dizzy. ? Weak. ? Faint.  In men, inability to attain and maintain an erection.  In women, vaginal dryness and problems with decreased sexual desire and arousal.  Problems with body temperature regulation.  Increased or decreased sweating.  Focal Neuropathy  Abnormal eye movements or abnormal alignment of both eyes.  Weakness in the wrist.  Foot drop. This results in an inability to lift the foot properly and abnormal walking or foot movement.  Paralysis on one side of your face (Bell palsy).  Chest or abdominal pain. Radiculoplexus Neuropathy  Sudden, severe pain in your hip, thigh, or buttocks.  Weakness and wasting of thigh muscles.  Difficulty rising from a seated position.  Abdominal swelling.  Unexplained weight loss (usually more than 10 lb [4.5 kg]). How is   this diagnosed? Peripheral Neuropathy Your senses may be tested. Sensory function testing can be  done with:  A light touch using a monofilament.  A vibration with tuning fork.  A sharp sensation with a pin prick.  Other tests that can help diagnose neuropathy are:  Nerve conduction velocity. This test checks the transmission of an electrical current through a nerve.  Electromyography. This shows how muscles respond to electrical signals transmitted by nearby nerves.  Quantitative sensory testing. This is used to assess how your nerves respond to vibrations and changes in temperature.  Autonomic Neuropathy Diagnosis is often based on reported symptoms. Tell your health care provider if you experience:  Dizziness.  Constipation.  Diarrhea.  Inappropriate urination or inability to urinate.  Inability to get or maintain an erection.  Tests that may be done include:  Electrocardiography or Holter monitor. These are tests that can help show problems with the heart rate or heart rhythm.  An X-ray exam may be done.  Focal Neuropathy Diagnosis is made based on your symptoms and what your health care provider finds during your exam. Other tests may be done. They may include:  Nerve conduction velocities. This checks the transmission of electrical current through a nerve.  Electromyography. This shows how muscles respond to electrical signals transmitted by nearby nerves.  Quantitative sensory testing. This test is used to assess how your nerves respond to vibration and changes in temperature.  Radiculoplexus Neuropathy  Often the first thing is to eliminate any other issue or problems that might be the cause, as there is no standard test for diagnosis.  X-ray exam of your spine and lumbar region.  Spinal tap to rule out cancer.  MRI to rule out other lesions. How is this treated? Once nerve damage occurs, it cannot be reversed. The goal of treatment is to keep the disease or nerve damage from getting worse and affecting more nerve fibers. Controlling your blood  glucose level is the key. Most people with radiculoplexus neuropathy see at least a partial improvement over time. You will need to keep your blood glucose and HbA1c levels in the target range determined by your health care provider. Things that help control blood glucose levels include:  Blood glucose monitoring.  Meal planning.  Physical activity.  Diabetes medicine.  Over time, maintaining lower blood glucose levels helps lessen symptoms. Sometimes, prescription pain medicine is needed. Follow these instructions at home:  Do not smoke.  Keep your blood glucose level in the range that you and your health care provider have determined acceptable for you.  Keep your blood pressure level in the range that you and your health care provider have determined acceptable for you.  Eat a well-balanced diet.  Be physically active every day. Include strength training and balance exercises.  Protect your feet. ? Check your feet every day for sores, cuts, blisters, or signs of infection. ? Wear padded socks and supportive shoes. Use orthotic inserts, if necessary. ? Regularly check the insides of your shoes for worn spots. Make sure there are no rocks or other items inside your shoes before you put them on. Contact a health care provider if:  You have burning, stabbing, or aching pain in the legs or feet.  You are unable to feel pressure or pain in your feet.  You develop problems with digestion such as: ? Nausea. ? Vomiting. ? Bloating. ? Constipation. ? Diarrhea. ? Abdominal pain.  You have difficulty with urination, such as: ? Incontinence. ? Retention.    You have palpitations.  You develop orthostatic hypotension. When you stand up you may feel: ? Dizzy. ? Weak. ? Faint.  You cannot attain and maintain an erection (in men).  You have vaginal dryness and problems with decreased sexual desire and arousal (in women).  You have severe pain in your thighs, legs, or  buttocks.  You have unexplained weight loss. This information is not intended to replace advice given to you by your health care provider. Make sure you discuss any questions you have with your health care provider. Document Released: 05/01/2001 Document Revised: 07/29/2015 Document Reviewed: 08/01/2012 Elsevier Interactive Patient Education  2017 [employer]  

## 2016-03-14 ENCOUNTER — Ambulatory Visit (HOSPITAL_COMMUNITY)
Admission: EM | Admit: 2016-03-14 | Discharge: 2016-03-14 | Disposition: A | Discharge status: Home / Self Care | Payer: Self-pay | Attending: Family Medicine | Admitting: Family Medicine

## 2016-03-14 ENCOUNTER — Encounter (HOSPITAL_COMMUNITY): Payer: Self-pay | Admitting: *Deleted

## 2016-03-14 DIAGNOSIS — E1142 Type 2 diabetes mellitus with diabetic polyneuropathy: Secondary | ICD-10-CM

## 2016-03-14 DIAGNOSIS — M79605 Pain in left leg: Secondary | ICD-10-CM

## 2016-03-14 DIAGNOSIS — M79604 Pain in right leg: Secondary | ICD-10-CM

## 2016-03-14 DIAGNOSIS — Z794 Long term (current) use of insulin: Secondary | ICD-10-CM

## 2016-03-14 DIAGNOSIS — E1140 Type 2 diabetes mellitus with diabetic neuropathy, unspecified: Secondary | ICD-10-CM

## 2016-03-14 DIAGNOSIS — R030 Elevated blood-pressure reading, without diagnosis of hypertension: Secondary | ICD-10-CM

## 2016-03-14 NOTE — ED Triage Notes (Signed)
Pt  Was  Seen ucc   fpr  Peripheral  Neuropathy  He  Reports      He  Was rx    neurontin         He     Has  No  pcp   And  His  bp  ie  Elevated     He  Is  A  Diabetic         He  Is  Awake  And  alerty   And  Oriented  And  Appears  To be  In severe  Acute  Distress

## 2016-03-14 NOTE — Discharge Instructions (Signed)
Start taking 2 gabapentin twice a day. Call your primary care provider tomorrow for an appointment soon as possible. Read instructions attached to this paper. He will need additional adjustments and your insulin and other medications to control your diabetes.

## 2016-03-14 NOTE — ED Provider Notes (Signed)
CSN: [id number]     Arrival date & time 03/14/16  1542 History   First MD Initiated Contact with Patient 03/14/16 1727     Chief Complaint  Patient presents with  . Leg Pain   (Consider location/radiation/quality/duration/timing/severity/associated sxs/prior Treatment) 36 year old male with history of type 2 diabetes mellitus and medical noncompliance presents for the second time in 2 weeks for diabetic neuropathic pain. He was advised and May 2017 to follow-up with the community health and [place] however he fell to do that thinking that that was just referral area and not his PCP. His blood pressures are elevated today and blood sugars have also been elevated. He states he is taking 18 units of Lantus and metformin daily. He is complaining of leg pain and numbness similar to that of which she was complaining when here before. He states he is taking his gabapentin but it is not working.      Past Medical History:  Diagnosis Date  . Diabetes mellitus ([place])   . Metacarpal bone fracture 07/11/2015   right small  . 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 NO PAST SURGERIES     Family History  Problem Relation Age of Onset  . Diabetes Father    Social History  Substance Use Topics  . Smoking status: Current Every Day Smoker    Packs/day: 0.50    Years: 10.00    Types: Cigarettes    Last attempt to quit: 10/27/2013  . Smokeless tobacco: Never Used  . Alcohol use Yes     Comment: one pint daily    Review of Systems  Constitutional: Negative.   HENT: Negative.   Respiratory: Negative.   Gastrointestinal: Negative.   Genitourinary: Negative.   Skin: Negative.   Neurological: Positive for numbness.  All other systems reviewed and are negative.   Allergies  Patient has no known allergies.  Home Medications   Prior to Admission medications   Medication Sig Start Date End Date  Taking? Authorizing Provider  blood glucose meter kit and supplies KIT Dispense based on patient and insurance preference. Use as directed. 07/28/15   Rowe Clack, MD  gabapentin (NEURONTIN) 300 MG capsule Take 1 capsule (300 mg total) by mouth 3 (three) times daily. 02/26/16   Lysbeth Penner, FNP  glucose blood (ACCU-CHEK AVIVA) test strip Use as instructed 07/28/15   Rowe Clack, MD  Insulin Glargine (LANTUS SOLOSTAR) 100 UNIT/ML Solostar Pen Inject 20 Units into the skin daily at 10 pm. 08/18/15   Tresa Garter, MD  Insulin Pen Needle 29G X 12.7MM MISC 10 Units by Does not apply route at bedtime. 06/15/14   Tresa Garter, MD  Lancets (FREESTYLE) lancets Use as instructed 07/28/15   Rowe Clack, MD  metFORMIN (GLUCOPHAGE) 500 MG tablet Take 1 tablet (500 mg total) by mouth 2 (two) times daily with a meal. 07/28/15   Rowe Clack, MD  naproxen (NAPROSYN) 500 MG tablet Take 1 tablet (500 mg total) by mouth 2 (two) times daily as needed for mild pain, moderate pain or headache (TAKE WITH MEALS.). 07/21/15   [place], PA-C   Meds Ordered and Administered this Visit  Medications - No data to display  BP (!) 186/120 (BP Location: Left Arm)   Pulse 78   Temp 98.6 F (37 C) (Oral)   Resp 18   SpO2  100%  No data found.   Physical Exam  Constitutional: He is oriented to person, place, and time. He appears well-developed and well-nourished. No distress.  Neck: Neck supple.  Cardiovascular: Normal rate.   Pulmonary/Chest: Effort normal.  Musculoskeletal: Normal range of motion. He exhibits no edema.  Neurological: He is alert and oriented to person, place, and time.  Skin: Skin is warm and dry.  Nursing note and vitals reviewed.   Urgent Care Course   Clinical Course     Procedures (including critical care time)  Labs Review Labs Reviewed - No data to display  Imaging Review No results found.   Visual Acuity Review  Right Eye  Distance:   Left Eye Distance:   Bilateral Distance:    Right Eye Near:   Left Eye Near:    Bilateral Near:         MDM   1. Type 2 diabetes mellitus with diabetic neuropathy, with long-term current use of insulin ([place])   2. Diabetic polyneuropathy associated with type 2 diabetes mellitus ([place])   3. Bilateral leg pain   4. Elevated BP without diagnosis of hypertension   Medically noncompliant male who has not been following up with his PCP. He continues to smoke in association with his uncontrolled diabetes and hyperglycemia and diabetic neuropathy. It was explained that it takes a few weeks for the gabapentin to take effect. Other medications that may be used for neuropathic pain would have to be prescribed by his PCP. Start taking 2 gabapentin twice a day. Call your primary care provider tomorrow for an appointment soon as possible. Read instructions attached to this paper. He will need additional adjustments and your insulin and other medications to control your diabetes.     Janne Napoleon, NP 03/14/16 1757

## 2016-03-22 ENCOUNTER — Inpatient Hospital Stay: Payer: Self-pay | Admitting: Internal Medicine

## 2016-04-05 ENCOUNTER — Encounter: Payer: Self-pay | Admitting: Internal Medicine

## 2016-04-05 ENCOUNTER — Ambulatory Visit: Payer: Self-pay | Attending: Internal Medicine | Admitting: Internal Medicine

## 2016-04-05 VITALS — BP 136/110 | HR 105 | Temp 98.6°F | Resp 16 | Wt 141.6 lb

## 2016-04-05 DIAGNOSIS — Z79899 Other long term (current) drug therapy: Secondary | ICD-10-CM | POA: Insufficient documentation

## 2016-04-05 DIAGNOSIS — I152 Hypertension secondary to endocrine disorders: Secondary | ICD-10-CM | POA: Insufficient documentation

## 2016-04-05 DIAGNOSIS — F1010 Alcohol abuse, uncomplicated: Secondary | ICD-10-CM | POA: Insufficient documentation

## 2016-04-05 DIAGNOSIS — E118 Type 2 diabetes mellitus with unspecified complications: Secondary | ICD-10-CM | POA: Insufficient documentation

## 2016-04-05 DIAGNOSIS — M79671 Pain in right foot: Secondary | ICD-10-CM | POA: Insufficient documentation

## 2016-04-05 DIAGNOSIS — M79672 Pain in left foot: Secondary | ICD-10-CM | POA: Insufficient documentation

## 2016-04-05 DIAGNOSIS — E119 Type 2 diabetes mellitus without complications: Secondary | ICD-10-CM

## 2016-04-05 DIAGNOSIS — I10 Essential (primary) hypertension: Secondary | ICD-10-CM | POA: Insufficient documentation

## 2016-04-05 DIAGNOSIS — F10239 Alcohol dependence with withdrawal, unspecified: Secondary | ICD-10-CM | POA: Insufficient documentation

## 2016-04-05 DIAGNOSIS — R200 Anesthesia of skin: Secondary | ICD-10-CM | POA: Insufficient documentation

## 2016-04-05 DIAGNOSIS — F17210 Nicotine dependence, cigarettes, uncomplicated: Secondary | ICD-10-CM | POA: Insufficient documentation

## 2016-04-05 DIAGNOSIS — Z794 Long term (current) use of insulin: Secondary | ICD-10-CM | POA: Insufficient documentation

## 2016-04-05 DIAGNOSIS — E1159 Type 2 diabetes mellitus with other circulatory complications: Secondary | ICD-10-CM | POA: Insufficient documentation

## 2016-04-05 DIAGNOSIS — F10939 Alcohol use, unspecified with withdrawal, unspecified: Secondary | ICD-10-CM | POA: Insufficient documentation

## 2016-04-05 LAB — GLUCOSE, POCT (MANUAL RESULT ENTRY)
POC Glucose: 297 mg/dl — AB (ref 70–99)
POC Glucose: 210 mg/dl — AB (ref 70–99)

## 2016-04-05 LAB — POCT GLYCOSYLATED HEMOGLOBIN (HGB A1C): Hemoglobin A1C: 13.3

## 2016-04-05 MED ORDER — METFORMIN HCL ER (MOD) 1000 MG PO TB24: 1000.0000 mg | ORAL_TABLET | Freq: Every day | ORAL | 3 refills | Status: DC

## 2016-04-05 MED ORDER — LISINOPRIL 10 MG PO TABS: 10.0000 mg | ORAL_TABLET | Freq: Every day | ORAL | 3 refills | Status: DC

## 2016-04-05 MED ORDER — INSULIN GLARGINE 100 UNIT/ML SOLOSTAR PEN: 20.0000 [IU] | PEN_INJECTOR | Freq: Every day | SUBCUTANEOUS | 3 refills | Status: DC

## 2016-04-05 MED ORDER — GABAPENTIN 300 MG PO CAPS: 300.0000 mg | ORAL_CAPSULE | Freq: Three times a day (TID) | ORAL | 3 refills | Status: DC

## 2016-04-05 MED ORDER — INSULIN ASPART 100 UNIT/ML ~~LOC~~ SOLN
10.0000 [IU] | Freq: Once | SUBCUTANEOUS | Status: AC
  Administered 2016-04-05: 10 [IU] via SUBCUTANEOUS

## 2016-04-05 MED FILL — !LANTUS SOLOSTAR 100UNITS/M: 100 | 30 days supply | Qty: 6 | Fill #0

## 2016-04-05 MED FILL — METFORMIN HCL ER 500 MG TAB: 500 | 30 days supply | Qty: 60 | Fill #0

## 2016-04-05 MED FILL — LISINOPRIL 10 MG TABLET: 10 | 30 days supply | Qty: 30 | Fill #0

## 2016-04-05 MED FILL — GABAPENTIN 300 MG CAPSULE: 300 | 30 days supply | Qty: 90 | Fill #0

## 2016-04-05 NOTE — Progress Notes (Signed)
Subjective:    Patient ID: David [name], male    DOB: 07/15/1980, 36 y.o.   MRN: [id number]  HPI Mr David [name] has a past medical history significant for diabetes, he is here today for a follow up of his diabetes.  He was recently in the emergency room for complaint of pain in bilateral feet, for which he was advised to continue taking his prescribed Gabapentin. He presents today with same complaint.  He reports not been complaint with his diabetic medications, he takes it when he remembers, but has been taking it everyday since the past one month. He report running out of all his medication 3 days ago. He endorsed having severe sharp pain and numbness in his feet. His A1C today is 13.3, and CBG 297. The last time he checked his blood glucose at home was 3 days ago and it was 300.  Patient endorsed drinking a fifth of liquor everyday.  Review of Systems  Constitutional: Positive for fatigue and unexpected weight change (lost about 60lbs without trying). Negative for chills and fever.  HENT: Negative.   Eyes: Negative for visual disturbance.  Respiratory: Negative for chest tightness and shortness of breath.   Cardiovascular: Negative for chest pain, palpitations and leg swelling.  Gastrointestinal: Negative for anal bleeding, blood in stool, constipation, diarrhea, nausea and vomiting.  Endocrine: Positive for polydipsia, polyphagia and polyuria.  Genitourinary: Negative for dysuria, flank pain and hematuria.  Musculoskeletal: Positive for gait problem (from pain in feet). Negative for joint swelling.  Skin: Negative.   Neurological: Positive for numbness (bilateral feet). Negative for seizures, weakness, light-headedness and headaches.  Hematological: Negative.   Psychiatric/Behavioral: Positive for sleep disturbance (insomnia due to pain in feet).   Past Medical History:  Diagnosis Date  . Diabetes mellitus ([place])   . Metacarpal bone fracture 07/11/2015   right small  . 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 blood glucose meter kit and supplies KIT Dispense based on patient and insurance preference. Use as directed. 1 each 0  . gabapentin (NEURONTIN) 300 MG capsule Take 1 capsule (300 mg total) by mouth 3 (three) times daily. 90 capsule 0  . glucose blood (ACCU-CHEK AVIVA) test strip Use as instructed 100 each 12  . Insulin Glargine (LANTUS SOLOSTAR) 100 UNIT/ML Solostar Pen Inject 20 Units into the skin daily at 10 pm. 30 mL 3  . Insulin Pen Needle 29G X 12.7MM MISC 10 Units by Does not apply route at bedtime. 100 each 11  . Lancets (FREESTYLE) lancets Use as instructed 100 each 12  . metFORMIN (GLUCOPHAGE) 500 MG tablet Take 1 tablet (500 mg total) by mouth 2 (two) times daily with a meal. 60 tablet 5  . naproxen (NAPROSYN) 500 MG tablet Take 1 tablet (500 mg total) by mouth 2 (two) times daily as needed for mild pain, moderate pain or headache (TAKE WITH MEALS.). 20 tablet 0   No current facility-administered medications on file prior to visit.    No Known Allergies  Past Surgical History:  Procedure Laterality Date  . NO PAST SURGERIES        Social History   Social History  . Marital status: Married    Spouse name: N/A  . Number of children: N/A  . Years of education: N/A   Occupational History  . Not on file.  Social History Main Topics  . Smoking status: Current Every Day Smoker    Packs/day: 0.50    Years: 10.00    Types: Cigarettes    Last attempt to quit: 10/27/2013  . Smokeless tobacco: Never Used  . Alcohol use Yes     Comment: one pint daily  . Drug use: No  . Sexual activity: Yes    Birth control/ protection: Condom   Other Topics Concern  . Not on file   Social History Narrative  . No narrative on file   Objective:  BP (!) 136/110 (BP Location: Right Leg, Cuff Size: Normal)   Pulse (!) 105    Temp 98.6 F (37 C) (Oral)   Resp 16   Wt 141 lb 9.6 oz (64.2 kg)   SpO2 97%   BMI 22.18 kg/m    HgbA1C today is 13.3 down from 14.9 on 06/15/14 CBG 297 (fasting)    Physical Exam  Constitutional: He is oriented to person, place, and time. He appears well-developed and well-nourished.  HENT:  Head: Normocephalic.  Right Ear: External ear normal.  Left Ear: External ear normal.  Mouth/Throat: Oropharynx is clear and moist.  Eyes: Conjunctivae are normal. Pupils are equal, round, and reactive to light.  Neck: Normal range of motion. Neck supple. No thyromegaly present.  Cardiovascular: Normal rate, regular rhythm, normal heart sounds and intact distal pulses.   No murmur heard. Pulmonary/Chest: Effort normal and breath sounds normal. No respiratory distress.  Abdominal: Soft. Bowel sounds are normal. He exhibits no distension. There is no tenderness.  Musculoskeletal: Normal range of motion. He exhibits tenderness (bilateral feet tender to touch). He exhibits no edema or deformity.  Lymphadenopathy:    He has no cervical adenopathy.  Neurological: He is alert and oriented to person, place, and time.  Skin: Skin is warm and dry.  Feet- no caluses, no sores or ulcers      Assessment & Plan:   1. Type 2 diabetes mellitus with complication, with long-term current use of insulin (HCC) Low carb diet discussed and encouraged - POCT A1C - Glucose (CBG) - Microalbumin/Creatinine Ratio, Urine - insulin aspart (novoLOG) injection 10 Units; Inject 0.1 mLs (10 Units total) into the skin once. - POCT glucose (manual entry) - gabapentin (NEURONTIN) 300 MG capsule; Take 1 capsule (300 mg total) by mouth 3 (three) times daily.  Dispense: 90 capsule; Refill: 3 - Insulin Glargine (LANTUS SOLOSTAR) 100 UNIT/ML Solostar Pen; Inject 20 Units into the skin daily at 10 pm.  Dispense: 30 mL; Refill: 3 - metFORMIN (GLUMETZA) 1000 MG (MOD) 24 hr tablet; Take 1 tablet (1,000 mg total) by mouth daily  with breakfast.  Dispense: 90 tablet; Refill: 3 - COMPLETE METABOLIC PANEL WITH GFR - Lipid panel  2. Alcohol abuse Stop drinking or cut back on alcohol intake Patient declined referral for detox. Education on effect of alcohol on diabetes control and body function discussion done.  3. Essential hypertension Low salt diet, do not add extra salt to meals - lisinopril (PRINIVIL,ZESTRIL) 10 MG tablet; Take 1 tablet (10 mg total) by mouth daily.  Dispense: 90 tablet; Refill: 3  Labs pending Health maintenance reviewed Diet and exercise encouraged Continue all meds  Follow up  in 1 month   Jari Favre, RN, BSN, AGNP-Student  Evaluation and management procedures were performed by me with DNP Student in attendance, note written by DNP student under my supervision and collaboration. I have reviewed the note and I agree with the management and  plan.  Angelica Chessman, MD, [place], [place], [place]n [place] [facility] and [place], [place]   04/14/2016, 6:32 PM

## 2016-04-05 NOTE — Progress Notes (Signed)
Pt is in the office today for diabetes Pt states he is having pain in his legs and feet Pt states at night he feels a sharp pain in both feet

## 2016-04-05 NOTE — Patient Instructions (Addendum)
Diabetes and Foot Care Diabetes may cause you to have problems because of poor blood supply (circulation) to your feet and legs. This may cause the skin on your feet to become thinner, break easier, and heal more slowly. Your skin may become dry, and the skin may peel and crack. You may also have nerve damage in your legs and feet causing decreased feeling in them. You may not notice minor injuries to your feet that could lead to infections or more serious problems. Taking care of your feet is one of the most important things you can do for yourself. Follow these instructions at home:  Wear shoes at all times, even in the house. Do not go barefoot. Bare feet are easily injured.  Check your feet daily for blisters, cuts, and redness. If you cannot see the bottom of your feet, use a mirror or ask someone for help.  Wash your feet with warm water (do not use hot water) and mild soap. Then pat your feet and the areas between your toes until they are completely dry. Do not soak your feet as this can dry your skin.  Apply a moisturizing lotion or petroleum jelly (that does not contain alcohol and is unscented) to the skin on your feet and to dry, brittle toenails. Do not apply lotion between your toes.  Trim your toenails straight across. Do not dig under them or around the cuticle. File the edges of your nails with an emery board or nail file.  Do not cut corns or calluses or try to remove them with medicine.  Wear clean socks or stockings every day. Make sure they are not too tight. Do not wear knee-high stockings since they may decrease blood flow to your legs.  Wear shoes that fit properly and have enough cushioning. To break in new shoes, wear them for just a few hours a day. This prevents you from injuring your feet. Always look in your shoes before you put them on to be sure there are no objects inside.  Do not cross your legs. This may decrease the blood flow to your feet.  If you find a  minor scrape, cut, or break in the skin on your feet, keep it and the skin around it clean and dry. These areas may be cleansed with mild soap and water. Do not cleanse the area with peroxide, alcohol, or iodine.  When you remove an adhesive bandage, be sure not to damage the skin around it.  If you have a wound, look at it several times a day to make sure it is healing.  Do not use heating pads or hot water bottles. They may burn your skin. If you have lost feeling in your feet or legs, you may not know it is happening until it is too late.  Make sure your health care provider performs a complete foot exam at least annually or more often if you have foot problems. Report any cuts, sores, or bruises to your health care provider immediately. Contact a health care provider if:  You have an injury that is not healing.  You have cuts or breaks in the skin.  You have an ingrown nail.  You notice redness on your legs or feet.  You feel burning or tingling in your legs or feet.  You have pain or cramps in your legs and feet.  Your legs or feet are numb.  Your feet always feel cold. Get help right away if:  There is increasing   redness, swelling, or pain in or around a wound.  There is a red line that goes up your leg.  Pus is coming from a wound.  You develop a fever or as directed by your health care provider.  You notice a bad smell coming from an ulcer or wound. This information is not intended to replace advice given to you by your health care provider. Make sure you discuss any questions you have with your health care provider. Document Released: 02/18/2000 Document Revised: 07/29/2015 Document Reviewed: 07/30/2012 Elsevier Interactive Patient Education  2017 Elsevier Inc. Blood Glucose Monitoring, Adult Monitoring your blood sugar (glucose) helps you manage your diabetes. It also helps you and your health care provider determine how well your diabetes management plan is  working. Blood glucose monitoring involves checking your blood glucose as often as directed, and keeping a record (log) of your results over time. Why should I monitor my blood glucose? Checking your blood glucose regularly can:  Help you understand how food, exercise, illnesses, and medicines affect your blood glucose.  Let you know what your blood glucose is at any time. You can quickly tell if you are having low blood glucose (hypoglycemia) or high blood glucose (hyperglycemia).  Help you and your health care provider adjust your medicines as needed. When should I check my blood glucose? Follow instructions from your health care provider about how often to check your blood glucose. This may depend on:  The type of diabetes you have.  How well-controlled your diabetes is.  Medicines you are taking. If you have type 1 diabetes:  Check your blood glucose at least 2 times a day.  Also check your blood glucose:  Before every insulin injection.  Before and after exercise.  Between meals.  2 hours after a meal.  Occasionally between 2:00 a.m. and 3:00 a.m., as directed.  Before potentially dangerous tasks, like driving or using heavy machinery.  At bedtime.  You may need to check your blood glucose more often, up to 6-10 times a day:  If you use an insulin pump.  If you need multiple daily injections (MDI).  If your diabetes is not well-controlled.  If you are ill.  If you have a history of severe hypoglycemia.  If you have a history of not knowing when your blood glucose is getting low (hypoglycemia unawareness). If you have type 2 diabetes:  If you take insulin or other diabetes medicines, check your blood glucose at least 2 times a day.  If you are on intensive insulin therapy, check your blood glucose at least 4 times a day. Occasionally, you may also need to check between 2:00 a.m. and 3:00 a.m., as directed.  Also check your blood glucose:  Before and after  exercise.  Before potentially dangerous tasks, like driving or using heavy machinery.  You may need to check your blood glucose more often if:  Your medicine is being adjusted.  Your diabetes is not well-controlled.  You are ill. What is a blood glucose log?  A blood glucose log is a record of your blood glucose readings. It helps you and your health care provider:  Look for patterns in your blood glucose over time.  Adjust your diabetes management plan as needed.  Every time you check your blood glucose, write down your result and notes about things that may be affecting your blood glucose, such as your diet and exercise for the day.  Most glucose meters store a record of glucose readings in   the meter. Some meters allow you to download your records to a computer. How do I check my blood glucose? Follow these steps to get accurate readings of your blood glucose: Supplies needed   Blood glucose meter.  Test strips for your meter. Each meter has its own strips. You must use the strips that come with your meter.  A needle to prick your finger (lancet). Do not use lancets more than once.  A device that holds the lancet (lancing device).  A journal or log book to write down your results. Procedure  Wash your hands with soap and water.  Prick the side of your finger (not the tip) with the lancet. Use a different finger each time.  Gently rub the finger until a small drop of blood appears.  Follow instructions that come with your meter for inserting the test strip, applying blood to the strip, and using your blood glucose meter.  Write down your result and any notes. Alternative testing sites  Some meters allow you to use areas of your body other than your finger (alternative sites) to test your blood.  If you think you may have hypoglycemia, or if you have hypoglycemia unawareness, do not use alternative sites. Use your finger instead.  Alternative sites may not be as  accurate as the fingers, because blood flow is slower in these areas. This means that the result you get may be delayed, and it may be different from the result that you would get from your finger.  The most common alternative sites are:  Forearm.  Thigh.  Palm of the hand. Additional tips  Always keep your supplies with you.  If you have questions or need help, all blood glucose meters have a 24-hour "hotline" number that you can call. You may also contact your health care provider.  After you use a few boxes of test strips, adjust (calibrate) your blood glucose meter by following instructions that came with your meter. This information is not intended to replace advice given to you by your health care provider. Make sure you discuss any questions you have with your health care provider. Document Released: 02/23/2003 Document Revised: 09/10/2015 Document Reviewed: 08/02/2015 Elsevier Interactive Patient Education  2017 Elsevier Inc.  

## 2016-04-06 LAB — COMPLETE METABOLIC PANEL WITH GFR
ALBUMIN: 4.2 g/dL (ref 3.6–5.1)
ALK PHOS: 53 U/L (ref 40–115)
ALT: 27 U/L (ref 9–46)
AST: 32 U/L (ref 10–40)
BUN: 10 mg/dL (ref 7–25)
CALCIUM: 9.7 mg/dL (ref 8.6–10.3)
CHLORIDE: 102 mmol/L (ref 98–110)
CO2: 31 mmol/L (ref 20–31)
Creat: 0.81 mg/dL (ref 0.60–1.35)
Glucose, Bld: 102 mg/dL — ABNORMAL HIGH (ref 65–99)
Potassium: 3.9 mmol/L (ref 3.5–5.3)
Sodium: 140 mmol/L (ref 135–146)
Total Bilirubin: 0.7 mg/dL (ref 0.2–1.2)
Total Protein: 7.3 g/dL (ref 6.1–8.1)

## 2016-04-06 LAB — MICROALBUMIN / CREATININE URINE RATIO
Creatinine, Urine: 286 mg/dL (ref 20–370)
MICROALB UR: 1 mg/dL
Microalb Creat Ratio: 3 mcg/mg creat (ref <30)

## 2016-04-06 LAB — LIPID PANEL
CHOL/HDL RATIO: 1.7 ratio (ref <5.0)
CHOLESTEROL: 162 mg/dL (ref <200)
HDL: 93 mg/dL (ref >40)
LDL Cholesterol: 52 mg/dL (ref <100)
TRIGLYCERIDES: 87 mg/dL (ref <150)
VLDL: 17 mg/dL (ref <30)

## 2016-04-20 ENCOUNTER — Telehealth: Payer: Self-pay | Admitting: Internal Medicine

## 2016-04-20 NOTE — Telephone Encounter (Signed)
Pt. Called stating that he was prescribed lisinopril (PRINIVIL,ZESTRIL) 10 MG tablet And he had a seizure. Pt. States that he suffers from seizures but has not had one in a while. Please f/u with pt.

## 2016-04-20 NOTE — Telephone Encounter (Signed)
Please schedule patient for an appointment to discuss this concern. Thanks

## 2016-04-20 NOTE — Telephone Encounter (Signed)
Called pt. To schedule an app. And pt. Did not answer and VM was full.

## 2016-04-24 NOTE — Telephone Encounter (Signed)
Patient verified DOB Patient is aware  Of labs falling within normal limits. Patient had concern with side effects that OTHERS are experiencing with Lisinopril. Patient states he has not experienced any adverse symptoms. Patient expressed his understanding and had no further questions at this time.

## 2016-04-24 NOTE — Telephone Encounter (Signed)
-----   Message from Quentin Angst, MD sent at 04/14/2016 10:19 AM EST ----- Please inform patient that his lab results are within normal limits

## 2016-04-25 NOTE — Telephone Encounter (Signed)
It is safe and beneficial to take lisinopril even with other DM medications

## 2016-05-07 ENCOUNTER — Ambulatory Visit (HOSPITAL_COMMUNITY)
Admission: EM | Admit: 2016-05-07 | Discharge: 2016-05-07 | Disposition: A | Discharge status: Other Acute Inpatient Hospital | Payer: Self-pay | Attending: Family Medicine | Admitting: Family Medicine

## 2016-05-07 ENCOUNTER — Encounter (HOSPITAL_COMMUNITY): Payer: Self-pay | Admitting: Emergency Medicine

## 2016-05-07 DIAGNOSIS — L03011 Cellulitis of right finger: Secondary | ICD-10-CM

## 2016-05-07 NOTE — Discharge Instructions (Signed)
Go to the emergency department now for management of the infection of your finger.

## 2016-05-07 NOTE — ED Provider Notes (Signed)
CSN: [id number]     Arrival date & time 05/07/16  1508 History   First MD Initiated Contact with Patient 05/07/16 1710     Chief Complaint  Patient presents with  . Hand Pain   (Consider location/radiation/quality/duration/timing/severity/associated sxs/prior Treatment) 36 year old male presents to the urgent care complaining of primarily distal right middle finger pain and swelling started approximately one week ago. Denies any known trauma. Denies any known infection of the nail. He is complaining of pain and tenderness within the pulp.      Past Medical History:  Diagnosis Date  . Diabetes mellitus ([place])   . Metacarpal bone fracture 07/11/2015   right small  . Seizure with provoking factor [facility])    states seizure was due to alcohol    Past Surgical History:  Procedure Laterality Date  . NO PAST SURGERIES     Family History  Problem Relation Age of Onset  . Diabetes Father    Social History  Substance Use Topics  . Smoking status: Current Every Day Smoker    Packs/day: 0.50    Years: 10.00    Types: Cigarettes    Last attempt to quit: 10/27/2013  . Smokeless tobacco: Never Used  . Alcohol use Yes     Comment: one pint daily    Review of Systems  Constitutional: Negative.   Gastrointestinal: Negative.   Musculoskeletal:       As per history of present illness.  Neurological: Negative.   All other systems reviewed and are negative.   Allergies  Patient has no known allergies.  Home Medications   Prior to Admission medications   Medication Sig Start Date End Date Taking? Authorizing Provider  blood glucose meter kit and supplies KIT Dispense based on patient and insurance preference. Use as directed. 07/28/15   Rowe Clack, MD  gabapentin (NEURONTIN) 300 MG capsule Take 1 capsule (300 mg total) by mouth 3 (three) times daily. 04/05/16   Tresa Garter, MD  glucose blood (ACCU-CHEK AVIVA) test strip Use as instructed 07/28/15   Rowe Clack, MD   Insulin Glargine (LANTUS SOLOSTAR) 100 UNIT/ML Solostar Pen Inject 20 Units into the skin daily at 10 pm. 04/05/16   Tresa Garter, MD  Insulin Pen Needle 29G X 12.7MM MISC 10 Units by Does not apply route at bedtime. 06/15/14   Tresa Garter, MD  Lancets (FREESTYLE) lancets Use as instructed 07/28/15   Rowe Clack, MD  lisinopril (PRINIVIL,ZESTRIL) 10 MG tablet Take 1 tablet (10 mg total) by mouth daily. 04/05/16   Tresa Garter, MD  metFORMIN (GLUMETZA) 1000 MG (MOD) 24 hr tablet Take 1 tablet (1,000 mg total) by mouth daily with breakfast. 04/05/16   Tresa Garter, MD  naproxen (NAPROSYN) 500 MG tablet Take 1 tablet (500 mg total) by mouth 2 (two) times daily as needed for mild pain, moderate pain or headache (TAKE WITH MEALS.). 07/21/15   Mercedes Street, PA-C   Meds Ordered and Administered this Visit  Medications - No data to display  BP (!) 139/110 (BP Location: Left Arm)   Pulse 102   Temp 99 F (37.2 C) (Oral)   Resp 16   SpO2 99%  No data found.   Physical Exam  Constitutional: He is oriented to person, place, and time. He appears well-developed and well-nourished.  Neck: Normal range of motion. Neck supple.  Pulmonary/Chest: Effort normal.  Musculoskeletal:  There is swelling, tenderness and erythema to the pulp of the right middle  finger. The primary swelling and tenderness extend to the flexor crease. There is minor swelling along the flexor surface of the finger. However tenderness is limited to the distal phalanx. No evidence of paronychia. Able to flex approximately 30-40 of the DIP and has full flexion of the PIP. No tenderness or pain to the extensor surface of the finger.  Neurological: He is alert and oriented to person, place, and time.  Skin: Skin is warm and dry.  Psychiatric: He has a normal mood and affect.  Nursing note and vitals reviewed.   Urgent Care Course     Procedures (including critical care time)  Labs Review Labs  Reviewed - No data to display  Imaging Review No results found.   Visual Acuity Review  Right Eye Distance:   Left Eye Distance:   Bilateral Distance:    Right Eye Near:   Left Eye Near:    Bilateral Near:         MDM   1. Felon of finger of right hand    Dr. Grandville Silos page [phone].Consulted with Dr. Grandville Silos approximately 15 minutes later. Recommended that the felon be opened and drained. If we do not feel comfortable in the urgent care doing that to send him to the emergency department. I spoke with the patient and advised that this would best be done of those would have done this several times and would need to go to the emergency department and he is in agreement.    Janne Napoleon, NP 05/07/16 1756

## 2016-05-07 NOTE — ED Triage Notes (Signed)
The patient presented to [facility] with a complaint of right hand middle finger pain and swelling x 1 week. The patient denied any known injury but did report that he is a diabetic.

## 2016-05-08 ENCOUNTER — Emergency Department (HOSPITAL_COMMUNITY)
Admission: EM | Admit: 2016-05-08 | Discharge: 2016-05-08 | Disposition: A | Discharge status: Home / Self Care | Payer: Self-pay | Attending: Emergency Medicine | Admitting: Emergency Medicine

## 2016-05-08 ENCOUNTER — Encounter (HOSPITAL_COMMUNITY): Payer: Self-pay | Admitting: Emergency Medicine

## 2016-05-08 DIAGNOSIS — Z23 Encounter for immunization: Secondary | ICD-10-CM | POA: Insufficient documentation

## 2016-05-08 DIAGNOSIS — I10 Essential (primary) hypertension: Secondary | ICD-10-CM | POA: Insufficient documentation

## 2016-05-08 DIAGNOSIS — Z794 Long term (current) use of insulin: Secondary | ICD-10-CM | POA: Insufficient documentation

## 2016-05-08 DIAGNOSIS — L03019 Cellulitis of unspecified finger: Secondary | ICD-10-CM

## 2016-05-08 DIAGNOSIS — L03011 Cellulitis of right finger: Secondary | ICD-10-CM | POA: Insufficient documentation

## 2016-05-08 DIAGNOSIS — F17210 Nicotine dependence, cigarettes, uncomplicated: Secondary | ICD-10-CM | POA: Insufficient documentation

## 2016-05-08 DIAGNOSIS — E119 Type 2 diabetes mellitus without complications: Secondary | ICD-10-CM | POA: Insufficient documentation

## 2016-05-08 LAB — CBG MONITORING, ED: GLUCOSE-CAPILLARY: 241 mg/dL — AB (ref 65–99)

## 2016-05-08 MED ORDER — LIDOCAINE HCL (PF) 1 % IJ SOLN
5.0000 mL | Freq: Once | INTRAMUSCULAR | Status: DC
  Filled 2016-05-08: qty 5

## 2016-05-08 MED ORDER — HYDROCODONE-ACETAMINOPHEN 5-325 MG PO TABS: 1.0000 | ORAL_TABLET | ORAL | 0 refills | Status: DC | PRN

## 2016-05-08 MED ORDER — HYDROCODONE-ACETAMINOPHEN 5-325 MG PO TABS
1.0000 | ORAL_TABLET | Freq: Once | ORAL | Status: AC
Start: 2016-05-08 — End: 2016-05-08
  Administered 2016-05-08: 1 via ORAL
  Filled 2016-05-08: qty 1

## 2016-05-08 MED ORDER — SULFAMETHOXAZOLE-TRIMETHOPRIM 800-160 MG PO TABS: 1.0000 | ORAL_TABLET | Freq: Two times a day (BID) | ORAL | 0 refills | Status: AC

## 2016-05-08 MED ORDER — TETANUS-DIPHTH-ACELL PERTUSSIS 5-2.5-18.5 LF-MCG/0.5 IM SUSP
0.5000 mL | Freq: Once | INTRAMUSCULAR | Status: AC
  Administered 2016-05-08: 0.5 mL via INTRAMUSCULAR
  Filled 2016-05-08: qty 0.5

## 2016-05-08 NOTE — ED Provider Notes (Signed)
David [name] Provider Note   CSN: [id number] Arrival date & time: 05/08/16  1030     History   Chief Complaint Chief Complaint  Patient presents with  . Finger Injury    HPI David [name] is a 36 y.o. male.  HPI   Pt with hx diabetes, nail biting, smoking p/w middle finger, distal tip with swelling and redness, mild tenderness x 1 week.  He denies any injury to the finger.  Has had tingling in his 1st and 2nd fingertips but no altered sensation or weakness in the affected digit.  Has felt hot and cold.  Pt is left handed.  He is not out of his medication but rarely checks his blood sugar, does not take diabetes medication daily.    Past Medical History:  Diagnosis Date  . Diabetes mellitus ([place])   . Metacarpal bone fracture 07/11/2015   right small  . 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 Alcohol abuse 04/05/2016  . Hypertension 04/05/2016  . Type 2 diabetes mellitus without complication ( [place]) 34/28/7681    Past Surgical History:  Procedure Laterality Date  . NO PAST SURGERIES         Home Medications    Prior to Admission medications   Medication Sig Start Date End Date Taking? Authorizing Provider  blood glucose meter kit and supplies KIT Dispense based on patient and insurance preference. Use as directed. 07/28/15   Rowe Clack, MD  gabapentin (NEURONTIN) 300 MG capsule Take 1 capsule (300 mg total) by mouth 3 (three) times daily. 04/05/16   Tresa Garter, MD  glucose blood (ACCU-CHEK AVIVA) test strip Use as instructed 07/28/15   Rowe Clack, MD  HYDROcodone-acetaminophen (NORCO/VICODIN) 5-325 MG tablet Take 1 tablet by mouth every 4 (four) hours as needed for moderate pain or severe pain. 05/08/16   Clayton Bibles, PA-C  Insulin Glargine (LANTUS SOLOSTAR) 100 UNIT/ML Solostar Pen Inject 20 Units into the skin daily at 10 pm. 04/05/16   Tresa Garter, MD  Insulin Pen Needle 29G X 12.7MM MISC 10 Units by Does not apply route at bedtime. 06/15/14   Tresa Garter, MD  Lancets (FREESTYLE) lancets Use as instructed 07/28/15   Rowe Clack, MD  lisinopril (PRINIVIL,ZESTRIL) 10 MG tablet Take 1 tablet (10 mg total) by mouth daily. 04/05/16   Tresa Garter, MD  metFORMIN (GLUMETZA) 1000 MG (MOD) 24 hr tablet Take 1 tablet (1,000 mg total) by mouth daily with breakfast. 04/05/16   Tresa Garter, MD  naproxen (NAPROSYN) 500 MG tablet Take 1 tablet (500 mg total) by mouth 2 (two) times daily as needed for mild pain, moderate pain or headache (TAKE WITH MEALS.). 07/21/15   Mercedes Street, PA-C  sulfamethoxazole-trimethoprim (BACTRIM DS,SEPTRA DS) 800-160 MG tablet Take 1 tablet by mouth 2 (two) times daily. 05/08/16 05/18/16  Clayton Bibles, PA-C    Family History Family History  Problem Relation Age of Onset  . Diabetes Father     Social History Social History  Substance Use Topics  . Smoking status: Current Every Day Smoker    Packs/day: 0.50    Years: 10.00    Types: Cigarettes    Last attempt to quit: 10/27/2013  . Smokeless tobacco: Never Used  . Alcohol use Yes     Comment: one pint daily     Allergies  Patient has no known allergies.   Review of Systems Review of Systems  All other systems reviewed and are negative.    Physical Exam Updated Vital Signs BP (!) 150/125 (BP Location: Left Arm)   Pulse 105   Temp 98.7 F (37.1 C) (Oral)   Resp 16   SpO2 99%   Physical Exam  Constitutional: He appears well-developed and well-nourished. No distress.  HENT:  Head: Normocephalic and atraumatic.  Neck: Neck supple.  Cardiovascular: Normal rate and regular rhythm.   Pulmonary/Chest: Effort normal and breath sounds normal.  Musculoskeletal:  Right distal 3rd finger with edema, erythema, mild tenderness.  No induration or fluctuance.  No e/o paronychia.  Nails are extremely short.  Full AROM of finger  with exception of slight decrease in flexion due to edema.  Distal sensation intact, capillary refill < 3 seconds.    Neurological: He is alert.  Skin: He is not diaphoretic.  Nursing note and vitals reviewed.    ED Treatments / Results  Labs (all labs ordered are listed, but only abnormal results are displayed) Labs Reviewed  CBG MONITORING, ED - Abnormal; Notable for the following:       Result Value   Glucose-Capillary 241 (*)    All other components within normal limits    EKG  EKG Interpretation None       Radiology No results found.  Procedures Procedures (including critical care time)  INCISION AND DRAINAGE Performed by: Clayton Bibles Consent: Verbal consent obtained. Risks and benefits: risks, benefits and alternatives were discussed Type: abscess  Body area: right middle finger  Anesthesia: Digital block Performed by [place] Russo, PA-C (please see her separate note for details)  Incision was made with a scalpel.  Drainage: purulent  Drainage amount: small to moderate  Packing material: none  Irrigated with normal saline  Patient tolerance: Patient tolerated the procedure well with no immediate complications.     Medications Ordered in ED Medications  lidocaine (PF) (XYLOCAINE) 1 % injection 5 mL (not administered)  Tdap (BOOSTRIX) injection 0.5 mL (0.5 mLs Intramuscular Given 05/08/16 1355)  HYDROcodone-acetaminophen (NORCO/VICODIN) 5-325 MG per tablet 1 tablet (1 tablet Oral Given 05/08/16 1356)     Initial Impression / Assessment and Plan / ED Course  I have reviewed the triage vital signs and the nursing notes.  Pertinent labs & imaging results that were available during my care of the patient were reviewed by me and considered in my medical decision making (see chart for details).    Afebrile nontoxic left handed patient with early felon of right 3rd finger, sent from urgent care.   Pt also seen and examined by Dr Tomi Bamberger, discussed plan.   I&D with purulent drainage.  Pt strongly encouraged to monitor his blood sugar closely, take the antibiotics until they are gone.  Discussed home care.  Hand surgery referral given.  D/C home with bactrim, norco.  Discussed result, findings, treatment, and follow up  with patient.  Pt given return precautions.  Pt verbalizes understanding and agrees with plan.      Final Clinical Impressions(s) / ED Diagnoses   Final diagnoses:  Felon of finger    New Prescriptions Discharge Medication List as of 05/08/2016  1:55 PM    START taking these medications   Details  HYDROcodone-acetaminophen (NORCO/VICODIN) 5-325 MG tablet Take 1 tablet by mouth every 4 (four) hours as needed for moderate pain or severe pain., Starting Mon 05/08/2016, Print    sulfamethoxazole-trimethoprim (BACTRIM DS,SEPTRA DS) 800-160  MG tablet Take 1 tablet by mouth 2 (two) times daily., Starting Mon 05/08/2016, Until Thu 05/18/2016, Print         O[place], PA-C 05/08/16 [place], MD 05/11/16 [phone]

## 2016-05-08 NOTE — ED Triage Notes (Signed)
Pt with right middle finger swelling around nail bed x 3 days

## 2016-05-08 NOTE — ED Notes (Signed)
ED Provider at bedside. 

## 2016-05-08 NOTE — ED Provider Notes (Signed)
Procedure Note  David [name]  .Nerve Block Date/Time: 05/08/2016 2:10 PM Performed by: RUSSO, [place] NICOLE Authorized by: RUSSO, [place] NICOLE   Consent:    Consent obtained:  Verbal   Consent given by:  Patient   Risks discussed:  Pain   Alternatives discussed:  No treatment Indications:    Indications:  Procedural anesthesia Location:    Body area:  Upper extremity (Right 3rd digit)   Laterality:  Right Pre-procedure details:    Skin preparation:  Povidone-iodine Skin anesthesia (see MAR for exact dosages):    Skin anesthesia method:  Local infiltration   Local anesthetic:  Lidocaine 1% w/o epi Procedure details (see MAR for exact dosages):    Block needle gauge:  27 G   Anesthetic injected:  Lidocaine 1% w/o epi   Steroid injected:  None   Additive injected:  None   Injection procedure:  Anatomic landmarks palpated Post-procedure details:    Dressing:  None   Outcome:  Anesthesia achieved   Patient tolerance of procedure:  Tolerated well, no immediate complications Comments:     Pt was evaluated and treated by [name], PA-C.        [place] Nicole Russo, PA-C 05/08/16 1419    Linwood Dibbles, MD 05/11/16 [phone]

## 2016-05-08 NOTE — Discharge Instructions (Addendum)
Read the information below.  Use the prescribed medication as directed.  Please discuss all new medications with your pharmacist.  Do not take additional tylenol while taking the prescribed pain medication to avoid overdose.  You may return to the Emergency Department at any time for worsening condition or any new symptoms that concern you.    If you develop redness, swelling, uncontrolled pain, difficulty moving your finger, or fevers greater than 100.4, return to the ER immediately for a recheck.    Please monitor your blood sugars closely and follow a diabetic diet.

## 2016-11-30 ENCOUNTER — Encounter (HOSPITAL_COMMUNITY): Payer: Self-pay

## 2016-11-30 DIAGNOSIS — F17210 Nicotine dependence, cigarettes, uncomplicated: Secondary | ICD-10-CM | POA: Insufficient documentation

## 2016-11-30 DIAGNOSIS — R197 Diarrhea, unspecified: Secondary | ICD-10-CM | POA: Insufficient documentation

## 2016-11-30 DIAGNOSIS — Z794 Long term (current) use of insulin: Secondary | ICD-10-CM | POA: Insufficient documentation

## 2016-11-30 DIAGNOSIS — R112 Nausea with vomiting, unspecified: Secondary | ICD-10-CM | POA: Insufficient documentation

## 2016-11-30 DIAGNOSIS — E119 Type 2 diabetes mellitus without complications: Secondary | ICD-10-CM | POA: Insufficient documentation

## 2016-11-30 DIAGNOSIS — Z79899 Other long term (current) drug therapy: Secondary | ICD-10-CM | POA: Insufficient documentation

## 2016-11-30 LAB — LIPASE, BLOOD: LIPASE: 31 U/L (ref 11–51)

## 2016-11-30 LAB — CBC
HCT: 39.5 % (ref 39.0–52.0)
Hemoglobin: 13.1 g/dL (ref 13.0–17.0)
MCH: 27.6 pg (ref 26.0–34.0)
MCHC: 33.2 g/dL (ref 30.0–36.0)
MCV: 83.3 fL (ref 78.0–100.0)
PLATELETS: 303 10*3/uL (ref 150–400)
RBC: 4.74 MIL/uL (ref 4.22–5.81)
RDW: 13.6 % (ref 11.5–15.5)
WBC: 7.9 10*3/uL (ref 4.0–10.5)

## 2016-11-30 LAB — CBG MONITORING, ED: Glucose-Capillary: 125 mg/dL — ABNORMAL HIGH (ref 65–99)

## 2016-11-30 LAB — URINALYSIS, ROUTINE W REFLEX MICROSCOPIC
BILIRUBIN URINE: NEGATIVE
Glucose, UA: NEGATIVE mg/dL
HGB URINE DIPSTICK: NEGATIVE
Ketones, ur: NEGATIVE mg/dL
LEUKOCYTES UA: NEGATIVE
Nitrite: NEGATIVE
PH: 6 (ref 5.0–8.0)
Protein, ur: 30 mg/dL — AB
SPECIFIC GRAVITY, URINE: 1.03 (ref 1.005–1.030)
Squamous Epithelial / LPF: NONE SEEN

## 2016-11-30 LAB — COMPREHENSIVE METABOLIC PANEL
ALK PHOS: 39 U/L (ref 38–126)
ALT: 10 U/L — AB (ref 17–63)
AST: 21 U/L (ref 15–41)
Albumin: 4.4 g/dL (ref 3.5–5.0)
Anion gap: 9 (ref 5–15)
BILIRUBIN TOTAL: 0.9 mg/dL (ref 0.3–1.2)
BUN: 6 mg/dL (ref 6–20)
CALCIUM: 9.6 mg/dL (ref 8.9–10.3)
CO2: 26 mmol/L (ref 22–32)
CREATININE: 0.67 mg/dL (ref 0.61–1.24)
Chloride: 103 mmol/L (ref 101–111)
Glucose, Bld: 142 mg/dL — ABNORMAL HIGH (ref 65–99)
Potassium: 3.5 mmol/L (ref 3.5–5.1)
Sodium: 138 mmol/L (ref 135–145)
TOTAL PROTEIN: 7.7 g/dL (ref 6.5–8.1)

## 2016-11-30 NOTE — ED Triage Notes (Signed)
Pt states that he has been taking percocet's for pain for the past several months, states today they made him feel sick and vomiting. Pt is putting his fingers down his throat and forcing himself to throw up during triage, advised pt not to do this. Denies pain, states he now feels weak from throwing up.

## 2016-12-01 ENCOUNTER — Emergency Department (HOSPITAL_COMMUNITY)
Admission: EM | Admit: 2016-12-01 | Discharge: 2016-12-01 | Disposition: A | Discharge status: Home / Self Care | Payer: Self-pay | Attending: Emergency Medicine | Admitting: Emergency Medicine

## 2016-12-01 DIAGNOSIS — R197 Diarrhea, unspecified: Secondary | ICD-10-CM

## 2016-12-01 DIAGNOSIS — R112 Nausea with vomiting, unspecified: Secondary | ICD-10-CM

## 2016-12-01 MED ORDER — SODIUM CHLORIDE 0.9 % IV BOLUS (SEPSIS)
1000.0000 mL | Freq: Once | INTRAVENOUS | Status: AC
  Administered 2016-12-01: 1000 mL via INTRAVENOUS

## 2016-12-01 MED ORDER — PROMETHAZINE HCL 25 MG PO TABS: 25.0000 mg | ORAL_TABLET | Freq: Four times a day (QID) | ORAL | 0 refills | Status: DC | PRN

## 2016-12-01 MED ORDER — KETOROLAC TROMETHAMINE 30 MG/ML IJ SOLN
30.0000 mg | Freq: Once | INTRAMUSCULAR | Status: AC
  Administered 2016-12-01: 30 mg via INTRAVENOUS
  Filled 2016-12-01: qty 1

## 2016-12-01 MED ORDER — ONDANSETRON HCL 4 MG/2ML IJ SOLN
4.0000 mg | Freq: Once | INTRAMUSCULAR | Status: AC
  Administered 2016-12-01: 4 mg via INTRAVENOUS
  Filled 2016-12-01: qty 2

## 2016-12-01 NOTE — ED Provider Notes (Signed)
TIME SEEN: 12:43 AM  CHIEF COMPLAINT: Nausea, vomiting, diarrhea  HPI: Patient is a 36 year old male with history of diabetes who presents emergency department with multiple episodes of vomiting today and one episode of nonbloody diarrhea. No fevers or chills. No abdominal pain. States he is buying Percocet off the street for his diabetic nerve pain. States he took Percocet this morning and then vomited. His significant other reports that she does not think the Percocet had any time to work because he immediately vomited. He has taken Percocet before without vomiting. No sick contacts or recent travel. No recent hospitalization. No previous abdominal surgery.  ROS: See HPI Constitutional: no fever  Eyes: no drainage  ENT: no runny nose   Cardiovascular:  no chest pain  Resp: no SOB  GI: Nausea, vomiting and diarrhea GU: no dysuria Integumentary: no rash  Allergy: no hives  Musculoskeletal: no leg swelling  Neurological: no slurred speech ROS otherwise negative  PAST MEDICAL HISTORY/PAST SURGICAL HISTORY:  Past Medical History:  Diagnosis Date  . Diabetes mellitus ([place])   . Metacarpal bone fracture 07/11/2015   right small  . Seizure with provoking factor ([place])    states seizure was due to alcohol     MEDICATIONS:  Prior to Admission medications   Medication Sig Start Date End Date Taking? Authorizing Provider  blood glucose meter kit and supplies KIT Dispense based on patient and insurance preference. Use as directed. 07/28/15   Rowe Clack, MD  gabapentin (NEURONTIN) 300 MG capsule Take 1 capsule (300 mg total) by mouth 3 (three) times daily. 04/05/16   Tresa Garter, MD  glucose blood (ACCU-CHEK AVIVA) test strip Use as instructed 07/28/15   Rowe Clack, MD  HYDROcodone-acetaminophen (NORCO/VICODIN) 5-325 MG tablet Take 1 tablet by mouth every 4 (four) hours as needed for moderate pain or severe pain. 05/08/16   Clayton Bibles, PA-C  Insulin Glargine (LANTUS  SOLOSTAR) 100 UNIT/ML Solostar Pen Inject 20 Units into the skin daily at 10 pm. 04/05/16   Tresa Garter, MD  Insulin Pen Needle 29G X 12.7MM MISC 10 Units by Does not apply route at bedtime. 06/15/14   Tresa Garter, MD  Lancets (FREESTYLE) lancets Use as instructed 07/28/15   Rowe Clack, MD  lisinopril (PRINIVIL,ZESTRIL) 10 MG tablet Take 1 tablet (10 mg total) by mouth daily. 04/05/16   Tresa Garter, MD  metFORMIN (GLUMETZA) 1000 MG (MOD) 24 hr tablet Take 1 tablet (1,000 mg total) by mouth daily with breakfast. 04/05/16   Tresa Garter, MD  naproxen (NAPROSYN) 500 MG tablet Take 1 tablet (500 mg total) by mouth 2 (two) times daily as needed for mild pain, moderate pain or headache (TAKE WITH MEALS.). 07/21/15   Street, Alice, PA-C    ALLERGIES:  No Known Allergies  SOCIAL HISTORY:  Social History  Substance Use Topics  . Smoking status: Current Every Day Smoker    Packs/day: 0.50    Years: 10.00    Types: Cigarettes    Last attempt to quit: 10/27/2013  . Smokeless tobacco: Never Used  . Alcohol use Yes     Comment: one pint daily    FAMILY HISTORY: Family History  Problem Relation Age of Onset  . Diabetes Father     EXAM: BP (!) 154/85 (BP Location: Left Arm)   Pulse 84   Temp 98.8 F (37.1 C) (Oral)   Resp 16   SpO2 100%  CONSTITUTIONAL: Alert and oriented and responds appropriately to  questions. Chronically ill-appearing; afebrile and nontoxic, does appear uncomfortable HEAD: Normocephalic EYES: Conjunctivae clear, pupils appear equal, EOMI ENT: normal nose; moist mucous membranes NECK: Supple, no meningismus, no nuchal rigidity, no LAD  CARD: RRR; S1 and S2 appreciated; no murmurs, no clicks, no rubs, no gallops RESP: Normal chest excursion without splinting or tachypnea; breath sounds clear and equal bilaterally; no wheezes, no rhonchi, no rales, no hypoxia or respiratory distress, speaking full sentences ABD/GI: Normal bowel  sounds; non-distended; soft, non-tender, no rebound, no guarding, no peritoneal signs, no hepatosplenomegaly BACK:  The back appears normal and is non-tender to palpation, there is no CVA tenderness EXT: Normal ROM in all joints; non-tender to palpation; no edema; normal capillary refill; no cyanosis, no calf tenderness or swelling    SKIN: Normal color for age and race; warm; no rash NEURO: Moves all extremities equally PSYCH: The patient's mood and manner are appropriate. Grooming and personal hygiene are appropriate.  MEDICAL DECISION MAKING: Patient here with nausea, vomiting and diarrhea. Could be viral gastroenteritis. Abdominal exam completely benign. Doubt appendicitis, pancreatitis, cholecystitis, colitis, diverticulitis, bowel structure. Labs obtained in triage are unremarkable. I do not feel he needs emergent imaging of his abdomen.  He does not appear to be in DKA. Blood sugar 142, bicarbonate 26, anion gap 9. No ketones in his urine. No sign of urinary tract infection. No leukocytosis. Normal LFTs, creatinine and lipase. We'll give IV fluids, Toradol, Zofran and fluid challenge patient.  ED PROGRESS: 3:00 AM  Pt reports feeling better and his abdominal exam is still benign. He is requesting discharge. He has been able to drink without further vomiting. We'll discharge with prescription of Phenergan. Discussed return precautions. He does have a PCP for follow-up. Recommended he avoid buying Percocet off the street.   At this time, I do not feel there is any life-threatening condition present. I have reviewed and discussed all results (EKG, imaging, lab, urine as appropriate) and exam findings with patient/family. I have reviewed nursing notes and appropriate previous records.  I feel the patient is safe to be discharged home without further emergent workup and can continue workup as an outpatient as needed. Discussed usual and customary return precautions. Patient/family verbalize  understanding and are comfortable with this plan.  Outpatient follow-up has been provided if needed. All questions have been answered.      Wanona Stare, Delice Bison, DO 12/01/16 [phone]

## 2017-02-23 ENCOUNTER — Encounter (HOSPITAL_COMMUNITY): Payer: Self-pay | Admitting: *Deleted

## 2017-02-23 ENCOUNTER — Other Ambulatory Visit: Payer: Self-pay

## 2017-02-23 DIAGNOSIS — E1140 Type 2 diabetes mellitus with diabetic neuropathy, unspecified: Secondary | ICD-10-CM | POA: Insufficient documentation

## 2017-02-23 DIAGNOSIS — I10 Essential (primary) hypertension: Secondary | ICD-10-CM | POA: Insufficient documentation

## 2017-02-23 DIAGNOSIS — F1110 Opioid abuse, uncomplicated: Secondary | ICD-10-CM | POA: Insufficient documentation

## 2017-02-23 DIAGNOSIS — F17210 Nicotine dependence, cigarettes, uncomplicated: Secondary | ICD-10-CM | POA: Insufficient documentation

## 2017-02-23 LAB — COMPREHENSIVE METABOLIC PANEL
ALBUMIN: 4.6 g/dL (ref 3.5–5.0)
ALK PHOS: 46 U/L (ref 38–126)
ALT: 10 U/L — AB (ref 17–63)
ANION GAP: 9 (ref 5–15)
AST: 20 U/L (ref 15–41)
BILIRUBIN TOTAL: 1.2 mg/dL (ref 0.3–1.2)
BUN: 5 mg/dL — AB (ref 6–20)
CALCIUM: 9.7 mg/dL (ref 8.9–10.3)
CO2: 26 mmol/L (ref 22–32)
CREATININE: 0.67 mg/dL (ref 0.61–1.24)
Chloride: 103 mmol/L (ref 101–111)
GFR calc Af Amer: >60 mL/min (ref >60)
GFR calc non Af Amer: >60 mL/min (ref >60)
GLUCOSE: 128 mg/dL — AB (ref 65–99)
Potassium: 3.3 mmol/L — ABNORMAL LOW (ref 3.5–5.1)
SODIUM: 138 mmol/L (ref 135–145)
TOTAL PROTEIN: 7.8 g/dL (ref 6.5–8.1)

## 2017-02-23 LAB — CBC
HCT: 40.7 % (ref 39.0–52.0)
Hemoglobin: 14 g/dL (ref 13.0–17.0)
MCH: 28.8 pg (ref 26.0–34.0)
MCHC: 34.4 g/dL (ref 30.0–36.0)
MCV: 83.7 fL (ref 78.0–100.0)
PLATELETS: 293 10*3/uL (ref 150–400)
RBC: 4.86 MIL/uL (ref 4.22–5.81)
RDW: 13.4 % (ref 11.5–15.5)
WBC: 7.3 10*3/uL (ref 4.0–10.5)

## 2017-02-23 LAB — ETHANOL: Alcohol, Ethyl (B): <10 mg/dL (ref <10)

## 2017-02-23 LAB — LIPASE, BLOOD: LIPASE: 25 U/L (ref 11–51)

## 2017-02-23 NOTE — ED Triage Notes (Signed)
The pt is c/o needing his system flushed he has been taking roxixet and percocet  He has been taking them and he is hooked on narcpotics.  C/o n and vomitiong all day

## 2017-02-24 ENCOUNTER — Emergency Department (HOSPITAL_COMMUNITY)
Admission: EM | Admit: 2017-02-24 | Discharge: 2017-02-24 | Disposition: A | Discharge status: Home / Self Care | Payer: Self-pay | Attending: Emergency Medicine | Admitting: Emergency Medicine

## 2017-02-24 DIAGNOSIS — F1110 Opioid abuse, uncomplicated: Secondary | ICD-10-CM

## 2017-02-24 LAB — SALICYLATE LEVEL: Salicylate Lvl: <7 mg/dL (ref 2.8–30.0)

## 2017-02-24 LAB — ACETAMINOPHEN LEVEL

## 2017-02-24 MED ORDER — CLONIDINE HCL 0.1 MG PO TABS
0.1000 mg | ORAL_TABLET | Freq: Once | ORAL | Status: AC
  Administered 2017-02-24: 0.1 mg via ORAL
  Filled 2017-02-24: qty 1

## 2017-02-24 MED ORDER — SODIUM CHLORIDE 0.9 % IV BOLUS (SEPSIS)
1000.0000 mL | Freq: Once | INTRAVENOUS | Status: AC
Start: 2017-02-24 — End: 2017-02-24
  Administered 2017-02-24: 1000 mL via INTRAVENOUS

## 2017-02-24 MED ORDER — CLONIDINE HCL 0.1 MG PO TABS: ORAL_TABLET | ORAL | 0 refills | Status: DC

## 2017-02-24 MED ORDER — POTASSIUM CHLORIDE CRYS ER 20 MEQ PO TBCR
40.0000 meq | EXTENDED_RELEASE_TABLET | Freq: Once | ORAL | Status: AC
  Administered 2017-02-24: 40 meq via ORAL
  Filled 2017-02-24: qty 2

## 2017-02-24 MED ORDER — ONDANSETRON HCL 4 MG/2ML IJ SOLN
4.0000 mg | Freq: Once | INTRAMUSCULAR | Status: AC
  Administered 2017-02-24: 4 mg via INTRAVENOUS
  Filled 2017-02-24: qty 2

## 2017-02-24 MED ORDER — ONDANSETRON HCL 4 MG PO TABS: 4.0000 mg | ORAL_TABLET | Freq: Four times a day (QID) | ORAL | 0 refills | Status: DC | PRN

## 2017-02-24 NOTE — Discharge Instructions (Signed)
Take ibuprofen as needed for pain - do not take acetaminophen!  Take loperamide (Imodium AD) as needed for diarrhea.

## 2017-02-24 NOTE — ED Provider Notes (Signed)
[facility] Provider Note   CSN: [id number] Arrival date & time: 02/23/17  2138     History   Chief Complaint Chief Complaint  Patient presents with  . Body Fluid Exposure    HPI David [name] is a 36 y.o. male.  The history is provided by the patient.  He has history of diabetes, hypertension, alcohol abuse.  He has been having severe diabetic nerve pain in his feet and has been taking large quantities of Percocet.  He is very evasive about how much she is taking, but states he takes 4 at a time and has done that at least 3 or 4 times in the last 24 hours.  He is currently not having pain in his feet.  He is also complaining of persistent nausea and vomiting and some mild mid abdominal pain.  This is been a year chronic, ongoing problem with him.  He denies fever or chills.  He denies constipation or diarrhea.  Past Medical History:  Diagnosis Date  . Diabetes mellitus ([facility])   . Metacarpal bone fracture 07/11/2015   right small  . 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 Alcohol abuse 04/05/2016  . Hypertension 04/05/2016  . Type 2 diabetes mellitus without complication ([facility]) 12/02/2013    Past Surgical History:  Procedure Laterality Date  . NO PAST SURGERIES         Home Medications    Prior to Admission medications   Medication Sig Start Date End Date Taking? Authorizing Provider  HYDROcodone-acetaminophen (NORCO/VICODIN) 5-325 MG tablet Take 1 tablet by mouth every 4 (four) hours as needed for moderate pain or severe pain. 05/08/16   Trixie DredgeWest, Emily, PA-C  naproxen (NAPROSYN) 500 MG tablet Take 1 tablet (500 mg total) by mouth 2 (two) times daily as needed for mild pain, moderate pain or headache (TAKE WITH MEALS.). 07/21/15   Street, RheemsMercedes, PA-C  promethazine (PHENERGAN) 25 MG tablet Take 1 tablet (25 mg total) by mouth every 6 (six)  hours as needed for nausea or vomiting. 12/01/16   Ward, Layla MawKristen N, DO    Family History Family History  Problem Relation Age of Onset  . Diabetes Father     Social History Social History   Tobacco Use  . Smoking status: Current Every Day Smoker    Packs/day: 0.50    Years: 10.00    Pack years: 5.00    Types: Cigarettes    Last attempt to quit: 10/27/2013    Years since quitting: 3.3  . Smokeless tobacco: Never Used  Substance Use Topics  . Alcohol use: Yes    Comment: one pint daily  . Drug use: No     Allergies   Patient has no known allergies.   Review of Systems Review of Systems  All other systems reviewed and are negative.    Physical Exam Updated Vital Signs BP (!) 167/78 (BP Location: Right Arm)   Pulse 65   Temp 99.5 F (37.5 C) (Oral)   Resp 18   Ht 5\' 7"  (1.702 m)   Wt 59 kg (130 lb)   SpO2 100%   BMI 20.36 kg/m   Physical Exam  Nursing note and vitals reviewed.  36 year old male, resting comfortably and in no acute distress. Vital signs are significant for hypertension. Oxygen saturation is 100%, which is normal. Head  is normocephalic and atraumatic. PERRLA, EOMI. Oropharynx is clear. Neck is nontender and supple without adenopathy or JVD. Back is nontender and there is no CVA tenderness. Lungs are clear without rales, wheezes, or rhonchi. Chest is nontender. Heart has regular rate and rhythm without murmur. Abdomen is soft, flat, nontender without masses or hepatosplenomegaly and peristalsis is hypoactive. Extremities have no cyanosis or edema, full range of motion is present. Skin is warm and dry without rash. Neurologic: Mental status is normal, cranial nerves are intact, there are no motor or sensory deficits.  He appears somewhat restless.  ED Treatments / Results  Labs (all labs ordered are listed, but only abnormal results are displayed) Labs Reviewed  COMPREHENSIVE METABOLIC PANEL - Abnormal; Notable for the following components:       Result Value   Potassium 3.3 (*)    Glucose, Bld 128 (*)    BUN 5 (*)    ALT 10 (*)    All other components within normal limits  ACETAMINOPHEN LEVEL - Abnormal; Notable for the following components:   Acetaminophen (Tylenol), Serum <10 (*)    All other components within normal limits  ETHANOL  CBC  LIPASE, BLOOD  SALICYLATE LEVEL  RAPID URINE DRUG SCREEN, HOSP PERFORMED    Procedures Procedures (including critical care time)  Medications Ordered in ED Medications  cloNIDine (CATAPRES) tablet 0.1 mg (not administered)  sodium chloride 0.9 % bolus 1,000 mL (0 mLs Intravenous Stopped 02/24/17 0336)  ondansetron (ZOFRAN) injection 4 mg (4 mg Intravenous Given 02/24/17 0208)  potassium chloride SA (K-DUR,KLOR-CON) CR tablet 40 mEq (40 mEq Oral Given 02/24/17 0208)     Initial Impression / Assessment and Plan / ED Course  I have reviewed the triage vital signs and the nursing notes.  Pertinent labs & imaging results that were available during my care of the patient were reviewed by me and considered in my medical decision making (see chart for details).  Overuse of narcotic analgesics with concern about possible overdose of acetaminophen.  He is very vague about time frames.  Will check acetaminophen level.  We will also consult with poison control once acetaminophen level is obtained.  In the meantime, he is given IV fluids and ondansetron.  Old records are reviewed showing prior ED visits for nausea and vomiting, extremity pain, and diabetes.  Lab workup is unremarkable.  Mild hypokalemia is noted.  Normal transaminases and undetectable acetaminophen level.  On further questioning, patient states that he has been taking large doses of oxycodone-acetaminophen for several months and wants to get it flushed out of his system.  I have discussed case with poison control who state that with normal transaminases, and undetectable acetaminophen level, and no evidence of altered level of  consciousness, no immediate intervention is needed regarding overdose.  Patient is discharged with prescription for clonidine and ondansetron and advised to use over-the-counter acetaminophen and loperamide as needed for withdrawal symptoms should they occur.  He is also given outpatient resources for substance abuse counseling.  Final Clinical Impressions(s) / ED Diagnoses   Final diagnoses:  Narcotic abuse [facility])    ED Discharge Orders        Ordered    cloNIDine (CATAPRES) 0.1 MG tablet     02/24/17 0324    ondansetron (ZOFRAN) 4 MG tablet  Every 6 hours PRN     02/24/17 0324       Dione Booze, MD 02/24/17 [phone]

## 2017-03-20 ENCOUNTER — Other Ambulatory Visit: Payer: Self-pay

## 2017-03-20 ENCOUNTER — Encounter (HOSPITAL_COMMUNITY): Payer: Self-pay | Admitting: Nurse Practitioner

## 2017-03-20 ENCOUNTER — Emergency Department (HOSPITAL_COMMUNITY)
Admission: EM | Admit: 2017-03-20 | Discharge: 2017-03-20 | Disposition: A | Discharge status: Home / Self Care | Payer: Self-pay | Attending: Emergency Medicine | Admitting: Emergency Medicine

## 2017-03-20 DIAGNOSIS — E1140 Type 2 diabetes mellitus with diabetic neuropathy, unspecified: Secondary | ICD-10-CM | POA: Insufficient documentation

## 2017-03-20 DIAGNOSIS — R112 Nausea with vomiting, unspecified: Secondary | ICD-10-CM | POA: Insufficient documentation

## 2017-03-20 DIAGNOSIS — I10 Essential (primary) hypertension: Secondary | ICD-10-CM | POA: Insufficient documentation

## 2017-03-20 DIAGNOSIS — F17210 Nicotine dependence, cigarettes, uncomplicated: Secondary | ICD-10-CM | POA: Insufficient documentation

## 2017-03-20 DIAGNOSIS — Z79899 Other long term (current) drug therapy: Secondary | ICD-10-CM | POA: Insufficient documentation

## 2017-03-20 HISTORY — DX: Polyneuropathy, unspecified: G62.9

## 2017-03-20 LAB — URINALYSIS, ROUTINE W REFLEX MICROSCOPIC
BILIRUBIN URINE: NEGATIVE
GLUCOSE, UA: NEGATIVE mg/dL
HGB URINE DIPSTICK: NEGATIVE
Ketones, ur: NEGATIVE mg/dL
Leukocytes, UA: NEGATIVE
Nitrite: NEGATIVE
PH: 7 (ref 5.0–8.0)
Protein, ur: NEGATIVE mg/dL
SPECIFIC GRAVITY, URINE: 1.009 (ref 1.005–1.030)

## 2017-03-20 LAB — COMPREHENSIVE METABOLIC PANEL
ALBUMIN: 4.4 g/dL (ref 3.5–5.0)
ALK PHOS: 40 U/L (ref 38–126)
ALT: 13 U/L — ABNORMAL LOW (ref 17–63)
ANION GAP: 11 (ref 5–15)
AST: 18 U/L (ref 15–41)
BUN: 8 mg/dL (ref 6–20)
CO2: 25 mmol/L (ref 22–32)
Calcium: 9.5 mg/dL (ref 8.9–10.3)
Chloride: 96 mmol/L — ABNORMAL LOW (ref 101–111)
Creatinine, Ser: 0.73 mg/dL (ref 0.61–1.24)
GFR calc Af Amer: >60 mL/min (ref >60)
GFR calc non Af Amer: >60 mL/min (ref >60)
Glucose, Bld: 122 mg/dL — ABNORMAL HIGH (ref 65–99)
POTASSIUM: 3.6 mmol/L (ref 3.5–5.1)
SODIUM: 132 mmol/L — AB (ref 135–145)
Total Bilirubin: 1.3 mg/dL — ABNORMAL HIGH (ref 0.3–1.2)
Total Protein: 7.6 g/dL (ref 6.5–8.1)

## 2017-03-20 LAB — CBC
HEMATOCRIT: 44.2 % (ref 39.0–52.0)
HEMOGLOBIN: 15.1 g/dL (ref 13.0–17.0)
MCH: 28.1 pg (ref 26.0–34.0)
MCHC: 34.2 g/dL (ref 30.0–36.0)
MCV: 82.3 fL (ref 78.0–100.0)
Platelets: 305 10*3/uL (ref 150–400)
RBC: 5.37 MIL/uL (ref 4.22–5.81)
RDW: 12.8 % (ref 11.5–15.5)
WBC: 7.1 10*3/uL (ref 4.0–10.5)

## 2017-03-20 LAB — LIPASE, BLOOD: Lipase: 25 U/L (ref 11–51)

## 2017-03-20 LAB — CBG MONITORING, ED: GLUCOSE-CAPILLARY: 119 mg/dL — AB (ref 65–99)

## 2017-03-20 MED ORDER — METOCLOPRAMIDE HCL 10 MG PO TABS: 10.0000 mg | ORAL_TABLET | Freq: Four times a day (QID) | ORAL | 0 refills | Status: DC

## 2017-03-20 MED ORDER — METOCLOPRAMIDE HCL 5 MG/ML IJ SOLN
10.0000 mg | Freq: Once | INTRAMUSCULAR | Status: AC
  Administered 2017-03-20: 10 mg via INTRAVENOUS
  Filled 2017-03-20: qty 2

## 2017-03-20 MED ORDER — ONDANSETRON 4 MG PO TBDP
4.0000 mg | ORAL_TABLET | Freq: Once | ORAL | Status: AC | PRN
  Administered 2017-03-20: 4 mg via ORAL
  Filled 2017-03-20: qty 1

## 2017-03-20 MED ORDER — DIPHENHYDRAMINE HCL 25 MG PO TABS: 25.0000 mg | ORAL_TABLET | Freq: Four times a day (QID) | ORAL | 0 refills | Status: DC

## 2017-03-20 MED ORDER — POLYETHYLENE GLYCOL 3350 17 GM/SCOOP PO POWD: 17.0000 g | Freq: Every day | ORAL | 0 refills | Status: DC

## 2017-03-20 MED ORDER — SODIUM CHLORIDE 0.9 % IV BOLUS (SEPSIS)
1000.0000 mL | Freq: Once | INTRAVENOUS | Status: AC
  Administered 2017-03-20: 1000 mL via INTRAVENOUS

## 2017-03-20 MED ORDER — DIPHENHYDRAMINE HCL 50 MG/ML IJ SOLN
25.0000 mg | Freq: Once | INTRAMUSCULAR | Status: AC
  Administered 2017-03-20: 25 mg via INTRAVENOUS
  Filled 2017-03-20: qty 1

## 2017-03-20 NOTE — ED Notes (Signed)
Lab states they cannot find the dark green tube (acetaminophen), sent by this RN at 1242.

## 2017-03-20 NOTE — Discharge Instructions (Signed)
Please read and follow all provided instructions.  Your diagnoses today include:  1. Non-intractable vomiting with nausea, unspecified vomiting type    I would like you to follow-up with Dr. Hyman Hopes later this week.  I would like to make sure that you have all prediabetes.  Your glucose was not significantly elevated today, so I do not want to restart your Lantus without primary care follow-up.  Please call the number listed for Dr. Hyman Hopes on your paperwork.  Your symptoms could be due to diabetic gastroparesis.  This is nerve damage in the belly that causes food to not move as quickly as it should.  Tests performed today include: Blood counts and electrolytes Blood tests to check liver and kidney function Blood tests to check pancreas function Urine test to look for infection and pregnancy (in women) Vital signs. See below for your results today.   Medications prescribed:   Take any prescribed medications only as directed.  Reglan.  This is a medication that you take to control nausea.  Please only take 10 mg.  Please take it with 25 mg of Benadryl.  Please do not drive, operate machinery, or work while on Benadryl.  Home care instructions:  Follow any educational materials contained in this packet.  Your abdominal pain, nausea, vomiting, and diarrhea may be caused by a viral gastroenteritis also called 'stomach flu'. You should rest for the next several days. Keep drinking plenty of fluids and use the medicine for nausea as directed.   Drink clear liquids for the next 24 hours and introduce solid foods slowly after 24 hours using the b.r.a.t. diet (Bananas, Rice, Applesauce, Toast, Yogurt).    Follow-up instructions: Please follow-up with your primary care provider in the next 2 days for further evaluation of your symptoms. If you are not feeling better in 48 hours you may have a condition that is more serious and you need re-evaluation.   Return instructions:  SEEK IMMEDIATE MEDICAL  ATTENTION IF: If you have pain that does not go away or becomes severe  A temperature above 101F develops  Repeated vomiting occurs (multiple episodes)  If you have pain that becomes localized to portions of the abdomen. The right side could possibly be appendicitis. In an adult, the left lower portion of the abdomen could be colitis or diverticulitis.  Blood is being passed in stools or vomit (bright red or black tarry stools)  You develop chest pain, difficulty breathing, dizziness or fainting, or become confused, poorly responsive, or inconsolable (young children) If you have any other emergent concerns regarding your health  Additional Information: Abdominal (belly) pain can be caused by many things. Your caregiver performed an examination and possibly ordered blood/urine tests and imaging (CT scan, x-rays, ultrasound). Many cases can be observed and treated at home after initial evaluation in the emergency department. Even though you are being discharged home, abdominal pain can be unpredictable. Therefore, you need a repeated exam if your pain does not resolve, returns, or worsens. Most patients with abdominal pain don't have to be admitted to the hospital or have surgery, but serious problems like appendicitis and gallbladder attacks can start out as nonspecific pain. Many abdominal conditions cannot be diagnosed in one visit, so follow-up evaluations are very important.  Your vital signs today were: BP (!) 135/96    Pulse 78    Temp 98 F (36.7 C) (Oral)    Resp 17    Ht 5\' 6"  (1.676 m)    Wt 59 kg (  130 lb)    SpO2 100%    BMI 20.98 kg/m  If your blood pressure (bp) was elevated above 135/85 this visit, please have this repeated by your doctor within one month. --------------

## 2017-03-20 NOTE — ED Provider Notes (Signed)
[facility] EMERGENCY DEPARTMENT Provider Note   CSN: [id number] Arrival date & time: 03/20/17  0909     History   Chief Complaint Chief Complaint  Patient presents with  . Abdominal Pain    HPI David [name] is a 37 y.o. male.  HPI   Patient is a 37 year old male with a history of insulin-dependent diabetes mellitus and alcohol use disorder presenting for nausea, emesis, and decreased appetite for the past 4 days.  Patient reports he has been minimally maintaining p.o. intake with liquids.  Patient denies hematemesis.  Patient reports that he gets some periumbilical abdominal pain prior to emesis, however the abdominal pain is not constant.  Patient reports he has not had a bowel movement in 4-5 days, and does have infrequent bowel movements.  Patient takes up to 10 pills of  Percocet 10 or 15 per day that he purchases due to diabetic nerve pain.  Patient has not seen his primary care provider in several months for insulin follow-up. Patient denies any alcohol use for the past 6 months and denies NSAID use.  Past Medical History:  Diagnosis Date  . Diabetes mellitus (HCC)   . Metacarpal bone fracture 07/11/2015   right small  . Neuropathy   . 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 Alcohol abuse 04/05/2016  . Hypertension 04/05/2016  . Type 2 diabetes mellitus without complication (HCC) 12/02/2013    Past Surgical History:  Procedure Laterality Date  . NO PAST SURGERIES         Home Medications    Prior to Admission medications   Medication Sig Start Date End Date Taking? Authorizing Provider  cloNIDine (CATAPRES) 0.1 MG tablet 1 tab po tid x 2 days, then bid x 2 days, then once daily x 2 days 02/24/17   Dione Booze, MD  HYDROcodone-acetaminophen (NORCO/VICODIN) 5-325 MG tablet Take 1 tablet by mouth every 4 (four) hours as needed for moderate pain or  severe pain. 05/08/16   [name], PA-C  naproxen (NAPROSYN) 500 MG tablet Take 1 tablet (500 mg total) by mouth 2 (two) times daily as needed for mild pain, moderate pain or headache (TAKE WITH MEALS.). 07/21/15   Street, Mercedes, PA-C  ondansetron (ZOFRAN) 4 MG tablet Take 1 tablet (4 mg total) by mouth every 6 (six) hours as needed for nausea or vomiting. 02/24/17   Dione Booze, MD  promethazine (PHENERGAN) 25 MG tablet Take 1 tablet (25 mg total) by mouth every 6 (six) hours as needed for nausea or vomiting. 12/01/16   Ward, Layla Maw, DO    Family History Family History  Problem Relation Age of Onset  . Diabetes Father     Social History Social History   Tobacco Use  . Smoking status: Current Every Day Smoker    Packs/day: 1.00    Years: 10.00    Pack years: 10.00    Types: Cigarettes  . Smokeless tobacco: Never Used  Substance Use Topics  . Alcohol use: Yes    Comment: one pint daily- sts stopped daily use a few months ago  . Drug use: No     Allergies   Patient has no known allergies.   Review of Systems Review of Systems  Constitutional: Positive for appetite change. Negative for chills and fever.  HENT: Negative for congestion and rhinorrhea.   Respiratory: Negative for  shortness of breath.   Cardiovascular: Negative for chest pain.  Gastrointestinal: Positive for abdominal pain, nausea and vomiting.  Genitourinary: Negative for discharge, dysuria, flank pain and urgency.  Musculoskeletal: Negative for back pain.  Skin: Negative for color change.  All other systems reviewed and are negative.    Physical Exam Updated Vital Signs BP (!) 135/96   Pulse 78   Temp 98 F (36.7 C) (Oral)   Resp 17   Ht 5\' 6"  (1.676 m)   Wt 59 kg (130 lb)   SpO2 100%   BMI 20.98 kg/m   Physical Exam  Constitutional: He appears well-developed and well-nourished. No distress.  HENT:  Head: Normocephalic and atraumatic.  Mouth/Throat: Oropharynx is clear and moist.    Eyes: Conjunctivae and EOM are normal. Pupils are equal, round, and reactive to light.  Neck: Normal range of motion. Neck supple.  Cardiovascular: Normal rate, regular rhythm, S1 normal and S2 normal.  No murmur heard. Pulmonary/Chest: Effort normal and breath sounds normal. He has no wheezes. He has no rales.  Abdominal: Soft. Normal appearance.  Patient exhibits discomfort to palpation of lower abdomen but abdomen is benign and nonsurgical.  Musculoskeletal: Normal range of motion. He exhibits no edema or deformity.  Lymphadenopathy:    He has no cervical adenopathy.  Neurological: He is alert.  Cranial nerves grossly intact. Patient moves extremities symmetrically and with good coordination.  Skin: Skin is warm and dry. No rash noted. No erythema.  Psychiatric: He has a normal mood and affect. His behavior is normal. Judgment and thought content normal.  Nursing note and vitals reviewed.    ED Treatments / Results  Labs (all labs ordered are listed, but only abnormal results are displayed) Labs Reviewed  COMPREHENSIVE METABOLIC PANEL - Abnormal; Notable for the following components:      Result Value   Sodium 132 (*)    Chloride 96 (*)    Glucose, Bld 122 (*)    ALT 13 (*)    Total Bilirubin 1.3 (*)    All other components within normal limits  CBG MONITORING, ED - Abnormal; Notable for the following components:   Glucose-Capillary 119 (*)    All other components within normal limits  LIPASE, BLOOD  CBC  URINALYSIS, ROUTINE W REFLEX MICROSCOPIC  ACETAMINOPHEN LEVEL    EKG  EKG Interpretation None       Radiology No results found.  Procedures Procedures (including critical care time)  Medications Ordered in ED Medications  ondansetron (ZOFRAN-ODT) disintegrating tablet 4 mg (4 mg Oral Given 03/20/17 0949)  sodium chloride 0.9 % bolus 1,000 mL (0 mLs Intravenous Stopped 03/20/17 1414)  metoCLOPramide (REGLAN) injection 10 mg (10 mg Intravenous Given 03/20/17  1252)  diphenhydrAMINE (BENADRYL) injection 25 mg (25 mg Intravenous Given 03/20/17 1251)     Initial Impression / Assessment and Plan / ED Course  I have reviewed the triage vital signs and the nursing notes.  Pertinent labs & imaging results that were available during my care of the patient were reviewed by me and considered in my medical decision making (see chart for details).      Final Clinical Impressions(s) / ED Diagnoses   Final diagnoses:  Non-intractable vomiting with nausea, unspecified vomiting type   Differential diagnosis includes constipation, diabetic gastroparesis.  Doubt DKA, as patient has no anion gap, minimal abdominal tenderness, and no ketones in his urine.  Doubt Tylenol overdose, as patient has normal transaminases.  Acetaminophen level discontinued, due to the fact  that patient has normal transaminases, and acetaminophen tube is also transport.  2:50 PM patient reevaluated.  Patient is reporting that he is asymptomatic of both nausea, emesis, or abdominal pain at this time.  Patient performed p.o. challenge without difficulty.  I did discuss with patient that taking so much Tylenol in the Percocet that he is producing of the street is dangerous, and he really needs to see his primary care provider to get assistance with his diabetic nerve pain.  Return precautions given for any increasing abdominal pain, nausea or vomiting that prevents p.o. intake, focal abdominal pain, fever or chills with abdominal pain.  Patient is in understanding and agrees with the plan of care.  This is a supervised visit with Dr. Gwyneth Sprout. Evaluation, management, and discharge planning discussed with this attending physician.   ED Discharge Orders    None       Delia Chimes 03/21/17 1240    Gwyneth Sprout, MD 03/21/17 2138

## 2017-03-20 NOTE — ED Triage Notes (Signed)
Pt endorses 4 days of generalized lower abdominal pain more on the right with nausea, decreased appetite and vomiting. Pt sts last bowel movement was approximately one week. Pt endorses over 4 episodes of vomit in the past 24 hours. Pt sts unable to keep solid foods down is only able to take ensure and liquids. Pt endorses increased belching and flatulence. Pt denies dysuria, penile discharge.

## 2017-04-18 ENCOUNTER — Ambulatory Visit: Payer: Self-pay | Attending: Internal Medicine | Admitting: Physician Assistant

## 2017-04-18 VITALS — BP 153/116 | HR 89 | Temp 98.4°F | Resp 16 | Wt 124.8 lb

## 2017-04-18 DIAGNOSIS — R112 Nausea with vomiting, unspecified: Secondary | ICD-10-CM | POA: Insufficient documentation

## 2017-04-18 DIAGNOSIS — F1129 Opioid dependence with unspecified opioid-induced disorder: Secondary | ICD-10-CM | POA: Insufficient documentation

## 2017-04-18 DIAGNOSIS — Z794 Long term (current) use of insulin: Secondary | ICD-10-CM | POA: Insufficient documentation

## 2017-04-18 DIAGNOSIS — Z79899 Other long term (current) drug therapy: Secondary | ICD-10-CM | POA: Insufficient documentation

## 2017-04-18 DIAGNOSIS — E11649 Type 2 diabetes mellitus with hypoglycemia without coma: Secondary | ICD-10-CM | POA: Insufficient documentation

## 2017-04-18 DIAGNOSIS — R569 Unspecified convulsions: Secondary | ICD-10-CM | POA: Insufficient documentation

## 2017-04-18 DIAGNOSIS — I10 Essential (primary) hypertension: Secondary | ICD-10-CM | POA: Insufficient documentation

## 2017-04-18 DIAGNOSIS — E119 Type 2 diabetes mellitus without complications: Secondary | ICD-10-CM

## 2017-04-18 DIAGNOSIS — M792 Neuralgia and neuritis, unspecified: Secondary | ICD-10-CM

## 2017-04-18 DIAGNOSIS — E1140 Type 2 diabetes mellitus with diabetic neuropathy, unspecified: Secondary | ICD-10-CM | POA: Insufficient documentation

## 2017-04-18 LAB — GLUCOSE, POCT (MANUAL RESULT ENTRY): POC Glucose: 148 mg/dl — AB (ref 70–99)

## 2017-04-18 MED ORDER — SODIUM CHLORIDE 0.9 % IV BOLUS (SEPSIS)
1000.0000 mL | Freq: Once | INTRAVENOUS | Status: AC
  Administered 2017-04-18: 1000 mL via INTRAVENOUS

## 2017-04-18 MED ORDER — ONDANSETRON HCL 4 MG PO TABS
4.0000 mg | ORAL_TABLET | Freq: Four times a day (QID) | ORAL | 0 refills | Status: DC | PRN
Start: 2017-04-18 — End: 2017-04-18

## 2017-04-18 MED ORDER — INSULIN GLARGINE 100 UNIT/ML SOLOSTAR PEN: PEN_INJECTOR | SUBCUTANEOUS | 11 refills | Status: DC

## 2017-04-18 MED ORDER — ONDANSETRON 4 MG PO TBDP
8.0000 mg | ORAL_TABLET | Freq: Once | ORAL | Status: AC
  Administered 2017-04-18: 8 mg via ORAL

## 2017-04-18 MED ORDER — METFORMIN HCL ER 500 MG PO TB24: 500.0000 mg | ORAL_TABLET | Freq: Two times a day (BID) | ORAL | 2 refills | Status: DC

## 2017-04-18 MED ORDER — GABAPENTIN 300 MG PO CAPS: 300.0000 mg | ORAL_CAPSULE | Freq: Three times a day (TID) | ORAL | 3 refills | Status: DC

## 2017-04-18 MED ORDER — "PEN NEEDLES 5/16"" 31G X 8 MM MISC": 1 refills | Status: DC

## 2017-04-18 MED FILL — ONDANSETRON HCL 4 MG TABLET: 4 | 7 days supply | Qty: 30 | Fill #0

## 2017-04-18 MED FILL — !LANTUS SOLOSTAR 100UNITS/M: 100 | 25 days supply | Qty: 9 | Fill #0

## 2017-04-18 NOTE — Progress Notes (Signed)
Patient ID: David [name], male   DOB: [birth year], 37 y.o.   MRN: [id number]     David [name], is a 37 y.o. male  WUJ:[id number]  [id number]  DOB - 12/26/1980  Subjective:  Chief Complaint and HPI: David [name] is a 37 y.o. male here today to For nausea and vomiting for about 5 days.  Unable to keep anything down.  No f/c.  No diarrhea. He was seen for similar problem 03/20/2017 in the ED.  He had been taking about 10 percocets daily per the ED note.  He denies alcohol use for about 6 months.  No NSAIDS.  No abdominal pain.  He is still taking the percocet that he initially started seeking out for help with neuropathic  Pain.  Gabapentin didn't help with neuropathic pain in the past.  Denies substances other than percocet.    He has not been checking his blood sugars regularly.  He has not been taking metformin.  He is taking Lantus 18units bid despite it not being listed in his chart as a current medication.  He denies hyper/hypoglycemia.    His wife is here with him and supportive.     ROS:   Constitutional:  No f/c, No night sweats, No unexplained weight loss. EENT:  No vision changes, No blurry vision, No hearing changes. No mouth, throat, or ear problems.  Respiratory: No cough, No SOB Cardiac: No CP, no palpitations GI:  No abd pain, + N/V no D. GU: No Urinary s/sx Musculoskeletal: No joint pain Neuro: No headache, no dizziness, no motor weakness.  Skin: No rash Endocrine:  No polydipsia. No polyuria.  Psych: Denies SI/HI  No problems updated.  ALLERGIES: No Known Allergies  PAST MEDICAL HISTORY: Past Medical History:  Diagnosis Date  . Diabetes mellitus (HCC)   . Metacarpal bone fracture 07/11/2015   right small  . Neuropathy   . Seizure with provoking factor (HCC)    states seizure was due to alcohol     MEDICATIONS AT HOME: Prior to Admission medications   Medication Sig Start Date End Date Taking? Authorizing Provider  cloNIDine (CATAPRES) 0.1 MG  tablet 1 tab po tid x 2 days, then bid x 2 days, then once daily x 2 days Patient not taking: Reported on 04/18/2017 02/24/17   Dione Booze, MD  diphenhydrAMINE (BENADRYL) 25 MG tablet Take 1 tablet (25 mg total) by mouth every 6 (six) hours. Patient not taking: Reported on 04/18/2017 03/20/17   Elisha Ponder, PA-C  HYDROcodone-acetaminophen (NORCO/VICODIN) 5-325 MG tablet Take 1 tablet by mouth every 4 (four) hours as needed for moderate pain or severe pain. Patient not taking: Reported on 04/18/2017 05/08/16   [name], PA-C  metoCLOPramide (REGLAN) 10 MG tablet Take 1 tablet (10 mg total) by mouth every 6 (six) hours. Patient not taking: Reported on 04/18/2017 03/20/17   Aviva Kluver B, PA-C  naproxen (NAPROSYN) 500 MG tablet Take 1 tablet (500 mg total) by mouth 2 (two) times daily as needed for mild pain, moderate pain or headache (TAKE WITH MEALS.). Patient not taking: Reported on 04/18/2017 07/21/15   Street, Darby, PA-C  ondansetron (ZOFRAN) 4 MG tablet Take 1 tablet (4 mg total) by mouth every 6 (six) hours as needed for nausea or vomiting. 04/18/17   Anders Simmonds, PA-C  polyethylene glycol powder (MIRALAX) powder Take 17 g by mouth daily. Patient not taking: Reported on 04/18/2017 03/20/17   Aviva Kluver B, PA-C  promethazine (PHENERGAN) 25 MG tablet Take  1 tablet (25 mg total) by mouth every 6 (six) hours as needed for nausea or vomiting. Patient not taking: Reported on 04/18/2017 12/01/16   Ward, Layla Maw, DO     Objective:  EXAM:   Vitals:   04/18/17 1031  BP: (!) 153/116  Pulse: 89  Resp: 16  Temp: 98.4 F (36.9 C)  TempSrc: Oral  SpO2: 97%  Weight: 124 lb 12.8 oz (56.6 kg)    General appearance : A&OX3. NAD. Non-toxic-appearing, dry heaving at times prior to 8mg  zofran that was given.   HEENT: Atraumatic and Normocephalic.  PERRLA. EOM intact.  TM clear B. Mouth-MMM, post pharynx WNL w/o erythema, No PND. Neck: supple, no JVD. No cervical lymphadenopathy. No  thyromegaly Chest/Lungs:  Breathing-non-labored, Good air entry bilaterally, breath sounds normal without rales, rhonchi, or wheezing  CVS: S1 S2 regular, no murmurs, gallops, rubs  Abdomen: Bowel sounds present, Non tender and not distended with no gaurding, rigidity or rebound. Extremities: Bilateral Lower Ext shows no edema, both legs are warm to touch with = pulse throughout Neurology:  CN II-XII grossly intact, Non focal.   Psych:  TP linear. J/I WNL. Normal speech. Appropriate eye contact and affect.  Skin:  No Rash  Data Review Lab Results  Component Value Date   HGBA1C 13.3 04/05/2016   HGBA1C 14.90 06/15/2014   HGBA1C 10.6 12/02/2013     Assessment & Plan   1. Nausea and vomiting, intractability of vomiting not specified, unspecified vomiting type Believe opiate related - ondansetron (ZOFRAN-ODT) disintegrating tablet 8 mg in office with resolution og=f N/V - Lipase - ondansetron (ZOFRAN) 4 MG tablet; Take 1 tablet (4 mg total) by mouth every 6 (six) hours as needed for nausea or vomiting.  Dispense: 30 tablet; Refill: 0 1 L IVF given in office  2. Type 2 diabetes mellitus without complication, without long-term current use of insulin (HCC)-- POCT glucose (manual entry) - Comprehensive metabolic panel - Hemoglobin A1c Continue lantus 18 units bid until A1C is back.  Hold if any hypoglycemia Restart metformin 500mg  bid  3. Essential hypertension Uncontrolled but hasn't taken meds - CBC with Differential/Platelet  4. Opioid dependence with opioid-induced disorder (HCC) I have counseled the patient at length about substance abuse and addiction.  12 step meetings/recovery recommended.  Local 12 step meeting lists were given and attendance was encouraged.  Patient expresses understanding. Resources given for [employer], [facility], etc provided by Child psychotherapist.     Patient have been counseled extensively about nutrition and exercise  Return in about 2 months (around  06/16/2017) for assign new PCP; DM/htn management.  .(2 weeks; not 2 months)  The patient was given clear instructions to go to ER or return to medical center if symptoms don't improve, worsen or new problems develop. The patient verbalized understanding. The patient was told to call to get lab results if they haven't heard anything in the next week.     Georgian Co, PA-C [facility] [place], [place] [phone]   04/18/2017, 11:04 AM

## 2017-04-18 NOTE — Progress Notes (Signed)
Pt is in the office today because he can't keep anything down   Pt states he has been vomiting fir almost a week  Pt states he drinks fluids but can't keep it down

## 2017-04-18 NOTE — Patient Instructions (Addendum)
Check blood sugars fasting and at bedtime.   Call [facility], [facility], and [employer].  You can also seek help with detox at behavioral health at [email or web address] for NA meetings  [email or web address] for AA meetings.

## 2017-04-19 ENCOUNTER — Other Ambulatory Visit: Payer: Self-pay | Admitting: Physician Assistant

## 2017-04-19 LAB — COMPREHENSIVE METABOLIC PANEL
A/G RATIO: 1.4 (ref 1.2–2.2)
ALBUMIN: 4.4 g/dL (ref 3.5–5.5)
ALK PHOS: 49 IU/L (ref 39–117)
ALT: 7 IU/L (ref 0–44)
AST: 11 IU/L (ref 0–40)
BILIRUBIN TOTAL: 1.2 mg/dL (ref 0.0–1.2)
BUN / CREAT RATIO: 10 (ref 9–20)
BUN: 7 mg/dL (ref 6–20)
CHLORIDE: 86 mmol/L — AB (ref 96–106)
CO2: 30 mmol/L — ABNORMAL HIGH (ref 20–29)
Calcium: 9.6 mg/dL (ref 8.7–10.2)
Creatinine, Ser: 0.71 mg/dL — ABNORMAL LOW (ref 0.76–1.27)
GFR calc non Af Amer: 122 mL/min/{1.73_m2} (ref >59)
GFR, EST AFRICAN AMERICAN: 142 mL/min/{1.73_m2} (ref >59)
GLOBULIN, TOTAL: 3.2 g/dL (ref 1.5–4.5)
GLUCOSE: 129 mg/dL — AB (ref 65–99)
Potassium: 2.9 mmol/L — ABNORMAL LOW (ref 3.5–5.2)
SODIUM: 134 mmol/L (ref 134–144)
TOTAL PROTEIN: 7.6 g/dL (ref 6.0–8.5)

## 2017-04-19 LAB — CBC WITH DIFFERENTIAL/PLATELET
Basophils Absolute: 0 10*3/uL (ref 0.0–0.2)
Basos: 1 %
EOS (ABSOLUTE): 0.2 10*3/uL (ref 0.0–0.4)
EOS: 2 %
HEMATOCRIT: 42.6 % (ref 37.5–51.0)
HEMOGLOBIN: 15.3 g/dL (ref 13.0–17.7)
Immature Grans (Abs): 0 10*3/uL (ref 0.0–0.1)
Immature Granulocytes: 0 %
LYMPHS ABS: 1.9 10*3/uL (ref 0.7–3.1)
Lymphs: 27 %
MCH: 28.2 pg (ref 26.6–33.0)
MCHC: 35.9 g/dL — AB (ref 31.5–35.7)
MCV: 79 fL (ref 79–97)
MONOCYTES: 9 %
MONOS ABS: 0.6 10*3/uL (ref 0.1–0.9)
NEUTROS ABS: 4.3 10*3/uL (ref 1.4–7.0)
Neutrophils: 61 %
Platelets: 302 10*3/uL (ref 150–379)
RBC: 5.42 x10E6/uL (ref 4.14–5.80)
RDW: 13.2 % (ref 12.3–15.4)
WBC: 7 10*3/uL (ref 3.4–10.8)

## 2017-04-19 LAB — LIPASE: Lipase: 13 U/L (ref 13–78)

## 2017-04-19 MED ORDER — POTASSIUM CHLORIDE CRYS ER 20 MEQ PO TBCR: 20.0000 meq | EXTENDED_RELEASE_TABLET | Freq: Every day | ORAL | 0 refills | Status: DC

## 2017-04-19 MED FILL — POTASSIUM CL ER 20 MEQ TAB: 20 | 7 days supply | Qty: 7 | Fill #0

## 2017-04-20 LAB — HEMOGLOBIN A1C
Est. average glucose Bld gHb Est-mCnc: 134 mg/dL
Hgb A1c MFr Bld: 6.3 % — ABNORMAL HIGH (ref 4.8–5.6)

## 2017-04-20 LAB — SPECIMEN STATUS REPORT

## 2017-04-23 ENCOUNTER — Telehealth: Payer: Self-pay

## 2017-04-23 NOTE — Telephone Encounter (Signed)
Contacted pt to go over lab results pt is aware and will be coming by the pharmacy to pick up rx for K now

## 2017-05-16 MED FILL — METFORMIN HCL ER 500 MG TAB: 500 | 30 days supply | Qty: 60 | Fill #0

## 2017-05-16 MED FILL — GABAPENTIN 300 MG CAPSULE: 300 | 30 days supply | Qty: 90 | Fill #0

## 2018-07-23 ENCOUNTER — Emergency Department (HOSPITAL_COMMUNITY)
Admission: EM | Admit: 2018-07-23 | Discharge: 2018-07-23 | Disposition: A | Discharge status: Home / Self Care | Payer: Self-pay | Attending: Emergency Medicine | Admitting: Emergency Medicine

## 2018-07-23 ENCOUNTER — Other Ambulatory Visit: Payer: Self-pay

## 2018-07-23 DIAGNOSIS — E1165 Type 2 diabetes mellitus with hyperglycemia: Secondary | ICD-10-CM | POA: Insufficient documentation

## 2018-07-23 DIAGNOSIS — Z794 Long term (current) use of insulin: Secondary | ICD-10-CM | POA: Insufficient documentation

## 2018-07-23 DIAGNOSIS — I10 Essential (primary) hypertension: Secondary | ICD-10-CM | POA: Insufficient documentation

## 2018-07-23 DIAGNOSIS — F17210 Nicotine dependence, cigarettes, uncomplicated: Secondary | ICD-10-CM | POA: Insufficient documentation

## 2018-07-23 DIAGNOSIS — E1140 Type 2 diabetes mellitus with diabetic neuropathy, unspecified: Secondary | ICD-10-CM | POA: Insufficient documentation

## 2018-07-23 DIAGNOSIS — R739 Hyperglycemia, unspecified: Secondary | ICD-10-CM

## 2018-07-23 DIAGNOSIS — R112 Nausea with vomiting, unspecified: Secondary | ICD-10-CM

## 2018-07-23 DIAGNOSIS — Z79899 Other long term (current) drug therapy: Secondary | ICD-10-CM | POA: Insufficient documentation

## 2018-07-23 LAB — CBC WITH DIFFERENTIAL/PLATELET
Abs Immature Granulocytes: 0.01 10*3/uL (ref 0.00–0.07)
Basophils Absolute: 0 10*3/uL (ref 0.0–0.1)
Basophils Relative: 1 %
Eosinophils Absolute: 0.1 10*3/uL (ref 0.0–0.5)
Eosinophils Relative: 2 %
HCT: 47.2 % (ref 39.0–52.0)
Hemoglobin: 15.6 g/dL (ref 13.0–17.0)
Immature Granulocytes: 0 %
Lymphocytes Relative: 32 %
Lymphs Abs: 1.7 10*3/uL (ref 0.7–4.0)
MCH: 29.3 pg (ref 26.0–34.0)
MCHC: 33.1 g/dL (ref 30.0–36.0)
MCV: 88.7 fL (ref 80.0–100.0)
Monocytes Absolute: 0.5 10*3/uL (ref 0.1–1.0)
Monocytes Relative: 10 %
Neutro Abs: 2.9 10*3/uL (ref 1.7–7.7)
Neutrophils Relative %: 55 %
Platelets: 251 10*3/uL (ref 150–400)
RBC: 5.32 MIL/uL (ref 4.22–5.81)
RDW: 13.8 % (ref 11.5–15.5)
WBC: 5.2 10*3/uL (ref 4.0–10.5)
nRBC: 0 % (ref 0.0–0.2)

## 2018-07-23 LAB — URINALYSIS, ROUTINE W REFLEX MICROSCOPIC
Bilirubin Urine: NEGATIVE
Glucose, UA: NEGATIVE mg/dL
Hgb urine dipstick: NEGATIVE
Ketones, ur: NEGATIVE mg/dL
Leukocytes,Ua: NEGATIVE
Nitrite: NEGATIVE
Protein, ur: NEGATIVE mg/dL
Specific Gravity, Urine: 1.023 (ref 1.005–1.030)
pH: 6 (ref 5.0–8.0)

## 2018-07-23 LAB — COMPREHENSIVE METABOLIC PANEL
ALT: 16 U/L (ref 0–44)
AST: 32 U/L (ref 15–41)
Albumin: 3.6 g/dL (ref 3.5–5.0)
Alkaline Phosphatase: 43 U/L (ref 38–126)
Anion gap: 10 (ref 5–15)
BUN: 7 mg/dL (ref 6–20)
CO2: 24 mmol/L (ref 22–32)
Calcium: 9 mg/dL (ref 8.9–10.3)
Chloride: 104 mmol/L (ref 98–111)
Creatinine, Ser: 1.05 mg/dL (ref 0.61–1.24)
GFR calc Af Amer: >60 mL/min (ref >60)
GFR calc non Af Amer: >60 mL/min (ref >60)
Glucose, Bld: 141 mg/dL — ABNORMAL HIGH (ref 70–99)
Potassium: 3.7 mmol/L (ref 3.5–5.1)
Sodium: 138 mmol/L (ref 135–145)
Total Bilirubin: 0.9 mg/dL (ref 0.3–1.2)
Total Protein: 6.6 g/dL (ref 6.5–8.1)

## 2018-07-23 LAB — CBG MONITORING, ED: Glucose-Capillary: 128 mg/dL — ABNORMAL HIGH (ref 70–99)

## 2018-07-23 MED ORDER — SODIUM CHLORIDE 0.9 % IV BOLUS
1000.0000 mL | Freq: Once | INTRAVENOUS | Status: AC
  Administered 2018-07-23: 1000 mL via INTRAVENOUS

## 2018-07-23 MED ORDER — METFORMIN HCL ER 500 MG PO TB24: 500.0000 mg | ORAL_TABLET | Freq: Two times a day (BID) | ORAL | 0 refills | Status: DC

## 2018-07-23 NOTE — ED Provider Notes (Signed)
[facility]   CSN: [id number] Arrival date & time: 07/23/18  [id number]    History   Chief Complaint Chief Complaint  Patient presents with  . Hyperglycemia    HPI Hazle Nordmannntonio T Khaimov is a 38 y.o. male.     The history is provided by the patient and medical records. No language interpreter was used.  Hyperglycemia  Associated symptoms: increased thirst and polyuria    Jac T Durwin GlazeGlover is a 38 y.o. male  with a PMH of DM who presents to the Emergency Department for evaluation as he feels his blood sugar may be high.  Patient states that he has been out of his diabetes medications for the last month.  Typically takes Lantus and metformin.  Over the last 2 to 3 days, he has been very thirsty and urinating more than normal.  He says that he is are similar symptoms as to when he was first diagnosed with diabetes.  He does not have a glucometer at home, therefore came to ER to get blood sugar checked.  He has not eaten anything today.  CBG in triage 128.  He denies any abdominal pain, nausea or vomiting.  No fever or chills.  No confusion or mental status changes.  He states that he did not call his primary care clinic as he did not know if they were open or not given ongoing pandemic.   Past Medical History:  Diagnosis Date  . Diabetes mellitus (HCC)   . Metacarpal bone fracture 07/11/2015   right small  . Neuropathy   . 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 Alcohol abuse 04/05/2016  . Hypertension 04/05/2016  . Type 2 diabetes mellitus without complication (HCC) 12/02/2013    Past Surgical History:  Procedure Laterality Date  . NO PAST SURGERIES          Home Medications    Prior to Admission medications   Medication Sig Start Date End Date Taking? Authorizing Provider  gabapentin (NEURONTIN) 300 MG capsule Take 1 capsule (300 mg  total) by mouth 3 (three) times daily. 04/18/17  Yes McClung, Angela M, PA-C  Insulin Glargine (LANTUS) 100 UNIT/ML Solostar Pen 18 units bid is current dosin Patient taking differently: Inject 22 Units into the skin 2 (two) times daily.  04/18/17  Yes Georgian CoMcClung, Angela M, PA-C  potassium chloride SA (K-DUR,KLOR-CON) 20 MEQ tablet Take 1 tablet (20 mEq total) by mouth daily. 04/19/17  Yes Anders SimmondsMcClung, Angela M, PA-C  Insulin Pen Needle (PEN NEEDLES 31GX5/16") 31G X 8 MM MISC As directed 04/18/17   Anders SimmondsMcClung, Angela M, PA-C  metFORMIN (GLUCOPHAGE-XR) 500 MG 24 hr tablet Take 1 tablet (500 mg total) by mouth 2 (two) times daily. 07/23/18   Uriah Trueba, Chase PicketJaime Pilcher, PA-C    Family History Family History  Problem Relation Age of Onset  . Diabetes Father     Social History Social History   Tobacco Use  . Smoking status: Current Every Day Smoker    Packs/day: 1.00    Years: 10.00    Pack years: 10.00    Types: Cigarettes  . Smokeless tobacco: Never Used  Substance Use Topics  . Alcohol use: Yes    Comment: one pint daily- sts stopped daily use a few months ago  . Drug use: No     Allergies   Patient has  no known allergies.   Review of Systems Review of Systems  Endocrine: Positive for polydipsia and polyuria.  All other systems reviewed and are negative.    Physical Exam Updated Vital Signs BP (!) 154/84   Pulse 78   Temp 98.1 F (36.7 C) (Oral)   Resp 18   Ht 5\' 7"  (1.702 m)   SpO2 100%   BMI 19.55 kg/m   Physical Exam Vitals signs and nursing note reviewed.  Constitutional:      General: He is not in acute distress.    Appearance: He is well-developed.  HENT:     Head: Normocephalic and atraumatic.  Neck:     Musculoskeletal: Neck supple.  Cardiovascular:     Rate and Rhythm: Normal rate and regular rhythm.     Heart sounds: Normal heart sounds. No murmur.  Pulmonary:     Effort: Pulmonary effort is normal. No respiratory distress.     Breath sounds: Normal breath  sounds.  Abdominal:     General: There is no distension.     Palpations: Abdomen is soft.     Tenderness: There is no abdominal tenderness.  Skin:    General: Skin is warm and dry.  Neurological:     Mental Status: He is alert and oriented to person, place, and time.      ED Treatments / Results  Labs (all labs ordered are listed, but only abnormal results are displayed) Labs Reviewed  COMPREHENSIVE METABOLIC PANEL - Abnormal; Notable for the following components:      Result Value   Glucose, Bld 141 (*)    All other components within normal limits  CBG MONITORING, ED - Abnormal; Notable for the following components:   Glucose-Capillary 128 (*)    All other components within normal limits  CBC WITH DIFFERENTIAL/PLATELET  URINALYSIS, ROUTINE W REFLEX MICROSCOPIC    EKG None  Radiology No results found.  Procedures Procedures (including critical care time)  Medications Ordered in ED Medications  sodium chloride 0.9 % bolus 1,000 mL (1,000 mLs Intravenous New Bag/Given 07/23/18 0958)     Initial Impression / Assessment and Plan / ED Course  I have reviewed the triage vital signs and the nursing notes.  Pertinent labs & imaging results that were available during my care of the patient were reviewed by me and considered in my medical decision making (see chart for details).       Khi JAHMIR SOKALSKI is a 38 y.o. male who presents to ED for  polydipsia over the last 2 to 3 days.  He does have a history of diabetes and has not taken his medications in the last month.  On exam today, patient is afebrile, hemodynamically stable with benign abdominal exam.  Glucose in triage of 128.  He states he has not eaten or drank anything today.  Given he has been out of his medications for about a month, will obtain labs and urine to further evaluate.  I did discuss the importance of him taking his medications as prescribed and following up with his PCP regularly.   Labs reviewed and  reassuring with glucose of 141.  No anion gap. UA wdl.   Evaluation does not show pathology that would require ongoing emergent intervention or inpatient treatment.  Will refill his metformin and have him follow-up with primary care.  Reasons to return to the ER were discussed and all questions were answered.  Final Clinical Impressions(s) / ED Diagnoses   Final diagnoses:  Hyperglycemia  ED Discharge Orders         Ordered    metFORMIN (GLUCOPHAGE-XR) 500 MG 24 hr tablet  2 times daily     07/23/18 1105           Jazman Reuter, Chase Picket, PA-C 07/23/18 1107    Terrilee Files, MD 07/23/18 1759

## 2018-07-23 NOTE — Discharge Instructions (Addendum)
It was my pleasure taking care of you today!  I have refilled your Metformin. Take as directed.   Follow your diabetic diet closely.   Call your primary care doctor today to schedule a follow up appointment.   Return to ER for new or worsening symptoms, any additional concerns.

## 2018-07-23 NOTE — ED Notes (Signed)
Pt CBG, 128 RN notified. 

## 2018-07-23 NOTE — ED Notes (Signed)
Pt aware of need for urine sample, urinal at bedside, told to use call bell to alert staff that specimen is ready

## 2018-07-23 NOTE — ED Triage Notes (Signed)
Pt in stating he feels his CBG is high, was unable to check it PTA. Has been very thirsty, dizzy and urinating a lot. Hx of DM

## 2018-07-23 NOTE — ED Triage Notes (Signed)
2 day hx of increased urination and thirst. Nausea x 1 yesterday, none today.  Pain free. Alert and oriented.

## 2018-12-09 ENCOUNTER — Ambulatory Visit: Payer: Self-pay | Admitting: Internal Medicine

## 2019-03-11 ENCOUNTER — Ambulatory Visit: Payer: Self-pay | Admitting: Internal Medicine

## 2019-05-06 ENCOUNTER — Other Ambulatory Visit: Payer: Self-pay

## 2019-05-06 ENCOUNTER — Ambulatory Visit: Payer: Self-pay | Attending: Nurse Practitioner | Admitting: Nurse Practitioner

## 2019-05-20 ENCOUNTER — Other Ambulatory Visit: Payer: Self-pay

## 2019-05-20 ENCOUNTER — Ambulatory Visit: Payer: Self-pay | Attending: Nurse Practitioner | Admitting: Nurse Practitioner

## 2019-10-04 ENCOUNTER — Other Ambulatory Visit: Payer: Self-pay

## 2019-10-04 ENCOUNTER — Emergency Department (HOSPITAL_COMMUNITY)
Admission: EM | Admit: 2019-10-04 | Discharge: 2019-10-04 | Disposition: A | Discharge status: Home / Self Care | Payer: Self-pay | Attending: Emergency Medicine | Admitting: Emergency Medicine

## 2019-10-04 ENCOUNTER — Encounter (HOSPITAL_COMMUNITY): Payer: Self-pay | Admitting: Emergency Medicine

## 2019-10-04 DIAGNOSIS — R110 Nausea: Secondary | ICD-10-CM | POA: Insufficient documentation

## 2019-10-04 DIAGNOSIS — Z5321 Procedure and treatment not carried out due to patient leaving prior to being seen by health care provider: Secondary | ICD-10-CM | POA: Insufficient documentation

## 2019-10-04 LAB — COMPREHENSIVE METABOLIC PANEL
ALT: 32 U/L (ref 0–44)
AST: 40 U/L (ref 15–41)
Albumin: 4.5 g/dL (ref 3.5–5.0)
Alkaline Phosphatase: 48 U/L (ref 38–126)
Anion gap: 12 (ref 5–15)
BUN: 12 mg/dL (ref 6–20)
CO2: 27 mmol/L (ref 22–32)
Calcium: 9.6 mg/dL (ref 8.9–10.3)
Chloride: 91 mmol/L — ABNORMAL LOW (ref 98–111)
Creatinine, Ser: 0.89 mg/dL (ref 0.61–1.24)
GFR calc Af Amer: >60 mL/min (ref >60)
GFR calc non Af Amer: >60 mL/min (ref >60)
Glucose, Bld: 162 mg/dL — ABNORMAL HIGH (ref 70–99)
Potassium: 3.3 mmol/L — ABNORMAL LOW (ref 3.5–5.1)
Sodium: 130 mmol/L — ABNORMAL LOW (ref 135–145)
Total Bilirubin: 2.1 mg/dL — ABNORMAL HIGH (ref 0.3–1.2)
Total Protein: 7.9 g/dL (ref 6.5–8.1)

## 2019-10-04 LAB — CBC
HCT: 52.9 % — ABNORMAL HIGH (ref 39.0–52.0)
Hemoglobin: 17.5 g/dL — ABNORMAL HIGH (ref 13.0–17.0)
MCH: 27.8 pg (ref 26.0–34.0)
MCHC: 33.1 g/dL (ref 30.0–36.0)
MCV: 84 fL (ref 80.0–100.0)
Platelets: 339 10*3/uL (ref 150–400)
RBC: 6.3 MIL/uL — ABNORMAL HIGH (ref 4.22–5.81)
RDW: 12.5 % (ref 11.5–15.5)
WBC: 11.7 10*3/uL — ABNORMAL HIGH (ref 4.0–10.5)
nRBC: 0 % (ref 0.0–0.2)

## 2019-10-04 LAB — LIPASE, BLOOD: Lipase: 26 U/L (ref 11–51)

## 2019-10-04 MED ORDER — SODIUM CHLORIDE 0.9% FLUSH: 3.0000 mL | Freq: Once | INTRAVENOUS | Status: DC

## 2019-10-04 NOTE — ED Triage Notes (Signed)
Pt. Stated, Ive taken Percocet and it makes me nausea. They were not prescribed.

## 2019-10-18 ENCOUNTER — Other Ambulatory Visit: Payer: Self-pay

## 2019-10-18 ENCOUNTER — Encounter (HOSPITAL_COMMUNITY): Payer: Self-pay

## 2019-10-18 ENCOUNTER — Ambulatory Visit (HOSPITAL_COMMUNITY)
Admission: EM | Admit: 2019-10-18 | Discharge: 2019-10-18 | Disposition: A | Discharge status: Home / Self Care | Payer: Self-pay | Attending: Family Medicine | Admitting: Family Medicine

## 2019-10-18 DIAGNOSIS — R112 Nausea with vomiting, unspecified: Secondary | ICD-10-CM

## 2019-10-18 DIAGNOSIS — R110 Nausea: Secondary | ICD-10-CM

## 2019-10-18 DIAGNOSIS — E1365 Other specified diabetes mellitus with hyperglycemia: Secondary | ICD-10-CM

## 2019-10-18 DIAGNOSIS — R531 Weakness: Secondary | ICD-10-CM

## 2019-10-18 DIAGNOSIS — K5900 Constipation, unspecified: Secondary | ICD-10-CM

## 2019-10-18 DIAGNOSIS — F1110 Opioid abuse, uncomplicated: Secondary | ICD-10-CM

## 2019-10-18 DIAGNOSIS — R140 Abdominal distension (gaseous): Secondary | ICD-10-CM

## 2019-10-18 LAB — CBG MONITORING, ED: Glucose-Capillary: 131 mg/dL — ABNORMAL HIGH (ref 70–99)

## 2019-10-18 MED ORDER — FLEET ENEMA 7-19 GM/118ML RE ENEM: 1.0000 | ENEMA | Freq: Every day | RECTAL | 2 refills | Status: DC | PRN

## 2019-10-18 MED ORDER — DOCUSATE SODIUM 100 MG PO CAPS: 100.0000 mg | ORAL_CAPSULE | Freq: Two times a day (BID) | ORAL | 0 refills | Status: DC

## 2019-10-18 MED ORDER — AMLODIPINE BESYLATE 5 MG PO TABS: 5.0000 mg | ORAL_TABLET | Freq: Every day | ORAL | 0 refills | Status: DC

## 2019-10-18 NOTE — ED Triage Notes (Addendum)
Pt presents with nausea, burning foot and feeling weak for the past week. Pt states he is taking Percocet, last dose was yesterday. Percocet was not prescribed to him. Percocet makes him nauseous when taking without food.

## 2019-10-18 NOTE — ED Provider Notes (Signed)
[facility]   MRN: [id number] DOB: Nov 18, 1980  Subjective:   David [name] is a 39 y.o. male presenting for 4-day history of persistent nausea with vomiting, inability to hold fluids down.  Has not started having trouble with liquids as well.  Patient states that for the past week he has been using Percocet, not his own prescription but somebody else's.  States that he was using this because of foot burning and pain in his feet bilaterally.  He has known uncontrolled diabetes and is not taking any of his medications due to being unable to afford them.  Has a history of diabetic polyneuropathy.  Also has a history of essential hypertension and again is not on any medications due to financial burden.  He is still passing gas, denies history of belly surgeries.  Denies chest pain, shortness of breath, confusion.  No current facility-administered medications for this encounter.  Current Outpatient Medications:  .  gabapentin (NEURONTIN) 300 MG capsule, Take 1 capsule (300 mg total) by mouth 3 (three) times daily., Disp: 90 capsule, Rfl: 3 .  Insulin Glargine (LANTUS) 100 UNIT/ML Solostar Pen, 18 units bid is current dosin (Patient taking differently: Inject 22 Units into the skin 2 (two) times daily. ), Disp: 15 mL, Rfl: 11 .  Insulin Pen Needle (PEN NEEDLES 31GX5/16") 31G X 8 MM MISC, As directed, Disp: 100 each, Rfl: 1 .  metFORMIN (GLUCOPHAGE-XR) 500 MG 24 hr tablet, Take 1 tablet (500 mg total) by mouth 2 (two) times daily., Disp: 60 tablet, Rfl: 0 .  potassium chloride SA (K-DUR,KLOR-CON) 20 MEQ tablet, Take 1 tablet (20 mEq total) by mouth daily., Disp: 7 tablet, Rfl: 0   No Known Allergies  Past Medical History:  Diagnosis Date  . Diabetes mellitus (HCC)   . Metacarpal bone fracture 07/11/2015   right small  . Neuropathy   . Seizure with provoking factor [facility])    states seizure was due to alcohol      Past Surgical History:  Procedure Laterality Date  . NO PAST SURGERIES       Family History  Problem Relation Age of Onset  . Diabetes Father     Social History   Tobacco Use  . Smoking status: Current Every Day Smoker    Packs/day: 1.00    Years: 10.00    Pack years: 10.00    Types: Cigarettes  . Smokeless tobacco: Never Used  Substance Use Topics  . Alcohol use: Yes    Comment: one pint daily- sts stopped daily use a few months ago  . Drug use: No    ROS   Objective:   Vitals: BP (!) 160/104 (BP Location: Right Arm)   Pulse 100   Temp 98.4 F (36.9 C) (Oral)   Resp 18   SpO2 100%   Physical Exam Constitutional:      General: He is not in acute distress.    Appearance: Normal appearance. He is well-developed. He is not ill-appearing, toxic-appearing or diaphoretic.  HENT:     Head: Normocephalic and atraumatic.     Right Ear: External ear normal.     Left Ear: External ear normal.     Nose: Nose normal.     Mouth/Throat:     Mouth: Mucous membranes are moist.     Pharynx: Oropharynx is clear.  Eyes:     General: No scleral icterus.       Right eye: No discharge.        Left  eye: No discharge.     Extraocular Movements: Extraocular movements intact.     Conjunctiva/sclera: Conjunctivae normal.     Pupils: Pupils are equal, round, and reactive to light.  Cardiovascular:     Rate and Rhythm: Normal rate and regular rhythm.     Heart sounds: Normal heart sounds. No murmur heard.  No friction rub. No gallop.   Pulmonary:     Effort: Pulmonary effort is normal. No respiratory distress.     Breath sounds: Normal breath sounds. No stridor. No wheezing, rhonchi or rales.  Abdominal:     General: Bowel sounds are normal. There is no distension.     Palpations: Abdomen is soft. There is no mass.     Tenderness: There is no abdominal tenderness. There is no right CVA tenderness, left CVA tenderness, guarding or rebound.  Skin:    General: Skin is warm and dry.  Neurological:     Mental Status: He is alert and oriented to person,  place, and time.     Cranial Nerves: No cranial nerve deficit.     Motor: No weakness.     Coordination: Coordination normal.     Gait: Gait normal.     Deep Tendon Reflexes: Reflexes normal.  Psychiatric:        Mood and Affect: Mood normal.        Behavior: Behavior normal.        Thought Content: Thought content normal.        Judgment: Judgment normal.     Results for orders placed or performed during the hospital encounter of 10/18/19 (from the past 24 hour(s))  POC CBG monitoring     Status: Abnormal   Collection Time: 10/18/19  2:07 PM  Result Value Ref Range   Glucose-Capillary 131 (H) 70 - 99 mg/dL    Assessment and Plan :   PDMP not reviewed this encounter.  1. Constipation, unspecified constipation type   2. Abdominal bloating   3. Uncontrolled other specified diabetes mellitus with hyperglycemia (HCC)   4. Nausea and vomiting, intractability of vomiting not specified, unspecified vomiting type   5. Narcotic abuse [facility])     Patient has uncontrolled diabetes with polyneuropathy that drove him to use Percocet.  Suspect this is a source of his symptoms, has opioid-induced constipation.  Emphasized need to stop using this medication, start using docusate and trial an enema to help and have a complete bowel movement.  He has an appointment set up next month with a new PCP through [facility] internal medicine to obtain his medications.  I did emphasize need to start taking amlodipine for his blood pressure but he did not want the other medications as he cannot afford them and is going to get them through his appointment next month. Counseled patient on potential for adverse effects with medications prescribed/recommended today, ER and return-to-clinic precautions discussed, patient verbalized understanding.    Wallis Bamberg, PA-C 10/18/19 1458

## 2019-10-19 ENCOUNTER — Telehealth (HOSPITAL_COMMUNITY): Payer: Self-pay | Admitting: Urgent Care

## 2019-10-19 DIAGNOSIS — E119 Type 2 diabetes mellitus without complications: Secondary | ICD-10-CM

## 2019-10-19 MED ORDER — INSULIN GLARGINE 100 UNIT/ML SOLOSTAR PEN: PEN_INJECTOR | SUBCUTANEOUS | 0 refills | Status: DC

## 2019-10-19 NOTE — Telephone Encounter (Signed)
Patient requested refill of his Lantus, states that he found someone to pay for it. Script sent for original dosing on file.

## 2019-11-11 ENCOUNTER — Ambulatory Visit (INDEPENDENT_AMBULATORY_CARE_PROVIDER_SITE_OTHER): Payer: Self-pay | Admitting: Primary Care

## 2020-05-15 ENCOUNTER — Other Ambulatory Visit: Payer: Self-pay

## 2020-05-15 ENCOUNTER — Encounter (HOSPITAL_COMMUNITY): Payer: Self-pay | Admitting: Emergency Medicine

## 2020-05-15 ENCOUNTER — Emergency Department (HOSPITAL_COMMUNITY)
Admission: EM | Admit: 2020-05-15 | Discharge: 2020-05-15 | Disposition: A | Discharge status: Home / Self Care | Payer: Self-pay | Attending: Emergency Medicine | Admitting: Emergency Medicine

## 2020-05-15 ENCOUNTER — Ambulatory Visit (HOSPITAL_COMMUNITY)
Admission: EM | Admit: 2020-05-15 | Discharge: 2020-05-15 | Disposition: A | Discharge status: Other Acute Inpatient Hospital | Payer: Self-pay | Attending: Emergency Medicine | Admitting: Emergency Medicine

## 2020-05-15 ENCOUNTER — Encounter (HOSPITAL_COMMUNITY): Payer: Self-pay

## 2020-05-15 DIAGNOSIS — F17210 Nicotine dependence, cigarettes, uncomplicated: Secondary | ICD-10-CM | POA: Insufficient documentation

## 2020-05-15 DIAGNOSIS — I160 Hypertensive urgency: Secondary | ICD-10-CM

## 2020-05-15 DIAGNOSIS — Z79899 Other long term (current) drug therapy: Secondary | ICD-10-CM | POA: Insufficient documentation

## 2020-05-15 DIAGNOSIS — E1140 Type 2 diabetes mellitus with diabetic neuropathy, unspecified: Secondary | ICD-10-CM | POA: Insufficient documentation

## 2020-05-15 DIAGNOSIS — Z7984 Long term (current) use of oral hypoglycemic drugs: Secondary | ICD-10-CM | POA: Insufficient documentation

## 2020-05-15 DIAGNOSIS — R112 Nausea with vomiting, unspecified: Secondary | ICD-10-CM | POA: Insufficient documentation

## 2020-05-15 DIAGNOSIS — Z794 Long term (current) use of insulin: Secondary | ICD-10-CM | POA: Insufficient documentation

## 2020-05-15 DIAGNOSIS — I10 Essential (primary) hypertension: Secondary | ICD-10-CM | POA: Insufficient documentation

## 2020-05-15 LAB — COMPREHENSIVE METABOLIC PANEL
ALT: 11 U/L (ref 0–44)
AST: 17 U/L (ref 15–41)
Albumin: 4.3 g/dL (ref 3.5–5.0)
Alkaline Phosphatase: 37 U/L — ABNORMAL LOW (ref 38–126)
Anion gap: 10 (ref 5–15)
BUN: 6 mg/dL (ref 6–20)
CO2: 26 mmol/L (ref 22–32)
Calcium: 9.5 mg/dL (ref 8.9–10.3)
Chloride: 100 mmol/L (ref 98–111)
Creatinine, Ser: 0.92 mg/dL (ref 0.61–1.24)
GFR, Estimated: >60 mL/min (ref >60)
Glucose, Bld: 153 mg/dL — ABNORMAL HIGH (ref 70–99)
Potassium: 3.5 mmol/L (ref 3.5–5.1)
Sodium: 136 mmol/L (ref 135–145)
Total Bilirubin: 1.2 mg/dL (ref 0.3–1.2)
Total Protein: 7.8 g/dL (ref 6.5–8.1)

## 2020-05-15 LAB — CBG MONITORING, ED: Glucose-Capillary: 152 mg/dL — ABNORMAL HIGH (ref 70–99)

## 2020-05-15 LAB — CBC
HCT: 45.6 % (ref 39.0–52.0)
Hemoglobin: 14.9 g/dL (ref 13.0–17.0)
MCH: 28.1 pg (ref 26.0–34.0)
MCHC: 32.7 g/dL (ref 30.0–36.0)
MCV: 85.9 fL (ref 80.0–100.0)
Platelets: 389 10*3/uL (ref 150–400)
RBC: 5.31 MIL/uL (ref 4.22–5.81)
RDW: 12.7 % (ref 11.5–15.5)
WBC: 8.9 10*3/uL (ref 4.0–10.5)
nRBC: 0 % (ref 0.0–0.2)

## 2020-05-15 LAB — LIPASE, BLOOD: Lipase: 31 U/L (ref 11–51)

## 2020-05-15 MED ORDER — ONDANSETRON HCL 4 MG/2ML IJ SOLN
4.0000 mg | Freq: Once | INTRAMUSCULAR | Status: DC
  Filled 2020-05-15: qty 2

## 2020-05-15 MED ORDER — AMLODIPINE BESYLATE 5 MG PO TABS: 5.0000 mg | ORAL_TABLET | Freq: Every day | ORAL | 0 refills | Status: DC

## 2020-05-15 MED ORDER — ONDANSETRON 4 MG PO TBDP
ORAL_TABLET | ORAL | Status: AC
  Filled 2020-05-15: qty 1

## 2020-05-15 MED ORDER — KETOROLAC TROMETHAMINE 30 MG/ML IJ SOLN
30.0000 mg | Freq: Once | INTRAMUSCULAR | Status: DC
  Administered 2020-05-15: 30 mg via INTRAVENOUS
  Filled 2020-05-15: qty 1

## 2020-05-15 MED ORDER — FAMOTIDINE IN NACL 20-0.9 MG/50ML-% IV SOLN
20.0000 mg | Freq: Once | INTRAVENOUS | Status: AC
  Administered 2020-05-15: 20 mg via INTRAVENOUS
  Filled 2020-05-15 (×2): qty 50

## 2020-05-15 MED ORDER — SODIUM CHLORIDE 0.9 % IV BOLUS
1000.0000 mL | Freq: Once | INTRAVENOUS | Status: AC
  Administered 2020-05-15: 1000 mL via INTRAVENOUS

## 2020-05-15 MED ORDER — PROMETHAZINE HCL 25 MG PO TABS: 25.0000 mg | ORAL_TABLET | Freq: Four times a day (QID) | ORAL | 0 refills | Status: DC | PRN

## 2020-05-15 MED ORDER — FAMOTIDINE 20 MG PO TABS: 20.0000 mg | ORAL_TABLET | Freq: Two times a day (BID) | ORAL | 0 refills | Status: DC

## 2020-05-15 MED ORDER — ONDANSETRON 4 MG PO TBDP
4.0000 mg | ORAL_TABLET | Freq: Once | ORAL | Status: AC
  Administered 2020-05-15: 4 mg via ORAL

## 2020-05-15 MED ORDER — METFORMIN HCL ER 500 MG PO TB24: 500.0000 mg | ORAL_TABLET | Freq: Two times a day (BID) | ORAL | 0 refills | Status: DC

## 2020-05-15 MED ORDER — HALOPERIDOL LACTATE 5 MG/ML IJ SOLN
5.0000 mg | Freq: Once | INTRAMUSCULAR | Status: AC
  Administered 2020-05-15: 5 mg via INTRAVENOUS
  Filled 2020-05-15: qty 1

## 2020-05-15 MED ORDER — METOCLOPRAMIDE HCL 5 MG/ML IJ SOLN
10.0000 mg | Freq: Once | INTRAMUSCULAR | Status: AC
  Administered 2020-05-15: 10 mg via INTRAVENOUS
  Filled 2020-05-15: qty 2

## 2020-05-15 NOTE — ED Notes (Signed)
Patient verbalizes understanding of discharge instructions. Prescriptions and follow-up care reviewed. Opportunity for questioning and answers were provided. Armband removed by staff, pt discharged from ED ambulatory.  

## 2020-05-15 NOTE — Discharge Instructions (Signed)

## 2020-05-15 NOTE — ED Triage Notes (Addendum)
Pt present vomiting and nausea. Symptoms started two weeks ago. Symptoms comes and goes for patient. One day he can be doing good the next day he vomiting. Pt is unable to keep food or anything down. Today he vomited over 10x times.  Pt states his abdominal is sore from all the vomiting.

## 2020-05-15 NOTE — ED Notes (Signed)
Has not taken his blood pressure pill for the past month.  I explained importance of compliance with taking his blood pressure medicine and that a high blood pressure can lead to a stroke.  He voices understanding.  States he did not realize it was that important to take his bp meds.

## 2020-05-15 NOTE — ED Provider Notes (Signed)
[facility]    CSN: [id number] Arrival date & time: 05/15/20  1534      History   Chief Complaint Chief Complaint  Patient presents with  . Emesis  . Nausea    HPI David [name] is a 40 y.o. male.   Patient presents with nausea and intractable vomiting today.  He reports > 10 episodes of emesis today.  He states he is unable to keep food or fluids down.  He reports intermittent vomiting over the past 2 weeks.  His abdomen is "sore" from vomiting so much.  He denies fever, chills, diarrhea, chest pain, shortness of breath, or other symptoms.  His medical history includes hypertension, diabetes, neuropathy, alcohol abuse, seizures.  The history is provided by the patient and medical records.    Past Medical History:  Diagnosis Date  . Diabetes mellitus (HCC)   . Metacarpal bone fracture 07/11/2015   right small  . Neuropathy   . Seizure with provoking factor [facility])    states seizure was due to alcohol     Patient Active Problem List   Diagnosis Date Noted  . Alcohol abuse 04/05/2016  . Hypertension 04/05/2016  . Type 2 diabetes mellitus without complication (HCC) 12/02/2013    Past Surgical History:  Procedure Laterality Date  . NO PAST SURGERIES         Home Medications    Prior to Admission medications   Medication Sig Start Date End Date Taking? Authorizing Provider  amLODipine (NORVASC) 5 MG tablet Take 1 tablet (5 mg total) by mouth daily. 10/18/19   Wallis Bamberg, PA-C  docusate sodium (COLACE) 100 MG capsule Take 1 capsule (100 mg total) by mouth every 12 (twelve) hours. 10/18/19   Wallis Bamberg, PA-C  gabapentin (NEURONTIN) 300 MG capsule Take 1 capsule (300 mg total) by mouth 3 (three) times daily. 04/18/17   Anders Simmonds, PA-C  insulin glargine (LANTUS) 100 UNIT/ML Solostar Pen 18 units bid is current dosin 10/19/19   Wallis Bamberg, PA-C  Insulin Pen Needle (PEN NEEDLES 31GX5/16") 31G X 8 MM MISC As directed 04/18/17   Anders Simmonds, PA-C   metFORMIN (GLUCOPHAGE-XR) 500 MG 24 hr tablet Take 1 tablet (500 mg total) by mouth 2 (two) times daily. 07/23/18   Ward, Chase Picket, PA-C  potassium chloride SA (K-DUR,KLOR-CON) 20 MEQ tablet Take 1 tablet (20 mEq total) by mouth daily. 04/19/17   Anders Simmonds, PA-C  sodium phosphate (FLEET) 7-19 GM/118ML ENEM Place 133 mLs (1 enema total) rectally daily as needed for severe constipation. 10/18/19   Wallis Bamberg, PA-C    Family History Family History  Problem Relation Age of Onset  . Diabetes Father     Social History Social History   Tobacco Use  . Smoking status: Current Every Day Smoker    Packs/day: 1.00    Years: 10.00    Pack years: 10.00    Types: Cigarettes  . Smokeless tobacco: Never Used  Substance Use Topics  . Alcohol use: Yes    Comment: one pint daily- sts stopped daily use a few months ago  . Drug use: No     Allergies   Patient has no known allergies.   Review of Systems Review of Systems  Constitutional: Negative for chills and fever.  HENT: Negative for ear pain and sore throat.   Eyes: Negative for pain and visual disturbance.  Respiratory: Negative for cough and shortness of breath.   Cardiovascular: Negative for chest pain and  palpitations.  Gastrointestinal: Positive for abdominal pain, nausea and vomiting. Negative for diarrhea.  Genitourinary: Negative for dysuria and hematuria.  Musculoskeletal: Negative for arthralgias and back pain.  Skin: Negative for color change and rash.  Neurological: Negative for seizures and syncope.  All other systems reviewed and are negative.    Physical Exam Triage Vital Signs ED Triage Vitals  Enc Vitals Group     BP      Pulse      Resp      Temp      Temp src      SpO2      Weight      Height      Head Circumference      Peak Flow      Pain Score      Pain Loc      Pain Edu?      Excl. in GC?    No data found.  Updated Vital Signs BP (!) 172/128 (BP Location: Left Arm)   Pulse 83    Temp 98.7 F (37.1 C) (Oral)   Resp 16   SpO2 98%   Visual Acuity Right Eye Distance:   Left Eye Distance:   Bilateral Distance:    Right Eye Near:   Left Eye Near:    Bilateral Near:     Physical Exam Vitals and nursing note reviewed.  Constitutional:      General: He is in acute distress.     Appearance: He is well-developed. He is ill-appearing.  HENT:     Head: Normocephalic and atraumatic.     Mouth/Throat:     Mouth: Mucous membranes are dry.  Eyes:     Conjunctiva/sclera: Conjunctivae normal.  Cardiovascular:     Rate and Rhythm: Normal rate and regular rhythm.     Heart sounds: Normal heart sounds.  Pulmonary:     Effort: Pulmonary effort is normal. No respiratory distress.     Breath sounds: Normal breath sounds.  Abdominal:     General: Bowel sounds are normal.     Palpations: Abdomen is soft.     Tenderness: There is no abdominal tenderness. There is no guarding or rebound.     Comments: Patient vomited twice during exam.  Musculoskeletal:     Cervical back: Neck supple.  Skin:    General: Skin is warm and dry.  Neurological:     General: No focal deficit present.     Mental Status: He is alert and oriented to person, place, and time.     Gait: Gait normal.  Psychiatric:        Mood and Affect: Mood normal.        Behavior: Behavior normal.      UC Treatments / Results  Labs (all labs ordered are listed, but only abnormal results are displayed) Labs Reviewed  CBG MONITORING, ED - Abnormal; Notable for the following components:      Result Value   Glucose-Capillary 152 (*)    All other components within normal limits    EKG   Radiology No results found.  Procedures Procedures (including critical care time)  Medications Ordered in UC Medications  ondansetron (ZOFRAN-ODT) disintegrating tablet 4 mg (4 mg Oral Given 05/15/20 1621)    Initial Impression / Assessment and Plan / UC Course  I have reviewed the triage vital signs and the  nursing notes.  Pertinent labs & imaging results that were available during my care of the patient were reviewed by  me and considered in my medical decision making (see chart for details).   Hypertensive urgency.  Blood pressure 172/128.  Intractable vomiting with nausea.  2 episodes of emesis during exam.  > 10 episodes of emesis today.  Sending patient to the ED for evaluation.      Final Clinical Impressions(s) / UC Diagnoses   Final diagnoses:  Hypertensive urgency  Intractable vomiting with nausea, unspecified vomiting type     Discharge Instructions     Go to the emergency department for evaluation of your intractable vomiting and hypertensive urgency.    Blood pressure 172/128.          ED Prescriptions    None     PDMP not reviewed this encounter.   Mickie Bail, NP 05/15/20 1623

## 2020-05-15 NOTE — Discharge Instructions (Addendum)
Go to the emergency department for evaluation of your intractable vomiting and hypertensive urgency.    Blood pressure 172/128.

## 2020-05-15 NOTE — ED Triage Notes (Signed)
Pt sent from [facility].  C/o nausea, vomiting, and high blood pressure x 2-3 weeks.  Denies abd pain.

## 2020-05-15 NOTE — ED Provider Notes (Signed)
[name] Provider Note   CSN: [id number] Arrival date & time: 05/15/20  1627     History Chief Complaint  Patient presents with  . Vomiting  . Hypertension    David [name] is a 40 y.o. male the past medical history of cyclic vomiting, diabetes and hypertension.  Patient is a daily smoker.  He states that he has not been taking any of his medications because he was "doing better."  He also does not have a primary care doctor.  Patient complains of daily vomiting for the past 2 weeks up to 10 episodes daily.  He states his been several days since has been able to hold anything down.  He vomits every 5 to 10 minutes.  He has pain in his abdominal wall from vomiting but denies any pain inside his belly.  He denies fevers, chills, diarrhea.  He states that he is not using alcohol or marijuana.  He states that he was just hoping this would go away however it lasted so long he decided come for help.  HPI     Past Medical History:  Diagnosis Date  . Diabetes mellitus (HCC)   . Metacarpal bone fracture 07/11/2015   right small  . Neuropathy   . 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 Alcohol abuse 04/05/2016  . Hypertension 04/05/2016  . Type 2 diabetes mellitus without complication (HCC) 12/02/2013    Past Surgical History:  Procedure Laterality Date  . NO PAST SURGERIES         Family History  Problem Relation Age of Onset  . Diabetes Father     Social History   Tobacco Use  . Smoking status: Current Every Day Smoker    Packs/day: 1.00    Years: 10.00    Pack years: 10.00    Types: Cigarettes  . Smokeless tobacco: Never Used  Substance Use Topics  . Alcohol use: Yes    Comment: one pint daily- sts stopped daily use a few months ago  . Drug use: No    Home Medications Prior to Admission medications   Medication Sig Start Date  End Date Taking? Authorizing Provider  promethazine (PHENERGAN) 25 MG tablet Take 1 tablet (25 mg total) by mouth every 6 (six) hours as needed for nausea or vomiting. 05/15/20  Yes Arran Fessel, PA-C  amLODipine (NORVASC) 5 MG tablet Take 1 tablet (5 mg total) by mouth daily. 05/15/20   Arthor Captain, PA-C  docusate sodium (COLACE) 100 MG capsule Take 1 capsule (100 mg total) by mouth every 12 (twelve) hours. 10/18/19   Wallis Bamberg, PA-C  famotidine (PEPCID) 20 MG tablet Take 1 tablet (20 mg total) by mouth 2 (two) times daily. 05/15/20   Arthor Captain, PA-C  gabapentin (NEURONTIN) 300 MG capsule Take 1 capsule (300 mg total) by mouth 3 (three) times daily. 04/18/17   Anders Simmonds, PA-C  insulin glargine (LANTUS) 100 UNIT/ML Solostar Pen 18 units bid is current dosin 10/19/19   Wallis Bamberg, PA-C  Insulin Pen Needle (PEN NEEDLES 31GX5/16") 31G X 8 MM MISC As directed 04/18/17   Anders Simmonds, PA-C  metFORMIN (GLUCOPHAGE-XR) 500 MG 24 hr tablet Take 1 tablet (500 mg total) by mouth 2 (two) times daily. 05/15/20   Dennis Hegeman, Cammy Copa, PA-C  potassium chloride SA (K-DUR,KLOR-CON) 20 MEQ tablet Take 1 tablet (20  mEq total) by mouth daily. 04/19/17   Anders Simmonds, PA-C  sodium phosphate (FLEET) 7-19 GM/118ML ENEM Place 133 mLs (1 enema total) rectally daily as needed for severe constipation. 10/18/19   Wallis Bamberg, PA-C    Allergies    Patient has no known allergies.  Review of Systems   Review of Systems Ten systems reviewed and are negative for acute change, except as noted in the HPI.   Physical Exam Updated Vital Signs BP (!) 151/110 (BP Location: Left Arm)   Pulse 78   Temp 100 F (37.8 C) (Oral)   Resp 20   SpO2 99%   Physical Exam Vitals and nursing note reviewed.  Constitutional:      General: He is not in acute distress.    Appearance: He is well-developed. He is not diaphoretic.     Comments: Patient vomited twice  HENT:     Head: Normocephalic and atraumatic.      Mouth/Throat:     Mouth: Mucous membranes are dry.  Eyes:     General: No scleral icterus.    Conjunctiva/sclera: Conjunctivae normal.  Cardiovascular:     Rate and Rhythm: Normal rate and regular rhythm.     Heart sounds: Normal heart sounds.  Pulmonary:     Effort: Pulmonary effort is normal. No respiratory distress.     Breath sounds: Normal breath sounds.  Abdominal:     Palpations: Abdomen is soft.     Tenderness: There is no abdominal tenderness.  Musculoskeletal:     Cervical back: Normal range of motion and neck supple.  Skin:    General: Skin is warm and dry.  Neurological:     Mental Status: He is alert.  Psychiatric:        Behavior: Behavior normal.     ED Results / Procedures / Treatments   Labs (all labs ordered are listed, but only abnormal results are displayed) Labs Reviewed  COMPREHENSIVE METABOLIC PANEL - Abnormal; Notable for the following components:      Result Value   Glucose, Bld 153 (*)    Alkaline Phosphatase 37 (*)    All other components within normal limits  LIPASE, BLOOD  CBC  URINALYSIS, ROUTINE W REFLEX MICROSCOPIC    EKG None  Radiology No results found.  Procedures Procedures   Medications Ordered in ED Medications  sodium chloride 0.9 % bolus 1,000 mL (0 mLs Intravenous Stopped 05/15/20 1847)  metoCLOPramide (REGLAN) injection 10 mg (10 mg Intravenous Given 05/15/20 1806)  famotidine (PEPCID) IVPB 20 mg premix (0 mg Intravenous Stopped 05/15/20 2002)  haloperidol lactate (HALDOL) injection 5 mg (5 mg Intravenous Given 05/15/20 2009)    ED Course  I have reviewed the triage vital signs and the nursing notes.  Pertinent labs & imaging results that were available during my care of the patient were reviewed by me and considered in my medical decision making (see chart for details).    MDM Rules/Calculators/A&P                         David [name] has presented with complaint of vomiting. I have reviewed the labs which  show CBC and LIPASE WNL, CMP showed glucose level of 153.  The differential diagnosis for David [name] is extensive and thus medical decision making is of high complexity. After review of the data points in the case the patient's presentation is most consistent with non-intractable vomiting- or potential gastroparesis. The presentation  is not consistent with emergent causes such as acute coronary syndrome, acute radiation syndrome, acute gastric dilatation, acetaminophen toxicity, adrenal insufficiency, appendicitis, salicylate toxicity, bowel obstruction or ileus, carbon monoxide poisoning, cholecystitis, digoxin toxicity, CNS tumor elevated intracranial pressures, outlet obstruction or gastric volvulus, incarcerated hernia, hyperemesis gravidarum, pancreatitis, peritonitis, ruptured viscus, testicular or ovarian torsion, or theophylline toxicity.  Similarly the presentation is not consistent with biliary colic, cannabinoid hyperemesis syndrome, chemotherapy reaction, gastroparesis, vertigo, migraine, motion sickness, narcotic withdrawal, peptic ulcer disease, renal colic or urinary tract infection.  Repeat examination of the patient shows a benign abdomen and the patient is tolerating p.o. fluids.   I discussed all of the findings with the patient and I have delivered strict return precautions personally or by detailed written instruction delivered verbally by nursing staff to the patient/caregiver/family member.   Data Reviewed/Counseling: I have reviewed the patient's vital signs, nursing notes, and other relevant tests/information. I had a detailed discussion regarding the historical points, exam findings, and any diagnostic results supporting the discharge diagnosis. I also discussed the need for outpatient follow-up and the need to return to the ED if symptoms worsen or if there are any questions or concerns that arise at home.  Final Clinical Impression(s) / ED Diagnoses Final diagnoses:   Non-intractable vomiting with nausea, unspecified vomiting type    Rx / DC Orders ED Discharge Orders         Ordered    metFORMIN (GLUCOPHAGE-XR) 500 MG 24 hr tablet  2 times daily        05/15/20 2150    amLODipine (NORVASC) 5 MG tablet  Daily        05/15/20 2150    promethazine (PHENERGAN) 25 MG tablet  Every 6 hours PRN        05/15/20 2150    famotidine (PEPCID) 20 MG tablet  2 times daily,   Status:  Discontinued        05/15/20 2150    famotidine (PEPCID) 20 MG tablet  2 times daily        05/15/20 2151           Arthor Captain, PA-C 05/15/20 2203    Derwood Kaplan, MD 05/16/20 1414

## 2020-06-14 ENCOUNTER — Ambulatory Visit (INDEPENDENT_AMBULATORY_CARE_PROVIDER_SITE_OTHER): Payer: Self-pay | Admitting: Primary Care

## 2020-06-14 ENCOUNTER — Other Ambulatory Visit: Payer: Self-pay

## 2020-06-14 DIAGNOSIS — E119 Type 2 diabetes mellitus without complications: Secondary | ICD-10-CM

## 2020-06-14 DIAGNOSIS — Z9189 Other specified personal risk factors, not elsewhere classified: Secondary | ICD-10-CM

## 2020-06-14 DIAGNOSIS — Z1159 Encounter for screening for other viral diseases: Secondary | ICD-10-CM

## 2020-06-14 DIAGNOSIS — I10 Essential (primary) hypertension: Secondary | ICD-10-CM

## 2020-06-14 DIAGNOSIS — Z7689 Persons encountering health services in other specified circumstances: Secondary | ICD-10-CM

## 2020-06-14 MED ORDER — AMLODIPINE BESYLATE 10 MG PO TABS: 10.0000 mg | ORAL_TABLET | Freq: Every day | ORAL | 3 refills | Status: DC

## 2020-06-14 MED ORDER — LISINOPRIL 20 MG PO TABS: 20.0000 mg | ORAL_TABLET | Freq: Every day | ORAL | 3 refills | Status: DC

## 2020-06-14 NOTE — Progress Notes (Signed)
New Patient Office Visit  Subjective:  Patient ID: David [name], male    DOB: 07/06/1980  Age: 40 y.o. MRN: [id number]  CC: Establishment care    HPI  Mr. David [name] is a 40 year old male who presents for establishment of care.  Recently seen in the emergency room on 05/15/20 for Non-intractable vomiting with nausea, unspecified vomiting type and hypertension.  Nausea and vomiting has resolved.  Blood pressure was elevated and treated with amlodipine 5 mg daily and glucose was 152 and placed on 500 mg of Metformin   Past Medical History:  Diagnosis Date  . Diabetes mellitus (HCC)   . Metacarpal bone fracture 07/11/2015   right small  . Neuropathy   . 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 NO PAST SURGERIES      Family History  Problem Relation Age of Onset  . Diabetes Father     Social History   Socioeconomic History  . Marital status: Married    Spouse name: Not on file  . Number of children: Not on file  . Years of education: Not on file  . Highest education level: Not on file  Occupational History  . Not on file  Tobacco Use  . Smoking status: Current Every Day Smoker    Packs/day: 1.00    Years: 10.00    Pack years: 10.00    Types: Cigarettes  . Smokeless tobacco: Never Used  Substance and Sexual Activity  . Alcohol use: Yes    Comment: one pint daily- sts stopped daily use a few months ago  . Drug use: No  . Sexual activity: Yes    Birth control/protection: Condom  Other Topics Concern  . Not on file  Social History Narrative  . Not on file   Social Determinants of Health   Financial Resource Strain: Not on file  Food Insecurity: Not on file  Transportation Needs: Not on file  Physical Activity: Not on file  Stress: Not on file  Social Connections: Not on file  Intimate Partner Violence: Not on file    ROS Review of Systems  All other  systems reviewed and are negative.   Objective:   Today's Vitals: There were no vitals taken for this visit.  Physical Exam Vitals reviewed.  Constitutional:      Appearance: He is normal weight.  HENT:     Head: Normocephalic.     Right Ear: Tympanic membrane normal.     Left Ear: Tympanic membrane normal.     Nose: Nose normal.  Eyes:     Extraocular Movements: Extraocular movements intact.     Pupils: Pupils are equal, round, and reactive to light.  Cardiovascular:     Rate and Rhythm: Normal rate and regular rhythm.  Pulmonary:     Effort: Pulmonary effort is normal.     Breath sounds: Normal breath sounds.  Abdominal:     General: Bowel sounds are normal.     Palpations: Abdomen is soft.  Musculoskeletal:        General: Normal range of motion.     Cervical back: Normal range of motion.  Skin:    General: Skin is warm and dry.  Neurological:     Mental Status: He is alert and oriented to person, place, and time.  Psychiatric:        Mood and Affect: Mood normal.  Behavior: Behavior normal.        Thought Content: Thought content normal.        Judgment: Judgment normal.     Assessment & Plan:  Diagnoses and all orders for this visit:  Primary hypertension Counseled on blood pressure goal of less than 130/80, low-sodium, DASH diet, medication compliance, 150 minutes of moderate intensity exercise per week. Discussed medication compliance, adverse effects. -     amLODipine (NORVASC) 10 MG tablet; Take 1 tablet (10 mg total) by mouth daily. -     lisinopril (ZESTRIL) 20 MG tablet; Take 1 tablet (20 mg total) by mouth daily.  Establishing care with new doctor, encounter for Establishing care with new provider  Encounter for hepatitis C virus screening test for high risk patient -     Hepatitis C Antibody  Type 2 diabetes mellitus without complication, without long-term current use of insulin (HCC) A1C 6.6 Goal of therapy: Less than 6.5 hemoglobin A1c.  Foods that are high in carbohydrates are the following rice, potatoes, breads, sugars, and pastas.  Reduction in the intake (eating) will assist in lowering your blood sugars. -     Lipid Panel -     Hemoglobin A1c    Outpatient Encounter Medications as of 06/14/2020  Medication Sig  . amLODipine (NORVASC) 5 MG tablet Take 1 tablet (5 mg total) by mouth daily.  Marland Kitchen docusate sodium (COLACE) 100 MG capsule Take 1 capsule (100 mg total) by mouth every 12 (twelve) hours.  . famotidine (PEPCID) 20 MG tablet Take 1 tablet (20 mg total) by mouth 2 (two) times daily.  Marland Kitchen gabapentin (NEURONTIN) 300 MG capsule Take 1 capsule (300 mg total) by mouth 3 (three) times daily.  . insulin glargine (LANTUS) 100 UNIT/ML Solostar Pen 18 units bid is current dosin  . Insulin Pen Needle (PEN NEEDLES 31GX5/16") 31G X 8 MM MISC As directed  . metFORMIN (GLUCOPHAGE-XR) 500 MG 24 hr tablet Take 1 tablet (500 mg total) by mouth 2 (two) times daily.  . potassium chloride SA (K-DUR,KLOR-CON) 20 MEQ tablet Take 1 tablet (20 mEq total) by mouth daily.  . promethazine (PHENERGAN) 25 MG tablet Take 1 tablet (25 mg total) by mouth every 6 (six) hours as needed for nausea or vomiting.  . sodium phosphate (FLEET) 7-19 GM/118ML ENEM Place 133 mLs (1 enema total) rectally daily as needed for severe constipation.   No facility-administered encounter medications on file as of 06/14/2020.    Follow-up: No follow-ups on file.   Grayce Sessions, NP  Assessment and Plan:    HPI 41 y.o.male presents for follow up from the hospital. Admit date to the hospital was 05/15/20, patient was discharged from the hospital on 05/15/20, patient was admitted for:   Past Medical History:  Diagnosis Date  . Diabetes mellitus (HCC)   . Metacarpal bone fracture 07/11/2015   right small  . Neuropathy   . 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 amLODipine (NORVASC) 5 MG tablet Take 1 tablet (5 mg total) by mouth daily. 90 tablet 0  . docusate sodium (COLACE) 100 MG capsule Take 1 capsule (100 mg total) by mouth every 12 (twelve) hours. 60 capsule 0  . famotidine (PEPCID) 20 MG tablet Take 1 tablet (20 mg total) by mouth 2 (two) times daily.  30 tablet 0  . gabapentin (NEURONTIN) 300 MG capsule Take 1 capsule (300 mg total) by mouth 3 (three) times daily. 90 capsule 3  . insulin glargine (LANTUS) 100 UNIT/ML Solostar Pen 18 units bid is current dosin 15 mL 0  . Insulin Pen Needle (PEN NEEDLES 31GX5/16") 31G X 8 MM MISC As directed 100 each 1  . metFORMIN (GLUCOPHAGE-XR) 500 MG 24 hr tablet Take 1 tablet (500 mg total) by mouth 2 (two) times daily. 60 tablet 0  . potassium chloride SA (K-DUR,KLOR-CON) 20 MEQ tablet Take 1 tablet (20 mEq total) by mouth daily. 7 tablet 0  . promethazine (PHENERGAN) 25 MG tablet Take 1 tablet (25 mg total) by mouth every 6 (six) hours as needed for nausea or vomiting. 12 tablet 0  . sodium phosphate (FLEET) 7-19 GM/118ML ENEM Place 133 mLs (1 enema total) rectally daily as needed for severe constipation. 230 mL 2   No current facility-administered medications on file prior to visit.    ROS: all negative except above.   Physical Exam: There were no vitals filed for this visit. There were no vitals taken for this visit. General Appearance: Well nourished, in no apparent distress. Eyes: PERRLA, EOMs, conjunctiva no swelling or erythema Sinuses: No Frontal/maxillary tenderness ENT/Mouth: Ext aud canals clear, TMs without erythema, bulging. No erythema, swelling, or exudate on post pharynx.  Tonsils not swollen or erythematous. Hearing normal.  Neck: Supple, thyroid normal.  Respiratory: Respiratory effort normal, BS equal bilaterally without rales, rhonchi, wheezing or stridor.  Cardio: RRR with no MRGs. Brisk peripheral pulses without edema.   Abdomen: Soft, + BS.  Non tender, no guarding, rebound, hernias, masses. Lymphatics: Non tender without lymphadenopathy.  Musculoskeletal: Full ROM, 5/5 strength, normal gait.  Skin: Warm, dry without rashes, lesions, ecchymosis.  Neuro: Cranial nerves intact. Normal muscle tone, no cerebellar symptoms. Sensation intact.  Psych: Awake and oriented X 3, normal affect, Insight and Judgment appropriate.    Diagnoses and all orders for this visit:  Primary hypertension Counseled on blood pressure goal of less than 130/80, low-sodium, DASH diet, medication compliance, 150 minutes of moderate intensity exercise per week. Discussed medication compliance, adverse effects. -     amLODipine (NORVASC) 10 MG tablet; Take 1 tablet (10 mg total) by mouth daily. -     lisinopril (ZESTRIL) 20 MG tablet; Take 1 tablet (20 mg total) by mouth daily.  Establishing care with new doctor, encounter for Establish care with new provider   Encounter for hepatitis C virus screening test for high risk patient -     Hepatitis C Antibody   Type 2 diabetes mellitus without complication, without long-term current use of insulin (HCC) ADA recommeGoal of therapy: Less than 6.5 hemoglobin A1c.  Reference clinical practice recommendations. Foods that are high in carbohydrates are the following rice, potatoes, breads, sugars, and pastas.  Reduction in the intake (eating) will assist in lowering your blood sugars. -     Lipid Panel -     Hemoglobin A1c    Grayce Sessions, NP 9:14 AM [facility]

## 2020-06-15 LAB — LIPID PANEL
Chol/HDL Ratio: 1.6 ratio (ref 0.0–5.0)
Cholesterol, Total: 146 mg/dL (ref 100–199)
HDL: 91 mg/dL
LDL Chol Calc (NIH): 43 mg/dL (ref 0–99)
Triglycerides: 56 mg/dL (ref 0–149)
VLDL Cholesterol Cal: 12 mg/dL (ref 5–40)

## 2020-06-15 LAB — HEMOGLOBIN A1C
Est. average glucose Bld gHb Est-mCnc: 143 mg/dL
Hgb A1c MFr Bld: 6.6 % — ABNORMAL HIGH (ref 4.8–5.6)

## 2020-06-15 LAB — HEPATITIS C ANTIBODY: Hep C Virus Ab: >11 s/co ratio — ABNORMAL HIGH (ref 0.0–0.9)

## 2020-06-16 ENCOUNTER — Other Ambulatory Visit (INDEPENDENT_AMBULATORY_CARE_PROVIDER_SITE_OTHER): Payer: Self-pay | Admitting: Primary Care

## 2020-06-16 DIAGNOSIS — R768 Other specified abnormal immunological findings in serum: Secondary | ICD-10-CM

## 2020-06-21 ENCOUNTER — Other Ambulatory Visit (HOSPITAL_COMMUNITY): Payer: Self-pay

## 2020-06-21 ENCOUNTER — Telehealth: Payer: Self-pay

## 2020-06-21 NOTE — Telephone Encounter (Signed)
[facility] Patient Advocate Encounter ? ?Insurance verification completed.   ? ?The patient is uninsured and will need patient assistance for medication. ? ?We can complete the application and will need to meet with the patient for signatures and income documentation. ? ?Carolle Ishii, CPhT ?Specialty Pharmacy Patient Advocate ?[facility] ?Phone: [phone] ?Fax:  [phone]  ?

## 2020-06-22 ENCOUNTER — Encounter: Payer: Self-pay | Admitting: Family

## 2020-07-08 ENCOUNTER — Encounter: Payer: Self-pay | Admitting: Internal Medicine

## 2020-07-08 NOTE — Progress Notes (Deleted)
[facility]  Reason for Consult: Chronic hepatitis C  Referring Provider: Gwinda Passe, NP   HPI:    David [name] is a 40 y.o. male who presents for initial evaluation and management of chronic hepatitis C.   Past medical history as below.  He was recently seen by PCP on 06/14/20 for establishment of care.  Screening HCV Ab was found to be positive.  No HCV RNA testing done to confirm chronic infection and he presents today for ID evaluation.  Patient tested positive April 2022.  Hepatitis C-associated risk factors present are: {hep c risks pos:13207}.  Patient denies {hep c risks neg:13208}.  Patient has had other studies performed.  Results: HCV Ab positive.  Patient has not had prior treatment for Hepatitis C.  Patient does not have a past history of liver disease.  Patient does not have a family history of liver disease.  Patient does not  have associated signs or symptoms related to liver disease.    Records reviewed from Epic.  Patient does not have documented immunity to Hepatitis A. Patient does not have documented immunity to Hepatitis B.     ***  Patient's Medications  New Prescriptions   No medications on file  Previous Medications   AMLODIPINE (NORVASC) 10 MG TABLET    Take 1 tablet (10 mg total) by mouth daily.   DOCUSATE SODIUM (COLACE) 100 MG CAPSULE    Take 1 capsule (100 mg total) by mouth every 12 (twelve) hours.   FAMOTIDINE (PEPCID) 20 MG TABLET    Take 1 tablet (20 mg total) by mouth 2 (two) times daily.   GABAPENTIN (NEURONTIN) 300 MG CAPSULE    Take 1 capsule (300 mg total) by mouth 3 (three) times daily.   INSULIN GLARGINE (LANTUS) 100 UNIT/ML SOLOSTAR PEN    18 units bid is current dosin   INSULIN PEN NEEDLE (PEN NEEDLES 31GX5/16") 31G X 8 MM MISC    As directed   LISINOPRIL (ZESTRIL) 20 MG TABLET    Take 1 tablet (20 mg total) by mouth daily.   METFORMIN (GLUCOPHAGE-XR) 500 MG 24 HR TABLET    Take 1  tablet (500 mg total) by mouth 2 (two) times daily.   POTASSIUM CHLORIDE SA (K-DUR,KLOR-CON) 20 MEQ TABLET    Take 1 tablet (20 mEq total) by mouth daily.   PROMETHAZINE (PHENERGAN) 25 MG TABLET    Take 1 tablet (25 mg total) by mouth every 6 (six) hours as needed for nausea or vomiting.   SODIUM PHOSPHATE (FLEET) 7-19 GM/118ML ENEM    Place 133 mLs (1 enema total) rectally daily as needed for severe constipation.  Modified Medications   No medications on file  Discontinued Medications   No medications on file      Past Medical History:  Diagnosis Date  . Diabetes mellitus (HCC)   . Metacarpal bone fracture 07/11/2015   right small  . Neuropathy   . Seizure with provoking factor (HCC)    states seizure was due to alcohol     Social History   Tobacco Use  . Smoking status: Current Every Day Smoker    Packs/day: 1.00    Years: 10.00    Pack years: 10.00    Types: Cigarettes  . Smokeless tobacco: Never Used  Substance Use Topics  . Alcohol use: Yes    Comment: one pint daily- sts stopped daily use a few months ago  . Drug use:  No    Family History  Problem Relation Age of Onset  . Diabetes Father     No Known Allergies  ROS    OBJECTIVE:    There were no vitals filed for this visit.   There is no height or weight on file to calculate BMI.  Physical Exam   Labs and Microbiology: ***  Genotype: No results found for: HCVGENOTYPE HCV viral load: No results found for: HCVQUANT Lab Results  Component Value Date   WBC 8.9 05/15/2020   HGB 14.9 05/15/2020   HCT 45.6 05/15/2020   MCV 85.9 05/15/2020   PLT 389 05/15/2020    Lab Results  Component Value Date   CREATININE 0.92 05/15/2020   BUN 6 05/15/2020   NA 136 05/15/2020   K 3.5 05/15/2020   CL 100 05/15/2020   CO2 26 05/15/2020    Lab Results  Component Value Date   ALT 11 05/15/2020   AST 17 05/15/2020   ALKPHOS 37 (L) 05/15/2020     Lab Results  Component Value Date   BILITOT 1.2 05/15/2020    ALBUMIN 4.3 05/15/2020    Labs and history reviewed and show FIB-4 score: 0.56.    Interpretation: Using a lower cutoff value of 1.45, a FIB-4 score <1.45 had a negative predictive value of 90% for advanced fibrosis (Ishak fibrosis score 4-6 which includes early bridging fibrosis to cirrhosis). In contrast, a FIB-4 >3.25 would have a 97% specificity and a positive predictive value of 65% for advanced fibrosis. In the patient cohort in which this formula was first validated, at least 70% patients had values <1.45 or >3.25. Authors argued that these individuals could potentially have avoided liver biopsy with an overall accuracy of 86%.     ASSESSMENT & PLAN:    No problem-specific Assessment & Plan notes found for this encounter.   No orders of the defined types were placed in this encounter.    There are no diagnoses linked to this encounter.   ***  Vedia Coffer for Infectious Disease [facility] 07/08/2020, 8:39 AM

## 2020-07-13 ENCOUNTER — Other Ambulatory Visit (INDEPENDENT_AMBULATORY_CARE_PROVIDER_SITE_OTHER): Payer: Self-pay | Admitting: Primary Care

## 2020-07-13 DIAGNOSIS — I10 Essential (primary) hypertension: Secondary | ICD-10-CM

## 2020-07-13 DIAGNOSIS — M792 Neuralgia and neuritis, unspecified: Secondary | ICD-10-CM

## 2020-07-13 DIAGNOSIS — R112 Nausea with vomiting, unspecified: Secondary | ICD-10-CM

## 2020-07-13 NOTE — Telephone Encounter (Signed)
Medication Refill - Medication: metFORMIN (GLUCOPHAGE-XR) 500 MG 24 hr tablet , amLODipine (NORVASC) 10 MG tablet . Caller states patient is in need of nausea  medication and does not know the name    Has the patient contacted their pharmacy? Yes.     (Agent: If yes, when and what did the pharmacy advise?)  Contact PCP due to no refills  Preferred Pharmacy (with phone number or street name):   [id number] - [place], [place] - [address] AT [facility] OF [address] Phone:  [phone]  Fax:  [phone]       Agent: Please be advised that RX refills may take up to 3 business days. We ask that you follow-up with your pharmacy.

## 2020-07-13 NOTE — Telephone Encounter (Signed)
Notes to clinic:  Medications were prescribed by a different provider  Review for refills Patient had appt 4 weeks ago   Requested Prescriptions  Pending Prescriptions Disp Refills   metFORMIN (GLUCOPHAGE-XR) 500 MG 24 hr tablet 60 tablet 0    Sig: Take 1 tablet (500 mg total) by mouth 2 (two) times daily.      Endocrinology:  Diabetes - Biguanides Passed - 07/13/2020 11:25 AM      Passed - Cr in normal range and within 360 days    Creat  Date Value Ref Range Status  04/05/2016 0.81 0.60 - 1.35 mg/dL Final   Creatinine, Ser  Date Value Ref Range Status  05/15/2020 0.92 0.61 - 1.24 mg/dL Final   Creatinine, Urine  Date Value Ref Range Status  04/05/2016 286 20 - 370 mg/dL Final          Passed - HBA1C is between 0 and 7.9 and within 180 days    Hgb A1c MFr Bld  Date Value Ref Range Status  06/14/2020 6.6 (H) 4.8 - 5.6 % Final    Comment:             Prediabetes: 5.7 - 6.4          Diabetes: >6.4          Glycemic control for adults with diabetes: <7.0           Passed - AA eGFR in normal range and within 360 days    GFR, Est African American  Date Value Ref Range Status  04/05/2016 >89 >=60 mL/min Final   GFR calc Af Amer  Date Value Ref Range Status  10/04/2019 >60 >60 mL/min Final   GFR, Est Non African American  Date Value Ref Range Status  04/05/2016 >89 >=60 mL/min Final   GFR, Estimated  Date Value Ref Range Status  05/15/2020 >60 >60 mL/min Final    Comment:    (NOTE) Calculated using the CKD-EPI Creatinine Equation (2021)           Passed - Valid encounter within last 6 months    Recent Outpatient Visits           4 weeks ago Primary hypertension   [facility] Kerin Perna, NP   3 years ago Type 2 diabetes mellitus without complication, without long-term current use of insulin [facility])   [place], [place], [place]   4 years ago Type 2 diabetes mellitus with complication,  with long-term current use of insulin ([place])   [place], [place] E, MD   4 years ago Type 2 diabetes mellitus without complication, with long-term current use of insulin ([place])   [place], Valerie A, MD   6 years ago Type 2 diabetes mellitus without complication   [place], Marlena Clipper, MD       Future Appointments             In 2 weeks Scot Jun, FNP [facility]               promethazine (PHENERGAN) 25 MG tablet 12 tablet 0    Sig: Take 1 tablet (25 mg total) by mouth every 6 (six) hours as needed for nausea or vomiting.      Not Delegated - Gastroenterology: Antiemetics Failed - 07/13/2020 11:25 AM  Failed - This refill cannot be delegated      Passed - Valid encounter within last 6 months    Recent Outpatient Visits           4 weeks ago Primary hypertension   [place] Kerin Perna, NP   3 years ago Type 2 diabetes mellitus without complication, without long-term current use of insulin [facility])   [place], [place], [place]   4 years ago Type 2 diabetes mellitus with complication, with long-term current use of insulin ([place])   [place], [place] E, MD   4 years ago Type 2 diabetes mellitus without complication, with long-term current use of insulin ([place])   [place], Valerie A, MD   6 years ago Type 2 diabetes mellitus without complication   [place] Tresa Garter, MD       Future Appointments             In 2 weeks Scot Jun, FNP [place]

## 2020-07-14 ENCOUNTER — Encounter: Payer: Self-pay | Admitting: Internal Medicine

## 2020-07-15 MED ORDER — PROMETHAZINE HCL 25 MG PO TABS: 25.0000 mg | ORAL_TABLET | Freq: Four times a day (QID) | ORAL | 0 refills | Status: DC | PRN

## 2020-07-15 MED ORDER — METFORMIN HCL ER 500 MG PO TB24: 500.0000 mg | ORAL_TABLET | Freq: Two times a day (BID) | ORAL | 0 refills | Status: DC

## 2020-07-27 ENCOUNTER — Ambulatory Visit (INDEPENDENT_AMBULATORY_CARE_PROVIDER_SITE_OTHER): Payer: Self-pay | Admitting: Family Medicine

## 2020-07-30 ENCOUNTER — Encounter (INDEPENDENT_AMBULATORY_CARE_PROVIDER_SITE_OTHER): Payer: Self-pay | Admitting: Family Medicine

## 2020-07-30 ENCOUNTER — Ambulatory Visit (INDEPENDENT_AMBULATORY_CARE_PROVIDER_SITE_OTHER): Payer: Self-pay | Admitting: Family Medicine

## 2020-07-30 ENCOUNTER — Other Ambulatory Visit: Payer: Self-pay

## 2020-07-30 VITALS — BP 131/99 | HR 79 | Temp 97.3°F | Ht 66.0 in | Wt 165.8 lb

## 2020-07-30 DIAGNOSIS — I10 Essential (primary) hypertension: Secondary | ICD-10-CM

## 2020-07-30 NOTE — Progress Notes (Signed)
Established Patient Office Visit  Subjective:  Patient ID: David [name], male    DOB: May 02, 1980  Age: 40 y.o. MRN: [id number]  CC:  Chief Complaint  Patient presents with  . Follow-up    HTN    HPI KEKAI GETER presents for blood pressure recheck. Patient seen in clinic 06/14/2020 as a new patient. BP management include lisinopril and amlodipine. He is only taking amlodipine. Doesn't check BP at home. BP was elevated initially on arrival improved with recheck. Past Medical History:  Diagnosis Date  . Diabetes mellitus (HCC)   . Metacarpal bone fracture 07/11/2015   right small  . Neuropathy   . 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 NO PAST SURGERIES      Family History  Problem Relation Age of Onset  . Diabetes Father     Social History   Socioeconomic History  . Marital status: Married    Spouse name: Not on file  . Number of children: Not on file  . Years of education: Not on file  . Highest education level: Not on file  Occupational History  . Not on file  Tobacco Use  . Smoking status: Current Every Day Smoker    Packs/day: 1.00    Years: 10.00    Pack years: 10.00    Types: Cigarettes  . Smokeless tobacco: Never Used  Substance and Sexual Activity  . Alcohol use: Yes    Comment: one pint daily- sts stopped daily use a few months ago  . Drug use: No  . Sexual activity: Yes    Birth control/protection: Condom  Other Topics Concern  . Not on file  Social History Narrative  . Not on file   Social Determinants of Health   Financial Resource Strain: Not on file  Food Insecurity: Not on file  Transportation Needs: Not on file  Physical Activity: Not on file  Stress: Not on file  Social Connections: Not on file  Intimate Partner Violence: Not on file    Outpatient Medications Prior to Visit  Medication Sig Dispense Refill  . amLODipine  (NORVASC) 10 MG tablet Take 1 tablet (10 mg total) by mouth daily. 90 tablet 3  . docusate sodium (COLACE) 100 MG capsule Take 1 capsule (100 mg total) by mouth every 12 (twelve) hours. 60 capsule 0  . famotidine (PEPCID) 20 MG tablet Take 1 tablet (20 mg total) by mouth 2 (two) times daily. 30 tablet 0  . gabapentin (NEURONTIN) 300 MG capsule Take 1 capsule (300 mg total) by mouth 3 (three) times daily. 90 capsule 3  . Insulin Pen Needle (PEN NEEDLES 31GX5/16") 31G X 8 MM MISC As directed 100 each 1  . lisinopril (ZESTRIL) 20 MG tablet Take 1 tablet (20 mg total) by mouth daily. 90 tablet 3  . metFORMIN (GLUCOPHAGE-XR) 500 MG 24 hr tablet Take 1 tablet (500 mg total) by mouth 2 (two) times daily. 60 tablet 0  . potassium chloride SA (K-DUR,KLOR-CON) 20 MEQ tablet Take 1 tablet (20 mEq total) by mouth daily. 7 tablet 0  . promethazine (PHENERGAN) 25 MG tablet Take 1 tablet (25 mg total) by mouth every 6 (six) hours as needed for nausea or vomiting. 12 tablet 0  . sodium phosphate (FLEET) 7-19 GM/118ML ENEM Place 133 mLs (1 enema total) rectally daily as needed for severe constipation. 230 mL 2  .  insulin glargine (LANTUS) 100 UNIT/ML Solostar Pen 18 units bid is current dosin 15 mL 0   No facility-administered medications prior to visit.    No Known Allergies  ROS Review of Systems Pertinent negatives listed in HPI   Objective:    Physical Exam BP (!) 131/99 (BP Location: Right Arm, Patient Position: Sitting, Cuff Size: Normal)   Pulse 79   Temp (!) 97.3 F (36.3 C) (Temporal)   Ht 5\' 6"  (1.676 m)   Wt 165 lb 12.8 oz (75.2 kg)   SpO2 97%   BMI 26.76 kg/m    Wt Readings from Last 3 Encounters:  07/30/20 165 lb 12.8 oz (75.2 kg)  10/04/19 130 lb (59 kg)  04/18/17 124 lb 12.8 oz (56.6 kg)   General appearance:Drowsy but responsive during visit (states no sleep last night)  cooperative Head: Normocephalic, without obvious abnormality, atraumatic Respiratory: Respirations even and  unlabored, normal respiratory rate Heart: Rate and rhythm normal. No gallop or murmurs noted on exam  Extremities: No gross deformities Skin: Skin color, texture, turgor normal. No rashes seen  Psych: Appropriate mood and affect. .     Health Maintenance Due  Topic Date Due  . PNEUMOCOCCAL POLYSACCHARIDE VACCINE AGE 32-64 HIGH RISK  Never done  . COVID-19 Vaccine (1) Never done  . FOOT EXAM  Never done  . OPHTHALMOLOGY EXAM  Never done  . HIV Screening  Never done    There are no preventive care reminders to display for this patient.  No results found for: TSH Lab Results  Component Value Date   WBC 8.9 05/15/2020   HGB 14.9 05/15/2020   HCT 45.6 05/15/2020   MCV 85.9 05/15/2020   PLT 389 05/15/2020   Lab Results  Component Value Date   NA 136 05/15/2020   K 3.5 05/15/2020   CO2 26 05/15/2020   GLUCOSE 153 (H) 05/15/2020   BUN 6 05/15/2020   CREATININE 0.92 05/15/2020   BILITOT 1.2 05/15/2020   ALKPHOS 37 (L) 05/15/2020   AST 17 05/15/2020   ALT 11 05/15/2020   PROT 7.8 05/15/2020   ALBUMIN 4.3 05/15/2020   CALCIUM 9.5 05/15/2020   ANIONGAP 10 05/15/2020   Lab Results  Component Value Date   CHOL 146 06/14/2020   Lab Results  Component Value Date   HDL 91 06/14/2020   Lab Results  Component Value Date   LDLCALC 43 06/14/2020   Lab Results  Component Value Date   TRIG 56 06/14/2020   Lab Results  Component Value Date   CHOLHDL 1.6 06/14/2020   Lab Results  Component Value Date   HGBA1C 6.6 (H) 06/14/2020      Assessment & Plan:  1. Primary hypertension -BP controlled on recheck BP reading -Will start Lisinopril and continue amlodipine     Follow-up: 3 months for hypertension and diabetes follow-up.

## 2020-08-15 ENCOUNTER — Other Ambulatory Visit: Payer: Self-pay

## 2020-08-15 ENCOUNTER — Encounter (HOSPITAL_COMMUNITY): Payer: Self-pay | Admitting: Pharmacy Technician

## 2020-08-15 ENCOUNTER — Inpatient Hospital Stay (HOSPITAL_COMMUNITY)
Admission: EM | Admit: 2020-08-15 | Discharge: 2020-08-16 | DRG: 683 | Disposition: A | Discharge status: Home / Self Care | Payer: Self-pay | Attending: Internal Medicine | Admitting: Internal Medicine

## 2020-08-15 ENCOUNTER — Emergency Department (HOSPITAL_COMMUNITY): Payer: Self-pay

## 2020-08-15 DIAGNOSIS — F1910 Other psychoactive substance abuse, uncomplicated: Secondary | ICD-10-CM | POA: Diagnosis present

## 2020-08-15 DIAGNOSIS — E1140 Type 2 diabetes mellitus with diabetic neuropathy, unspecified: Secondary | ICD-10-CM | POA: Diagnosis present

## 2020-08-15 DIAGNOSIS — E860 Dehydration: Secondary | ICD-10-CM | POA: Diagnosis present

## 2020-08-15 DIAGNOSIS — Z79899 Other long term (current) drug therapy: Secondary | ICD-10-CM

## 2020-08-15 DIAGNOSIS — I152 Hypertension secondary to endocrine disorders: Secondary | ICD-10-CM | POA: Diagnosis present

## 2020-08-15 DIAGNOSIS — R55 Syncope and collapse: Secondary | ICD-10-CM | POA: Diagnosis present

## 2020-08-15 DIAGNOSIS — E876 Hypokalemia: Secondary | ICD-10-CM | POA: Diagnosis present

## 2020-08-15 DIAGNOSIS — Z794 Long term (current) use of insulin: Secondary | ICD-10-CM

## 2020-08-15 DIAGNOSIS — Z833 Family history of diabetes mellitus: Secondary | ICD-10-CM

## 2020-08-15 DIAGNOSIS — N179 Acute kidney failure, unspecified: Principal | ICD-10-CM | POA: Diagnosis present

## 2020-08-15 DIAGNOSIS — F17210 Nicotine dependence, cigarettes, uncomplicated: Secondary | ICD-10-CM | POA: Diagnosis present

## 2020-08-15 DIAGNOSIS — E119 Type 2 diabetes mellitus without complications: Secondary | ICD-10-CM

## 2020-08-15 DIAGNOSIS — R112 Nausea with vomiting, unspecified: Secondary | ICD-10-CM

## 2020-08-15 DIAGNOSIS — Z20822 Contact with and (suspected) exposure to covid-19: Secondary | ICD-10-CM | POA: Diagnosis present

## 2020-08-15 DIAGNOSIS — F1123 Opioid dependence with withdrawal: Secondary | ICD-10-CM | POA: Diagnosis present

## 2020-08-15 DIAGNOSIS — I10 Essential (primary) hypertension: Secondary | ICD-10-CM | POA: Diagnosis present

## 2020-08-15 DIAGNOSIS — Z7984 Long term (current) use of oral hypoglycemic drugs: Secondary | ICD-10-CM

## 2020-08-15 LAB — CBC WITH DIFFERENTIAL/PLATELET
Abs Immature Granulocytes: 0.05 10*3/uL (ref 0.00–0.07)
Basophils Absolute: 0 10*3/uL (ref 0.0–0.1)
Basophils Relative: 0 %
Eosinophils Absolute: 0.1 10*3/uL (ref 0.0–0.5)
Eosinophils Relative: 0 %
HCT: 49.9 % (ref 39.0–52.0)
Hemoglobin: 16.9 g/dL (ref 13.0–17.0)
Immature Granulocytes: 0 %
Lymphocytes Relative: 26 %
Lymphs Abs: 3.6 10*3/uL (ref 0.7–4.0)
MCH: 28.9 pg (ref 26.0–34.0)
MCHC: 33.9 g/dL (ref 30.0–36.0)
MCV: 85.4 fL (ref 80.0–100.0)
Monocytes Absolute: 1.5 10*3/uL — ABNORMAL HIGH (ref 0.1–1.0)
Monocytes Relative: 11 %
Neutro Abs: 8.5 10*3/uL — ABNORMAL HIGH (ref 1.7–7.7)
Neutrophils Relative %: 63 %
Platelets: 394 10*3/uL (ref 150–400)
RBC: 5.84 MIL/uL — ABNORMAL HIGH (ref 4.22–5.81)
RDW: 13.2 % (ref 11.5–15.5)
WBC: 13.7 10*3/uL — ABNORMAL HIGH (ref 4.0–10.5)
nRBC: 0 % (ref 0.0–0.2)

## 2020-08-15 LAB — CBG MONITORING, ED: Glucose-Capillary: 127 mg/dL — ABNORMAL HIGH (ref 70–99)

## 2020-08-15 LAB — COMPREHENSIVE METABOLIC PANEL
ALT: 27 U/L (ref 0–44)
AST: 27 U/L (ref 15–41)
Albumin: 4 g/dL (ref 3.5–5.0)
Alkaline Phosphatase: 40 U/L (ref 38–126)
Anion gap: 13 (ref 5–15)
BUN: 35 mg/dL — ABNORMAL HIGH (ref 6–20)
CO2: 26 mmol/L (ref 22–32)
Calcium: 9.1 mg/dL (ref 8.9–10.3)
Chloride: 93 mmol/L — ABNORMAL LOW (ref 98–111)
Creatinine, Ser: 2.3 mg/dL — ABNORMAL HIGH (ref 0.61–1.24)
GFR, Estimated: 37 mL/min — ABNORMAL LOW (ref >60)
Glucose, Bld: 134 mg/dL — ABNORMAL HIGH (ref 70–99)
Potassium: 3.1 mmol/L — ABNORMAL LOW (ref 3.5–5.1)
Sodium: 132 mmol/L — ABNORMAL LOW (ref 135–145)
Total Bilirubin: 1.3 mg/dL — ABNORMAL HIGH (ref 0.3–1.2)
Total Protein: 7.2 g/dL (ref 6.5–8.1)

## 2020-08-15 LAB — RESP PANEL BY RT-PCR (FLU A&B, COVID) ARPGX2
Influenza A by PCR: NEGATIVE
Influenza B by PCR: NEGATIVE
SARS Coronavirus 2 by RT PCR: NEGATIVE

## 2020-08-15 LAB — LIPASE, BLOOD: Lipase: 108 U/L — ABNORMAL HIGH (ref 11–51)

## 2020-08-15 MED ORDER — ACETAMINOPHEN 650 MG RE SUPP: 650.0000 mg | Freq: Four times a day (QID) | RECTAL | Status: DC | PRN

## 2020-08-15 MED ORDER — ONDANSETRON HCL 4 MG/2ML IJ SOLN
4.0000 mg | Freq: Four times a day (QID) | INTRAMUSCULAR | Status: DC | PRN
  Administered 2020-08-16: 4 mg via INTRAVENOUS
  Filled 2020-08-15: qty 2

## 2020-08-15 MED ORDER — ONDANSETRON HCL 4 MG PO TABS: 4.0000 mg | ORAL_TABLET | Freq: Four times a day (QID) | ORAL | Status: DC | PRN

## 2020-08-15 MED ORDER — ENOXAPARIN SODIUM 40 MG/0.4ML IJ SOSY
40.0000 mg | PREFILLED_SYRINGE | INTRAMUSCULAR | Status: DC
  Administered 2020-08-16: 40 mg via SUBCUTANEOUS
  Filled 2020-08-15: qty 0.4

## 2020-08-15 MED ORDER — POTASSIUM CHLORIDE 10 MEQ/100ML IV SOLN
10.0000 meq | INTRAVENOUS | Status: AC
  Administered 2020-08-15 – 2020-08-16 (×3): 10 meq via INTRAVENOUS
  Filled 2020-08-15 (×3): qty 100

## 2020-08-15 MED ORDER — ONDANSETRON HCL 4 MG/2ML IJ SOLN
INTRAMUSCULAR | Status: AC
  Administered 2020-08-15: 4 mg via INTRAVENOUS
  Filled 2020-08-15: qty 2

## 2020-08-15 MED ORDER — ONDANSETRON HCL 4 MG/2ML IJ SOLN: 4.0000 mg | Freq: Once | INTRAMUSCULAR | Status: AC

## 2020-08-15 MED ORDER — LACTATED RINGERS IV SOLN: INTRAVENOUS | Status: DC

## 2020-08-15 MED ORDER — INSULIN ASPART 100 UNIT/ML IJ SOLN
0.0000 [IU] | INTRAMUSCULAR | Status: DC
  Administered 2020-08-16: 2 [IU] via SUBCUTANEOUS
  Administered 2020-08-16: 1 [IU] via SUBCUTANEOUS

## 2020-08-15 MED ORDER — SODIUM CHLORIDE 0.9 % IV BOLUS
1000.0000 mL | Freq: Once | INTRAVENOUS | Status: AC
  Administered 2020-08-15: 1000 mL via INTRAVENOUS

## 2020-08-15 MED ORDER — ACETAMINOPHEN 325 MG PO TABS: 650.0000 mg | ORAL_TABLET | Freq: Four times a day (QID) | ORAL | Status: DC | PRN

## 2020-08-15 NOTE — ED Triage Notes (Signed)
Pt bib ems after passing out earlier today. Reports not eating or drinking for several days. Given 300cc NS en route.  175 CBG 110/80 HR 106

## 2020-08-15 NOTE — ED Provider Notes (Signed)
[facility] EMERGENCY DEPARTMENT Provider Note   CSN: [id number] Arrival date & time: 08/15/20  1840     History No chief complaint on file.   David [name] is a 40 y.o. male past ministry of diabetes, alcohol use, hypertension who presents for evaluation of lightheadedness, nausea/vomiting.  He reports that for the last 4 days, he has not been able to keep any p.o.  He states he has had multiple episodes of nonbloody, nonbilious vomiting.  No diarrhea.  He states his abdomen hurts when he vomits but otherwise does not hurt.  He states that he takes Percocet twice a day.  He states he has not had it in 4 days.  No recent alcohol.  Denies any other marijuana, heroin use.  He states that today, he felt like he was going to pass out.  He states that there was 2 episodes where he felt lightheaded and felt like he was going to lose consciousness.  He states he never actually had a syncopal episode but laid down to make the feeling passed.  He denies any chest pain, difficulty breathing, diarrhea.  The history is provided by the patient.      Past Medical History:  Diagnosis Date   Diabetes mellitus (HCC)    Metacarpal bone fracture 07/11/2015   right small   Neuropathy    Seizure with provoking factor (HCC)    states seizure was due to alcohol     Patient Active Problem List   Diagnosis Date Noted   Syncope 08/15/2020   AKI (acute kidney injury) (HCC) 08/15/2020   Opioid dependence with withdrawal (HCC) 08/15/2020   Alcohol abuse 04/05/2016   Hypertension 04/05/2016   Type 2 diabetes mellitus without complication (HCC) 12/02/2013    Past Surgical History:  Procedure Laterality Date   NO PAST SURGERIES         Family History  Problem Relation Age of Onset   Diabetes Father     Social History   Tobacco Use   Smoking status: Every Day    Packs/day: 1.00    Years: 10.00    Pack years: 10.00    Types: Cigarettes   Smokeless tobacco: Never   Substance Use Topics   Alcohol use: Yes    Comment: one pint daily- sts stopped daily use a few months ago   Drug use: Yes    Types: Oxycodone    Home Medications Prior to Admission medications   Medication Sig Start Date End Date Taking? Authorizing Provider  amLODipine (NORVASC) 10 MG tablet Take 1 tablet (10 mg total) by mouth daily. 06/14/20   Grayce Sessions, NP  docusate sodium (COLACE) 100 MG capsule Take 1 capsule (100 mg total) by mouth every 12 (twelve) hours. 10/18/19   Wallis Bamberg, PA-C  famotidine (PEPCID) 20 MG tablet Take 1 tablet (20 mg total) by mouth 2 (two) times daily. 05/15/20   Arthor Captain, PA-C  gabapentin (NEURONTIN) 300 MG capsule Take 1 capsule (300 mg total) by mouth 3 (three) times daily. 04/18/17   Anders Simmonds, PA-C  insulin glargine (LANTUS) 100 UNIT/ML Solostar Pen 18 units bid is current dosin 10/19/19   Wallis Bamberg, PA-C  Insulin Pen Needle (PEN NEEDLES 31GX5/16") 31G X 8 MM MISC As directed 04/18/17   Anders Simmonds, PA-C  lisinopril (ZESTRIL) 20 MG tablet Take 1 tablet (20 mg total) by mouth daily. 06/14/20   Grayce Sessions, NP  metFORMIN (GLUCOPHAGE-XR) 500 MG 24  hr tablet Take 1 tablet (500 mg total) by mouth 2 (two) times daily. 07/15/20   Grayce Sessions, NP  potassium chloride SA (K-DUR,KLOR-CON) 20 MEQ tablet Take 1 tablet (20 mEq total) by mouth daily. 04/19/17   Anders Simmonds, PA-C  promethazine (PHENERGAN) 25 MG tablet Take 1 tablet (25 mg total) by mouth every 6 (six) hours as needed for nausea or vomiting. 07/15/20   Grayce Sessions, NP  sodium phosphate (FLEET) 7-19 GM/118ML ENEM Place 133 mLs (1 enema total) rectally daily as needed for severe constipation. 10/18/19   Wallis Bamberg, PA-C    Allergies    Patient has no known allergies.  Review of Systems   Review of Systems  Constitutional:  Negative for fever.  Respiratory:  Negative for cough and shortness of breath.   Cardiovascular:  Negative for chest pain.   Gastrointestinal:  Positive for abdominal pain, nausea and vomiting.  Genitourinary:  Negative for dysuria and hematuria.  Neurological:  Positive for light-headedness. Negative for headaches.  All other systems reviewed and are negative.  Physical Exam Updated Vital Signs BP (!) 119/92 (BP Location: Right Arm)   Pulse 90   Temp 98.5 F (36.9 C) (Oral)   Resp 10   SpO2 100%   Physical Exam Vitals and nursing note reviewed.  Constitutional:      Appearance: Normal appearance. He is well-developed.     Comments: Actively vomiting  HENT:     Head: Normocephalic and atraumatic.  Eyes:     General: Lids are normal.     Conjunctiva/sclera: Conjunctivae normal.     Pupils: Pupils are equal, round, and reactive to light.  Cardiovascular:     Rate and Rhythm: Normal rate and regular rhythm.     Pulses: Normal pulses.     Heart sounds: Normal heart sounds. No murmur heard.   No friction rub. No gallop.  Pulmonary:     Effort: Pulmonary effort is normal.     Breath sounds: Normal breath sounds.     Comments: Lungs clear to auscultation bilaterally.  Symmetric chest rise.  No wheezing, rales, rhonchi. Abdominal:     Palpations: Abdomen is soft. Abdomen is not rigid.     Tenderness: There is generalized abdominal tenderness. There is no guarding.     Comments: Mild generalized tenderness with no focal point.  No rigidity, guarding.  No CVA tenderness noted bilaterally.  Musculoskeletal:        General: Normal range of motion.     Cervical back: Full passive range of motion without pain.  Skin:    General: Skin is warm and dry.     Capillary Refill: Capillary refill takes less than 2 seconds.  Neurological:     Mental Status: He is alert and oriented to person, place, and time.  Psychiatric:        Speech: Speech normal.    ED Results / Procedures / Treatments   Labs (all labs ordered are listed, but only abnormal results are displayed) Labs Reviewed  CBC WITH  DIFFERENTIAL/PLATELET - Abnormal; Notable for the following components:      Result Value   WBC 13.7 (*)    RBC 5.84 (*)    Neutro Abs 8.5 (*)    Monocytes Absolute 1.5 (*)    All other components within normal limits  COMPREHENSIVE METABOLIC PANEL - Abnormal; Notable for the following components:   Sodium 132 (*)    Potassium 3.1 (*)    Chloride 93 (*)  Glucose, Bld 134 (*)    BUN 35 (*)    Creatinine, Ser 2.30 (*)    Total Bilirubin 1.3 (*)    GFR, Estimated 37 (*)    All other components within normal limits  LIPASE, BLOOD - Abnormal; Notable for the following components:   Lipase 108 (*)    All other components within normal limits  RESP PANEL BY RT-PCR (FLU A&B, COVID) ARPGX2  URINALYSIS, ROUTINE W REFLEX MICROSCOPIC  RAPID URINE DRUG SCREEN, HOSP PERFORMED  HIV ANTIBODY (ROUTINE TESTING W REFLEX)  CBC  BASIC METABOLIC PANEL  HEMOGLOBIN A1C    EKG EKG Interpretation  Date/Time:  Sunday August 15 2020 18:44:30 EDT Ventricular Rate:  110 PR Interval:  126 QRS Duration: 84 QT Interval:  376 QTC Calculation: 508 R Axis:   -1 Text Interpretation: Sinus tachycardia Biatrial enlargement Nonspecific ST abnormality Abnormal ECG Since last tracing rate faster atrial enlargement is new Confirmed by Haviland, Julie ([id number]) on 08/15/2020 9:45:10 PM  Radiology CT Abdomen Pelvis Wo Contrast  Result Date: 08/15/2020 CLINICAL DATA:  Abdominal pain, vomiting EXAM: CT ABDOMEN AND PELVIS WITHOUT CONTRAST TECHNIQUE: Multidetector CT imaging of the abdomen and pelvis was performed following the standard protocol without IV contrast. COMPARISON:  None. FINDINGS: Lower chest: Lung bases are clear. No effusions. Heart is normal size. Hepatobiliary: No focal hepatic abnormality. Gallbladder unremarkable. Pancreas: No focal abnormality or ductal dilatation. Spleen: No focal abnormality.  Normal size. Adrenals/Urinary Tract: No adrenal abnormality. No focal renal abnormality. No stones or  hydronephrosis. Urinary bladder is unremarkable. Stomach/Bowel: Normal appendix. Stomach, large and small bowel grossly unremarkable. Vascular/Lymphatic: No evidence of aneurysm or adenopathy. Reproductive: No visible focal abnormality. Other: No free fluid or free air. Musculoskeletal: No acute bony abnormality. IMPRESSION: No acute findings in the abdomen or pelvis. Electronically Signed   By: Kevin  Dover M.D.   On: 08/15/2020 21:48    Procedures Procedures   Medications Ordered in ED Medications  lactated ringers infusion ( Intravenous New Bag/Given 08/15/20 2219)  potassium chloride 10 mEq in 100 mL IVPB (has no administration in time range)  enoxaparin (LOVENOX) injection 40 mg (has no administration in time range)  acetaminophen (TYLENOL) tablet 650 mg (has no administration in time range)    Or  acetaminophen (TYLENOL) suppository 650 mg (has no administration in time range)  ondansetron (ZOFRAN) tablet 4 mg (has no administration in time range)    Or  ondansetron (ZOFRAN) injection 4 mg (has no administration in time range)  insulin aspart (novoLOG) injection 0-9 Units (has no administration in time range)  ondansetron (ZOFRAN) injection 4 mg (4 mg Intravenous Given 08/15/20 2149)  sodium chloride 0.9 % bolus 1,000 mL (1,000 mLs Intravenous New Bag/Given 08/15/20 2216)    ED Course  I have reviewed the triage vital signs and the nursing notes.  Pertinent labs & imaging results that were available during my care of the patient were reviewed by me and considered in my medical decision making (see chart for details).    MDM Rules/Calculators/A&P                          39  year old male who presents for evaluation of nausea/vomiting x4 days.  He also reports lightheadedness, near syncope today.  He states he has not been able to keep anything down over the last 4 days.  No fevers.  Denies any blood in vomit, diarrhea.  On arrival, he is afebrile, blood pressure is stable.  Vital  stable.  On exam, he is actively vomiting.  He has some mild generalized tenderness that he describes as some tightness after vomiting.  Question if this is dehydration from nausea/vomiting versus viral GI illness.  Abdominal exam not concerning for appendicitis, diverticulitis.  Also consider withdrawal since he states he has been taking Percocet twice daily for about a month and recently stopped 5 days ago.  Lipase elevated at 108.  CBC shows leukocytosis of 13.7.  CMP shows sodium of 3.1, BUN of 35, creatinine of 2.30.  This is new for him.  He had lab work done on 05/15/2020 which showed a BUN of 6, creatinine of 0.92. Bicarb is 26. Anion gap is 13. Work up non consistent with DKA.   CT abdomen pelvis shows no acute findings.  Do not feel that this represents obstructive pathology given reassuring CT scan.  Suspect his AKI is likely from volume depletion from nausea/vomiting.  We will plan for admission given AKI.  Discussed patient with Dr. Julian Reil (hospitalist) who accepts patient for admission.   Portions of this note were generated with Scientist, clinical (histocompatibility and immunogenetics). Dictation errors may occur despite best attempts at proofreading.  Final Clinical Impression(s) / ED Diagnoses Final diagnoses:  Non-intractable vomiting with nausea, unspecified vomiting type  AKI (acute kidney injury) [facility])    Rx / DC Orders ED Discharge Orders     None        Rosana Hoes 08/15/20 2227    Jacalyn Lefevre, MD 08/18/20 [phone]

## 2020-08-15 NOTE — ED Notes (Signed)
David [name], Wife, [phone], would like call when pt is moved to a bed upstairs.

## 2020-08-15 NOTE — ED Provider Notes (Signed)
Emergency Medicine Provider Triage Evaluation Note  David [name] , a 40 y.o. male  was evaluated in triage.  Pt complains of emesis and near syncope. Emesis and diarrhea x 3 days. Feels dehydrated. He is detoxing from Opiates, last use 4 days ago. NBNB emesis. No melena or BRBPR. Abd pain with emesis however resolved if not throwing up. No dysuria. Denis etoh use, chronic NSAIDS  Review of Systems  Positive: Emesis, diarrhea, abd pain, near syncope Negative: Syncope,  Physical Exam  BP 92/72 (BP Location: Right Arm)   Pulse 80   Temp 99.8 F (37.7 C)   Resp 18   SpO2 98%  Gen:   Awake, no distress   Resp:  Normal effort  ABD:  Soft non tender MSK:   Moves extremities without difficulty  Other:    Medical Decision Making  Medically screening exam initiated at 6:50 PM.  Appropriate orders placed.  Dian BOSTYN BOGIE was informed that the remainder of the evaluation will be completed by another provider, this initial triage assessment does not replace that evaluation, and the importance of remaining in the ED until their evaluation is complete.  Emesis, diarrhea, near syncope   Alannah Averhart A, PA-C 08/15/20 Violeta Gelinas, MD 08/15/20 2053

## 2020-08-15 NOTE — H&P (Signed)
History and Physical    RECO SHONK CBJ:[id number] DOB: 02-Mar-1981 DOA: 08/15/2020  PCP: Grayce Sessions, NP  Patient coming from: Home  I have personally briefly reviewed patient's old medical records in [facility]  Chief Complaint: N/V, syncope  HPI: David [name] is a 40 y.o. male with medical history significant of DM2, HTN, EtOH use in past (none recently), smoking, percocet abuse.  Pt presents to ED with c/o N/V, lightheadedness.  Pt has history of episodic percocet use.  Had been using "for a couple of weeks".  Quit using cold [place] about 4 days ago.  After he stopped using he developed NBNB emesis.  Says he has had similar symptoms happen before when he stopped using percocet in the past.  No marijuana nor heroin use.  Today felt like he was going to pass out and so presents to ED.  No CP, SOB, nor diarrhea.   ED Course: Creat 2.3, BUN 35, lipase 108, K 3.1, sodium 132, WBC 13.7k.  CT AP w/o contrast - no acute findings.  Covid neg  Given IVF, hospitalist asked to admit for AKI.   Review of Systems: As per HPI, otherwise all review of systems negative.  Past Medical History:  Diagnosis Date   Diabetes mellitus (HCC)    Metacarpal bone fracture 07/11/2015   right small   Neuropathy    Seizure with provoking factor (HCC)    states seizure was due to alcohol     Past Surgical History:  Procedure Laterality Date   NO PAST SURGERIES       reports that he has been smoking cigarettes. He has a 10.00 pack-year smoking history. He has never used smokeless tobacco. He reports current alcohol use. He reports current drug use. Drug: Oxycodone.  No Known Allergies  Family History  Problem Relation Age of Onset   Diabetes Father      Prior to Admission medications   Medication Sig Start Date End Date Taking? Authorizing Provider  amLODipine (NORVASC) 10 MG tablet Take 1 tablet (10 mg total) by mouth daily. 06/14/20  Yes Grayce Sessions,  NP  lisinopril (ZESTRIL) 20 MG tablet Take 1 tablet (20 mg total) by mouth daily. 06/14/20  Yes Grayce Sessions, NP  metFORMIN (GLUCOPHAGE-XR) 500 MG 24 hr tablet Take 1 tablet (500 mg total) by mouth 2 (two) times daily. 07/15/20  Yes Grayce Sessions, NP  promethazine (PHENERGAN) 25 MG tablet Take 1 tablet (25 mg total) by mouth every 6 (six) hours as needed for nausea or vomiting. 07/15/20  Yes Grayce Sessions, NP  docusate sodium (COLACE) 100 MG capsule Take 1 capsule (100 mg total) by mouth every 12 (twelve) hours. Patient not taking: No sig reported 10/18/19   Wallis Bamberg, PA-C  famotidine (PEPCID) 20 MG tablet Take 1 tablet (20 mg total) by mouth 2 (two) times daily. Patient not taking: No sig reported 05/15/20   Arthor Captain, PA-C  gabapentin (NEURONTIN) 300 MG capsule Take 1 capsule (300 mg total) by mouth 3 (three) times daily. Patient not taking: No sig reported 04/18/17   Anders Simmonds, PA-C  insulin glargine (LANTUS) 100 UNIT/ML Solostar Pen 18 units bid is current dosin Patient not taking: No sig reported 10/19/19   Wallis Bamberg, PA-C  Insulin Pen Needle (PEN NEEDLES 31GX5/16") 31G X 8 MM MISC As directed Patient not taking: No sig reported 04/18/17   Anders Simmonds, PA-C  potassium chloride SA (K-DUR,KLOR-CON) 20 MEQ tablet Take  1 tablet (20 mEq total) by mouth daily. Patient not taking: No sig reported 04/19/17   Anders Simmonds, PA-C  sodium phosphate (FLEET) 7-19 GM/118ML ENEM Place 133 mLs (1 enema total) rectally daily as needed for severe constipation. Patient not taking: No sig reported 10/18/19   Wallis Bamberg, PA-C    Physical Exam: Vitals:   08/15/20 1842 08/15/20 2130  BP: 92/72 (!) 119/92  Pulse: 80 90  Resp: 18 10  Temp: 99.8 F (37.7 C) 98.5 F (36.9 C)  TempSrc:  Oral  SpO2: 98% 100%    Constitutional: NAD, calm, ill appearing Eyes: PERRL, lids and conjunctivae normal ENMT: Mucous membranes are moist. Posterior pharynx clear of any exudate or  lesions.Normal dentition.  Neck: normal, supple, no masses, no thyromegaly Respiratory: clear to auscultation bilaterally, no wheezing, no crackles. Normal respiratory effort. No accessory muscle use.  Cardiovascular: Regular rate and rhythm, no murmurs / rubs / gallops. No extremity edema. 2+ pedal pulses. No carotid bruits.  Abdomen: no tenderness, no masses palpated. No hepatosplenomegaly. Bowel sounds positive.  Musculoskeletal: no clubbing / cyanosis. No joint deformity upper and lower extremities. Good ROM, no contractures. Normal muscle tone.  Skin: no rashes, lesions, ulcers. No induration Neurologic: CN 2-12 grossly intact. Sensation intact, DTR normal. Strength 5/5 in all 4.  Psychiatric: Normal judgment and insight. Alert and oriented x 3. Normal mood.    Labs on Admission: I have personally reviewed following labs and imaging studies  CBC: Recent Labs  Lab 08/15/20 1851  WBC 13.7*  NEUTROABS 8.5*  HGB 16.9  HCT 49.9  MCV 85.4  PLT 394   Basic Metabolic Panel: Recent Labs  Lab 08/15/20 1851  NA 132*  K 3.1*  CL 93*  CO2 26  GLUCOSE 134*  BUN 35*  CREATININE 2.30*  CALCIUM 9.1   GFR: CrCl cannot be calculated (Unknown ideal weight.). Liver Function Tests: Recent Labs  Lab 08/15/20 1851  AST 27  ALT 27  ALKPHOS 40  BILITOT 1.3*  PROT 7.2  ALBUMIN 4.0   Recent Labs  Lab 08/15/20 1851  LIPASE 108*   No results for input(s): AMMONIA in the last 168 hours. Coagulation Profile: No results for input(s): INR, PROTIME in the last 168 hours. Cardiac Enzymes: No results for input(s): CKTOTAL, CKMB, CKMBINDEX, TROPONINI in the last 168 hours. BNP (last 3 results) No results for input(s): PROBNP in the last 8760 hours. HbA1C: No results for input(s): HGBA1C in the last 72 hours. CBG: No results for input(s): GLUCAP in the last 168 hours. Lipid Profile: No results for input(s): CHOL, HDL, LDLCALC, TRIG, CHOLHDL, LDLDIRECT in the last 72 hours. Thyroid  Function Tests: No results for input(s): TSH, T4TOTAL, FREET4, T3FREE, THYROIDAB in the last 72 hours. Anemia Panel: No results for input(s): VITAMINB12, FOLATE, FERRITIN, TIBC, IRON, RETICCTPCT in the last 72 hours. Urine analysis:    Component Value Date/Time   COLORURINE YELLOW 07/23/2018 1043   APPEARANCEUR CLEAR 07/23/2018 1043   LABSPEC 1.023 07/23/2018 1043   PHURINE 6.0 07/23/2018 1043   GLUCOSEU NEGATIVE 07/23/2018 1043   HGBUR NEGATIVE 07/23/2018 1043   BILIRUBINUR NEGATIVE 07/23/2018 1043   BILIRUBINUR negative 07/28/2015 1438   BILIRUBINUR Small 06/15/2014 1841   KETONESUR NEGATIVE 07/23/2018 1043   PROTEINUR NEGATIVE 07/23/2018 1043   UROBILINOGEN 0.2 07/28/2015 1438   UROBILINOGEN 0.2 09/23/2014 1009   NITRITE NEGATIVE 07/23/2018 1043   LEUKOCYTESUR NEGATIVE 07/23/2018 1043    Radiological Exams on Admission: CT Abdomen Pelvis Wo Contrast  Result Date: 08/15/2020 CLINICAL DATA:  Abdominal pain, vomiting EXAM: CT ABDOMEN AND PELVIS WITHOUT CONTRAST TECHNIQUE: Multidetector CT imaging of the abdomen and pelvis was performed following the standard protocol without IV contrast. COMPARISON:  None. FINDINGS: Lower chest: Lung bases are clear. No effusions. Heart is normal size. Hepatobiliary: No focal hepatic abnormality. Gallbladder unremarkable. Pancreas: No focal abnormality or ductal dilatation. Spleen: No focal abnormality.  Normal size. Adrenals/Urinary Tract: No adrenal abnormality. No focal renal abnormality. No stones or hydronephrosis. Urinary bladder is unremarkable. Stomach/Bowel: Normal appendix. Stomach, large and small bowel grossly unremarkable. Vascular/Lymphatic: No evidence of aneurysm or adenopathy. Reproductive: No visible focal abnormality. Other: No free fluid or free air. Musculoskeletal: No acute bony abnormality. IMPRESSION: No acute findings in the abdomen or pelvis. Electronically Signed   By: Charlett Nose M.D.   On: 08/15/2020 21:48    EKG:  Independently reviewed.  Assessment/Plan Principal Problem:   AKI (acute kidney injury) (HCC) Active Problems:   Type 2 diabetes mellitus without complication (HCC)   Hypertension   Near syncope   Opioid dependence with withdrawal (HCC)    AKI - Suspect dehydration in setting of N/V which in turn may be due to opiate withdrawal given history, has had similar in past. IVF Strict intake and output Repeat BMP in AM N/V - Suspect opiate withdrawal most likely given HPI Alternatively could be gastroenteritis CT AP unimpressive Supportive care with IVF and nausea meds Clear liquids when able to tolerate Opiate dependence with withdrawal - Offered Buprenorphine, but pt wants to try opiate free at this point. Supportive care as above H/o HTN - BP on soft side and has AKI Holding home BP meds for the moment Near syncope - Tele monitor 2d echo Hypokalemia - Replace K DM2 - Hold metformin Sensitive SSI Q4H  DVT prophylaxis: Lovenox Code Status: Full Family Communication: Wife  at bedside, pt was okay discussing care in front of Wife Disposition Plan: Home after AKI resolved and taking POs Consults called: None Admission status: Admit to inpatient  Severity of Illness: The appropriate patient status for this patient is INPATIENT. Inpatient status is judged to be reasonable and necessary in order to provide the required intensity of service to ensure the patient's safety. The patient's presenting symptoms, physical exam findings, and initial radiographic and laboratory data in the context of their chronic comorbidities is felt to place them at high risk for further clinical deterioration. Furthermore, it is not anticipated that the patient will be medically stable for discharge from the hospital within 2 midnights of admission. The following factors support the patient status of inpatient.   Patient has acute kidney injury.  Patient has one of the following: Increase in Serum  Creatinine >0.3 mg/dL within 38H Increase in Serum Creatinine > 1.5 times baseline known or presumed to have been within the last 7 days Urine volume < 0.5 ml/kg/hr for 6 hours    * I certify that at the point of admission it is my clinical judgment that the patient will require inpatient hospital care spanning beyond 2 midnights from the point of admission due to high intensity of service, high risk for further deterioration and high frequency of surveillance required.*   Keelan Pomerleau M. DO [facility]  How to contact the [facility] Attending or Consulting provider 7A - 7P or covering provider during after hours 7P -7A, for this patient?  Check the care team in [facility] and look for a) attending/consulting [facility] provider listed and b) the [facility] team  listed Log into [email or web address]  [employer] Physician Scheduling and messaging for groups and whole hospitals  On call and physician scheduling software for group practices, residents, hospitalists and other medical providers for call, clinic, rotation and shift schedules. On[employer] is a hospital-wide system for scheduling doctors and paging doctors on call. EasyPlot is for scientific plotting and data analysis.  [email or web address]  and use [place]'s universal password to access. If you do not have the password, please contact the hospital operator.  Locate the [facility] provider you are looking for under [facility] and page to a number that you can be directly reached. If you still have difficulty reaching the provider, please page the [facility] (Director on Call) for the Hospitalists listed on amion for assistance.  08/15/2020, 10:53 PM

## 2020-08-16 ENCOUNTER — Inpatient Hospital Stay (HOSPITAL_COMMUNITY): Payer: Self-pay

## 2020-08-16 DIAGNOSIS — R55 Syncope and collapse: Secondary | ICD-10-CM

## 2020-08-16 DIAGNOSIS — N179 Acute kidney failure, unspecified: Principal | ICD-10-CM

## 2020-08-16 DIAGNOSIS — F1123 Opioid dependence with withdrawal: Secondary | ICD-10-CM

## 2020-08-16 DIAGNOSIS — R112 Nausea with vomiting, unspecified: Secondary | ICD-10-CM

## 2020-08-16 DIAGNOSIS — F1910 Other psychoactive substance abuse, uncomplicated: Secondary | ICD-10-CM | POA: Diagnosis present

## 2020-08-16 DIAGNOSIS — I10 Essential (primary) hypertension: Secondary | ICD-10-CM

## 2020-08-16 LAB — BASIC METABOLIC PANEL
Anion gap: 7 (ref 5–15)
BUN: 22 mg/dL — ABNORMAL HIGH (ref 6–20)
CO2: 28 mmol/L (ref 22–32)
Calcium: 8.8 mg/dL — ABNORMAL LOW (ref 8.9–10.3)
Chloride: 98 mmol/L (ref 98–111)
Creatinine, Ser: 1.33 mg/dL — ABNORMAL HIGH (ref 0.61–1.24)
GFR, Estimated: >60 mL/min (ref >60)
Glucose, Bld: 113 mg/dL — ABNORMAL HIGH (ref 70–99)
Potassium: 3.2 mmol/L — ABNORMAL LOW (ref 3.5–5.1)
Sodium: 133 mmol/L — ABNORMAL LOW (ref 135–145)

## 2020-08-16 LAB — ECHOCARDIOGRAM COMPLETE
AR max vel: 2.31 cm2
AV Area VTI: 2.12 cm2
AV Area mean vel: 2.23 cm2
AV Mean grad: 4 mmHg
AV Peak grad: 6.9 mmHg
Ao pk vel: 1.31 m/s
Area-P 1/2: 4.15 cm2
S' Lateral: 3.5 cm
Weight: 2422.59 oz

## 2020-08-16 LAB — URINALYSIS, ROUTINE W REFLEX MICROSCOPIC
Bilirubin Urine: NEGATIVE
Glucose, UA: NEGATIVE mg/dL
Hgb urine dipstick: NEGATIVE
Ketones, ur: NEGATIVE mg/dL
Leukocytes,Ua: NEGATIVE
Nitrite: NEGATIVE
Protein, ur: NEGATIVE mg/dL
Specific Gravity, Urine: 1.013 (ref 1.005–1.030)
pH: 6 (ref 5.0–8.0)

## 2020-08-16 LAB — RAPID URINE DRUG SCREEN, HOSP PERFORMED
Amphetamines: NOT DETECTED
Barbiturates: NOT DETECTED
Benzodiazepines: NOT DETECTED
Cocaine: POSITIVE — AB
Opiates: NOT DETECTED
Tetrahydrocannabinol: POSITIVE — AB

## 2020-08-16 LAB — CBG MONITORING, ED
Glucose-Capillary: 109 mg/dL — ABNORMAL HIGH (ref 70–99)
Glucose-Capillary: 157 mg/dL — ABNORMAL HIGH (ref 70–99)
Glucose-Capillary: 106 mg/dL — ABNORMAL HIGH (ref 70–99)

## 2020-08-16 LAB — CBC
HCT: 45.4 % (ref 39.0–52.0)
Hemoglobin: 15.4 g/dL (ref 13.0–17.0)
MCH: 28.7 pg (ref 26.0–34.0)
MCHC: 33.9 g/dL (ref 30.0–36.0)
MCV: 84.7 fL (ref 80.0–100.0)
Platelets: 317 10*3/uL (ref 150–400)
RBC: 5.36 MIL/uL (ref 4.22–5.81)
RDW: 13.2 % (ref 11.5–15.5)
WBC: 10.7 10*3/uL — ABNORMAL HIGH (ref 4.0–10.5)
nRBC: 0 % (ref 0.0–0.2)

## 2020-08-16 LAB — HEMOGLOBIN A1C
Hgb A1c MFr Bld: 6.6 % — ABNORMAL HIGH (ref 4.8–5.6)
Mean Plasma Glucose: 143 mg/dL

## 2020-08-16 LAB — GLUCOSE, CAPILLARY
Glucose-Capillary: 113 mg/dL — ABNORMAL HIGH (ref 70–99)
Glucose-Capillary: 140 mg/dL — ABNORMAL HIGH (ref 70–99)

## 2020-08-16 LAB — HIV ANTIBODY (ROUTINE TESTING W REFLEX): HIV Screen 4th Generation wRfx: NONREACTIVE

## 2020-08-16 MED ORDER — POTASSIUM CHLORIDE CRYS ER 20 MEQ PO TBCR: 20.0000 meq | EXTENDED_RELEASE_TABLET | Freq: Every day | ORAL | 0 refills | Status: DC

## 2020-08-16 MED ORDER — LOPERAMIDE HCL 2 MG PO TABS: 2.0000 mg | ORAL_TABLET | Freq: Two times a day (BID) | ORAL | 0 refills | Status: DC | PRN

## 2020-08-16 MED ORDER — INSULIN GLARGINE 100 UNIT/ML ~~LOC~~ SOLN
10.0000 [IU] | Freq: Two times a day (BID) | SUBCUTANEOUS | Status: DC
  Administered 2020-08-16: 10 [IU] via SUBCUTANEOUS
  Filled 2020-08-16 (×4): qty 0.1

## 2020-08-16 MED ORDER — AMLODIPINE BESYLATE 10 MG PO TABS
10.0000 mg | ORAL_TABLET | Freq: Every day | ORAL | Status: DC
  Filled 2020-08-16: qty 2

## 2020-08-16 MED ORDER — POTASSIUM CHLORIDE CRYS ER 20 MEQ PO TBCR
40.0000 meq | EXTENDED_RELEASE_TABLET | Freq: Once | ORAL | Status: AC
  Administered 2020-08-16: 40 meq via ORAL
  Filled 2020-08-16: qty 2

## 2020-08-16 MED ORDER — ONDANSETRON 4 MG PO TBDP: 4.0000 mg | ORAL_TABLET | Freq: Three times a day (TID) | ORAL | 0 refills | Status: DC | PRN

## 2020-08-16 MED ORDER — INSULIN GLARGINE 100 UNIT/ML SOLOSTAR PEN: 15.0000 [IU] | PEN_INJECTOR | Freq: Two times a day (BID) | SUBCUTANEOUS | 0 refills | Status: DC

## 2020-08-16 MED ORDER — "PEN NEEDLES 5/16"" 31G X 8 MM MISC": 1 refills | Status: DC

## 2020-08-16 NOTE — ED Notes (Signed)
Tele  Breakfast Ordered 

## 2020-08-16 NOTE — Discharge Summary (Signed)
Physician Discharge Summary  David [name] RUE:[id number] DOB: Jul 11, 1980 DOA: 08/15/2020  PCP: Grayce SessionsEdwards, Michelle P, NP  Admit date: 08/15/2020 Discharge date: 08/16/2020  Admitted From: Home Disposition: Home  Recommendations for Outpatient Follow-up:  Follow up with PCP in 1-2 weeks Please obtain BMP/CBC in one week Metformin discontinued due to ongoing issues with diarrhea/GI complaints.  This can be restarted in the future as felt appropriate Lisinopril discontinued due to acute kidney injury.  Resume in the future as renal function further stabilizes Patient started on Lantus per his request for further management of diabetes, further titration of insulin per primary care physician  Discharge Condition: Stable CODE STATUS: Full code Diet recommendation: Heart healthy, carb modified  Brief/Interim Summary: 40 year old male with history of hypertension, diabetes, polysubstance abuse including cannabis, opiates, cocaine.  Patient reports that he been taking Percocet for the last 2 weeks.  He said that he no longer wanted to take) but he was in agreement with it and therefore he stopped " cold [place]".  After 3 to 4 days of stopping opiates, he began to develop diaphoresis, vomiting, diarrhea.  He became increasingly weak, lightheaded and felt as though he was going to pass out.  He came to the ER for evaluation where he was noted to be in acute kidney injury.  He was started on IV fluid and electrolyte replacement.  Patient reports similar events occurred when he had abruptly stopped opiates in the past. With supportive measures, his overall condition did improve.  GI symptoms including nausea, vomiting and diarrhea have improved/resolved.  He is now tolerating solid diet without recurrent vomiting.  Electrolytes have been replaced.  He does not plan to go back on opiates since he is feeling better now and does not have any chronic pain issues.  He was also noted that his urine toxicology  screen was positive for cannabis and cocaine.  He was counseled on the importance of abstaining from any recreational drug use.  His lisinopril has been discontinued for now in light of his acute kidney injury.  This can certainly be readdressed in the future if his renal function remained stable  He was taking metformin prior to admission, but reported that his blood sugars are ranging from 140 to 250.  He reports being on Lantus in the past and wants to go back on this medication.  He is familiar with using an insulin pen.  Reports he was on 25 units twice daily in the past.  He has been restarted on lower dose Lantus at 15 units twice daily.  He will check his blood sugars regularly at home and follow-up with his primary care physician for further adjustments  Since patient is not having any dizziness, lightheadedness, has been adequately hydrated and is tolerating oral intake, he is felt stable for discharge home.  Discharge Diagnoses:  Principal Problem:   AKI (acute kidney injury) (HCC) Active Problems:   Type 2 diabetes mellitus without complication (HCC)   Hypertension   Near syncope   Opioid dependence with withdrawal [facility](HCC)    Discharge Instructions  Discharge Instructions     Diet - low sodium heart healthy   Complete by: As directed    Increase activity slowly   Complete by: As directed       Allergies as of 08/16/2020   No Known Allergies      Medication List     STOP taking these medications    docusate sodium 100 MG capsule Commonly known as: COLACE  famotidine 20 MG tablet Commonly known as: Pepcid   lisinopril 20 MG tablet Commonly known as: ZESTRIL   metFORMIN 500 MG 24 hr tablet Commonly known as: GLUCOPHAGE-XR   promethazine 25 MG tablet Commonly known as: PHENERGAN   sodium phosphate 7-19 GM/118ML Enem       TAKE these medications    amLODipine 10 MG tablet Commonly known as: NORVASC Take 1 tablet (10 mg total) by mouth daily.    gabapentin 300 MG capsule Commonly known as: NEURONTIN Take 1 capsule (300 mg total) by mouth 3 (three) times daily.   insulin glargine 100 UNIT/ML Solostar Pen Commonly known as: LANTUS Inject 15 Units into the skin 2 (two) times daily. What changed:  how much to take how to take this when to take this additional instructions   loperamide 2 MG tablet Commonly known as: Imodium A-D Take 1 tablet (2 mg total) by mouth 2 (two) times daily as needed for diarrhea or loose stools.   ondansetron 4 MG disintegrating tablet Commonly known as: Zofran ODT Take 1 tablet (4 mg total) by mouth every 8 (eight) hours as needed for nausea or vomiting.   PEN NEEDLES 31GX5/16" 31G X 8 MM Misc As directed   potassium chloride SA 20 MEQ tablet Commonly known as: KLOR-CON Take 1 tablet (20 mEq total) by mouth daily.        No Known Allergies  Consultations:    Procedures/Studies: CT Abdomen Pelvis Wo Contrast  Result Date: 08/15/2020 CLINICAL DATA:  Abdominal pain, vomiting EXAM: CT ABDOMEN AND PELVIS WITHOUT CONTRAST TECHNIQUE: Multidetector CT imaging of the abdomen and pelvis was performed following the standard protocol without IV contrast. COMPARISON:  None. FINDINGS: Lower chest: Lung bases are clear. No effusions. Heart is normal size. Hepatobiliary: No focal hepatic abnormality. Gallbladder unremarkable. Pancreas: No focal abnormality or ductal dilatation. Spleen: No focal abnormality.  Normal size. Adrenals/Urinary Tract: No adrenal abnormality. No focal renal abnormality. No stones or hydronephrosis. Urinary bladder is unremarkable. Stomach/Bowel: Normal appendix. Stomach, large and small bowel grossly unremarkable. Vascular/Lymphatic: No evidence of aneurysm or adenopathy. Reproductive: No visible focal abnormality. Other: No free fluid or free air. Musculoskeletal: No acute bony abnormality. IMPRESSION: No acute findings in the abdomen or pelvis. Electronically Signed   By: Charlett Nose M.D.   On: 08/15/2020 21:48   ECHOCARDIOGRAM COMPLETE  Result Date: 08/16/2020    ECHOCARDIOGRAM REPORT   Patient Name:   David [name] Date of Exam: 08/16/2020 Medical Rec #:  [id number]        Height:       66.0 in Accession #:    [id number]       Weight:       151.4 lb Date of Birth:  1980-07-04        BSA:          1.777 m Patient Age:    39 years         BP:           140/97 mmHg Patient Gender: M                HR:           92 bpm. Exam Location:  Inpatient Procedure: 2D Echo, Cardiac Doppler and Color Doppler Indications:    Syncope  History:        Patient has no prior history of Echocardiogram examinations.  Risk Factors:Diabetes and Current Smoker.  Sonographer:    Shirlean Kelly Referring Phys: [phone] JARED M GARDNER IMPRESSIONS  1. Left ventricular ejection fraction, by estimation, is 50 to 55%. The left ventricle has low normal function. The left ventricle has no regional wall motion abnormalities. Left ventricular diastolic parameters are indeterminate.  2. Right ventricular systolic function is normal. The right ventricular size is normal. Tricuspid regurgitation signal is inadequate for assessing PA pressure.  3. The mitral valve is normal in structure. No evidence of mitral valve regurgitation. No evidence of mitral stenosis.  4. The aortic valve was not well visualized. Aortic valve regurgitation is not visualized. No aortic stenosis is present.  5. The inferior vena cava is normal in size with greater than 50% respiratory variability, suggesting right atrial pressure of 3 mmHg. FINDINGS  Left Ventricle: Left ventricular ejection fraction, by estimation, is 50 to 55%. The left ventricle has low normal function. The left ventricle has no regional wall motion abnormalities. The left ventricular internal cavity size was normal in size. There is no left ventricular hypertrophy. Left ventricular diastolic parameters are indeterminate. Right Ventricle: The right ventricular  size is normal. No increase in right ventricular wall thickness. Right ventricular systolic function is normal. Tricuspid regurgitation signal is inadequate for assessing PA pressure. Left Atrium: Left atrial size was normal in size. Right Atrium: Right atrial size was normal in size. Pericardium: There is no evidence of pericardial effusion. Mitral Valve: The mitral valve is normal in structure. No evidence of mitral valve regurgitation. No evidence of mitral valve stenosis. Tricuspid Valve: The tricuspid valve is normal in structure. Tricuspid valve regurgitation is trivial. Aortic Valve: The aortic valve was not well visualized. Aortic valve regurgitation is not visualized. No aortic stenosis is present. Aortic valve mean gradient measures 4.0 mmHg. Aortic valve peak gradient measures 6.9 mmHg. Aortic valve area, by VTI measures 2.12 cm. Pulmonic Valve: The pulmonic valve was not well visualized. Pulmonic valve regurgitation is not visualized. Aorta: The aortic root and ascending aorta are structurally normal, with no evidence of dilitation. Venous: The inferior vena cava is normal in size with greater than 50% respiratory variability, suggesting right atrial pressure of 3 mmHg. IAS/Shunts: The interatrial septum was not well visualized.  LEFT VENTRICLE PLAX 2D LVIDd:         4.90 cm  Diastology LVIDs:         3.50 cm  LV e' medial:    4.57 cm/s LV PW:         1.00 cm  LV E/e' medial:  14.2 LV IVS:        1.00 cm  LV e' lateral:   6.31 cm/s LVOT diam:     2.20 cm  LV E/e' lateral: 10.3 LV SV:         48 LV SV Index:   27 LVOT Area:     3.80 cm  RIGHT VENTRICLE             IVC RV Basal diam:  2.70 cm     IVC diam: 1.30 cm RV S prime:     10.90 cm/s TAPSE (M-mode): 1.8 cm LEFT ATRIUM             Index       RIGHT ATRIUM           Index LA diam:        3.20 cm 1.80 cm/m  RA Area:     10.60 cm LA Vol (  A2C):   33.1 ml 18.63 ml/m RA Volume:   22.40 ml  12.61 ml/m LA Vol (A4C):   30.8 ml 17.34 ml/m LA Biplane  Vol: 32.5 ml 18.29 ml/m  AORTIC VALVE AV Area (Vmax):    2.31 cm AV Area (Vmean):   2.23 cm AV Area (VTI):     2.12 cm AV Vmax:           131.00 cm/s AV Vmean:          85.300 cm/s AV VTI:            0.224 m AV Peak Grad:      6.9 mmHg AV Mean Grad:      4.0 mmHg LVOT Vmax:         79.70 cm/s LVOT Vmean:        50.100 cm/s LVOT VTI:          0.125 m LVOT/AV VTI ratio: 0.56  AORTA Ao Root diam: 2.90 cm Ao Asc diam:  3.20 cm MITRAL VALVE MV Area (PHT): 4.15 cm    SHUNTS MV Decel Time: 183 msec    Systemic VTI:  0.12 m MV E velocity: 65.10 cm/s  Systemic Diam: 2.20 cm MV A velocity: 70.30 cm/s MV E/A ratio:  0.93 Epifanio Lesches MD Electronically signed by Epifanio Lesches MD Signature Date/Time: 08/16/2020/5:47:10 PM    Final       Subjective: Patient is feeling better.  No further vomiting.  Tolerating diet.  Discharge Exam: Vitals:   08/16/20 1145 08/16/20 1158 08/16/20 1215 08/16/20 1829  BP:  (!) 133/97  (!) 122/100  Pulse: 82 89 86 92  Resp:  16  17  Temp:  97.8 F (36.6 C)  97.9 F (36.6 C)  TempSrc:  Oral  Oral  SpO2: 99% 100% 100% 100%  Weight:   68.7 kg     General: Pt is alert, awake, not in acute distress Cardiovascular: RRR, S1/S2 +, no rubs, no gallops Respiratory: CTA bilaterally, no wheezing, no rhonchi Abdominal: Soft, NT, ND, bowel sounds + Extremities: no edema, no cyanosis    The results of significant diagnostics from this hospitalization (including imaging, microbiology, ancillary and laboratory) are listed below for reference.     Microbiology: Recent Results (from the past 240 hour(s))  Resp Panel by RT-PCR (Flu A&B, Covid)     Status: None   Collection Time: 08/15/20  9:58 PM   Specimen: Nasopharyngeal(NP) swabs in vial transport medium  Result Value Ref Range Status   SARS Coronavirus 2 by RT PCR NEGATIVE NEGATIVE Final    Comment: (NOTE) SARS-CoV-2 target nucleic acids are NOT DETECTED.  The SARS-CoV-2 RNA is generally detectable in upper  respiratory specimens during the acute phase of infection. The lowest concentration of SARS-CoV-2 viral copies this assay can detect is 138 copies/mL. A negative result does not preclude SARS-Cov-2 infection and should not be used as the sole basis for treatment or other patient management decisions. A negative result may occur with  improper specimen collection/handling, submission of specimen other than nasopharyngeal swab, presence of viral mutation(s) within the areas targeted by this assay, and inadequate number of viral copies(<138 copies/mL). A negative result must be combined with clinical observations, patient history, and epidemiological information. The expected result is Negative.  Fact Sheet for Patients:  BloggerCourse.com  Fact Sheet for Healthcare Providers:  SeriousBroker.it  This test is no t yet approved or cleared by the [place] FDA and  has been authorized for detection and/or  diagnosis of SARS-CoV-2 by FDA under an Emergency Use Authorization (EUA). This EUA will remain  in effect (meaning this test can be used) for the duration of the COVID-19 declaration under Section 564(b)(1) of the Act, 21 U.S.C.section 360bbb-3(b)(1), unless the authorization is terminated  or revoked sooner.       Influenza A by PCR NEGATIVE NEGATIVE Final   Influenza B by PCR NEGATIVE NEGATIVE Final    Comment: (NOTE) The Xpert Xpress SARS-CoV-2/FLU/RSV plus assay is intended as an aid in the diagnosis of influenza from Nasopharyngeal swab specimens and should not be used as a sole basis for treatment. Nasal washings and aspirates are unacceptable for Xpert Xpress SARS-CoV-2/FLU/RSV testing.  Fact Sheet for Patients: BloggerCourse.com  Fact Sheet for Healthcare Providers: SeriousBroker.it  This test is not yet approved or cleared by the [place] FDA and has been  authorized for detection and/or diagnosis of SARS-CoV-2 by FDA under an Emergency Use Authorization (EUA). This EUA will remain in effect (meaning this test can be used) for the duration of the COVID-19 declaration under Section 564(b)(1) of the Act, 21 U.S.C. section 360bbb-3(b)(1), unless the authorization is terminated or revoked.  Performed at [facility], [address] [place], [place] [address]      Labs: BNP (last 3 results) No results for input(s): BNP in the last 8760 hours. Basic Metabolic Panel: Recent Labs  Lab 08/15/20 1851 08/16/20 0631  NA 132* 133*  K 3.1* 3.2*  CL 93* 98  CO2 26 28  GLUCOSE 134* 113*  BUN 35* 22*  CREATININE 2.30* 1.33*  CALCIUM 9.1 8.8*   Liver Function Tests: Recent Labs  Lab 08/15/20 1851  AST 27  ALT 27  ALKPHOS 40  BILITOT 1.3*  PROT 7.2  ALBUMIN 4.0   Recent Labs  Lab 08/15/20 1851  LIPASE 108*   No results for input(s): AMMONIA in the last 168 hours. CBC: Recent Labs  Lab 08/15/20 1851 08/16/20 0631  WBC 13.7* 10.7*  NEUTROABS 8.5*  --   HGB 16.9 15.4  HCT 49.9 45.4  MCV 85.4 84.7  PLT 394 317   Cardiac Enzymes: No results for input(s): CKTOTAL, CKMB, CKMBINDEX, TROPONINI in the last 168 hours. BNP: Invalid input(s): POCBNP CBG: Recent Labs  Lab 08/16/20 0346 08/16/20 0934 08/16/20 1156 08/16/20 1242 08/16/20 1631  GLUCAP 109* 157* 106* 113* 140*   D-Dimer No results for input(s): DDIMER in the last 72 hours. Hgb A1c No results for input(s): HGBA1C in the last 72 hours. Lipid Profile No results for input(s): CHOL, HDL, LDLCALC, TRIG, CHOLHDL, LDLDIRECT in the last 72 hours. Thyroid function studies No results for input(s): TSH, T4TOTAL, T3FREE, THYROIDAB in the last 72 hours.  Invalid input(s): FREET3 Anemia work up No results for input(s): VITAMINB12, FOLATE, FERRITIN, TIBC, IRON, RETICCTPCT in the last 72 hours. Urinalysis    Component Value Date/Time   COLORURINE YELLOW  08/16/2020 0631   APPEARANCEUR CLEAR 08/16/2020 0631   LABSPEC 1.013 08/16/2020 0631   PHURINE 6.0 08/16/2020 0631   GLUCOSEU NEGATIVE 08/16/2020 0631   HGBUR NEGATIVE 08/16/2020 0631   BILIRUBINUR NEGATIVE 08/16/2020 0631   BILIRUBINUR negative 07/28/2015 1438   BILIRUBINUR Small 06/15/2014 1841   KETONESUR NEGATIVE 08/16/2020 0631   PROTEINUR NEGATIVE 08/16/2020 0631   UROBILINOGEN 0.2 07/28/2015 1438   UROBILINOGEN 0.2 09/23/2014 1009   NITRITE NEGATIVE 08/16/2020 0631   LEUKOCYTESUR NEGATIVE 08/16/2020 0631   Sepsis Labs Invalid input(s): PROCALCITONIN,  WBC,  LACTICIDVEN Microbiology Recent Results (from  the past 240 hour(s))  Resp Panel by RT-PCR (Flu A&B, Covid)     Status: None   Collection Time: 08/15/20  9:58 PM   Specimen: Nasopharyngeal(NP) swabs in vial transport medium  Result Value Ref Range Status   SARS Coronavirus 2 by RT PCR NEGATIVE NEGATIVE Final    Comment: (NOTE) SARS-CoV-2 target nucleic acids are NOT DETECTED.  The SARS-CoV-2 RNA is generally detectable in upper respiratory specimens during the acute phase of infection. The lowest concentration of SARS-CoV-2 viral copies this assay can detect is 138 copies/mL. A negative result does not preclude SARS-Cov-2 infection and should not be used as the sole basis for treatment or other patient management decisions. A negative result may occur with  improper specimen collection/handling, submission of specimen other than nasopharyngeal swab, presence of viral mutation(s) within the areas targeted by this assay, and inadequate number of viral copies(<138 copies/mL). A negative result must be combined with clinical observations, patient history, and epidemiological information. The expected result is Negative.  Fact Sheet for Patients:  BloggerCourse.com  Fact Sheet for Healthcare Providers:  SeriousBroker.it  This test is no t yet approved or cleared by  the [place] FDA and  has been authorized for detection and/or diagnosis of SARS-CoV-2 by FDA under an Emergency Use Authorization (EUA). This EUA will remain  in effect (meaning this test can be used) for the duration of the COVID-19 declaration under Section 564(b)(1) of the Act, 21 U.S.C.section 360bbb-3(b)(1), unless the authorization is terminated  or revoked sooner.       Influenza A by PCR NEGATIVE NEGATIVE Final   Influenza B by PCR NEGATIVE NEGATIVE Final    Comment: (NOTE) The Xpert Xpress SARS-CoV-2/FLU/RSV plus assay is intended as an aid in the diagnosis of influenza from Nasopharyngeal swab specimens and should not be used as a sole basis for treatment. Nasal washings and aspirates are unacceptable for Xpert Xpress SARS-CoV-2/FLU/RSV testing.  Fact Sheet for Patients: BloggerCourse.com  Fact Sheet for Healthcare Providers: SeriousBroker.it  This test is not yet approved or cleared by the [place] FDA and has been authorized for detection and/or diagnosis of SARS-CoV-2 by FDA under an Emergency Use Authorization (EUA). This EUA will remain in effect (meaning this test can be used) for the duration of the COVID-19 declaration under Section 564(b)(1) of the Act, 21 U.S.C. section 360bbb-3(b)(1), unless the authorization is terminated or revoked.  Performed at [facility], [address] [place], [place] [address]      Time coordinating discharge:  SIGNED:   Erick Blinks, MD  [facility] 08/16/2020, 6:36 PM   If 7PM-7AM, please contact night-coverage [email or web address]

## 2020-08-16 NOTE — Progress Notes (Signed)
DISCHARGE NOTE HOME David [name] to be discharged Home per MD order. Discussed prescriptions and follow up appointments with the patient. Prescriptions given to patient; medication list explained in detail. Patient verbalized understanding.  Skin clean, dry and intact without evidence of skin break down, no evidence of skin tears noted. IV catheter discontinued intact. Site without signs and symptoms of complications. Dressing and pressure applied. Pt denies pain at the site currently. No complaints noted.  Patient free of lines, drains, and wounds.   An After Visit Summary (AVS) was printed and given to the patient. Patient escorted by wife walking with steady gait and discharged home via private auto.  Luisa Hart, RN

## 2020-08-16 NOTE — ED Notes (Signed)
Patient requesting nausea meds.  Sent message to Attending.

## 2020-08-17 ENCOUNTER — Telehealth: Payer: Self-pay

## 2020-08-17 NOTE — Telephone Encounter (Signed)
Transition Care Management Follow-up Telephone Call   Date of discharge and from where:[facility] on 08/16/2020 How have you been since you were released from the hospital? Pt said felling "ok" Any questions or concerns? No questions/concerns reported. Stated he checked his BS in the morning and was 167. Reports no symptoms.   Items Reviewed: Did the pt receive and understand the discharge instructions provided? have the instructions and have no questions.  Medications obtained and verified? He said that they have the medication list  and the hospital staff reviewed them in detail prior to discharge. He said that he has all of the medications and they have no questions.  Any new allergies since your discharge? None reported  Do you have support at home? Yes, wife Other (ie: DME, Home Health, etc)        Functional Questionnaire: (I = Independent and D = Dependent) ADL's:  Independent.        Follow up appointments reviewed:   PCP Hospital f/u appt confirmed? NP Gwinda Passe on 08/31/2020.  Specialist Hospital f/u appt confirmed? None scheduled at this time  Are transportation arrangements needed? have transportation   If their condition worsens, is the pt aware to call  their PCP or go to the ED? Yes.Made pt aware if condition worsen or start experiencing rapid weight gain, chest pain, diff breathing, SOB, high fevers, or bleading to refer imediately to ED for further evaluation.  Was the patient provided with contact information for the PCP's office or ED? He has the phone number  Was the pt encouraged to call back with questions or concerns?yes

## 2020-08-31 ENCOUNTER — Ambulatory Visit (INDEPENDENT_AMBULATORY_CARE_PROVIDER_SITE_OTHER): Payer: Self-pay | Admitting: Primary Care

## 2020-09-21 ENCOUNTER — Encounter (HOSPITAL_COMMUNITY): Payer: Self-pay

## 2020-09-21 ENCOUNTER — Other Ambulatory Visit: Payer: Self-pay

## 2020-09-21 ENCOUNTER — Inpatient Hospital Stay (HOSPITAL_COMMUNITY)
Admission: EM | Admit: 2020-09-21 | Discharge: 2020-09-23 | DRG: 378 | Disposition: A | Discharge status: Home / Self Care | Payer: Self-pay | Attending: Internal Medicine | Admitting: Internal Medicine

## 2020-09-21 ENCOUNTER — Observation Stay (HOSPITAL_COMMUNITY): Payer: Self-pay

## 2020-09-21 DIAGNOSIS — Z20822 Contact with and (suspected) exposure to covid-19: Secondary | ICD-10-CM | POA: Diagnosis present

## 2020-09-21 DIAGNOSIS — K920 Hematemesis: Secondary | ICD-10-CM

## 2020-09-21 DIAGNOSIS — F1090 Alcohol use, unspecified, uncomplicated: Secondary | ICD-10-CM

## 2020-09-21 DIAGNOSIS — R1013 Epigastric pain: Secondary | ICD-10-CM | POA: Diagnosis present

## 2020-09-21 DIAGNOSIS — Z789 Other specified health status: Secondary | ICD-10-CM

## 2020-09-21 DIAGNOSIS — K922 Gastrointestinal hemorrhage, unspecified: Secondary | ICD-10-CM

## 2020-09-21 DIAGNOSIS — K2100 Gastro-esophageal reflux disease with esophagitis, without bleeding: Secondary | ICD-10-CM | POA: Diagnosis present

## 2020-09-21 DIAGNOSIS — I152 Hypertension secondary to endocrine disorders: Secondary | ICD-10-CM

## 2020-09-21 DIAGNOSIS — K2970 Gastritis, unspecified, without bleeding: Secondary | ICD-10-CM | POA: Diagnosis present

## 2020-09-21 DIAGNOSIS — R7401 Elevation of levels of liver transaminase levels: Secondary | ICD-10-CM | POA: Diagnosis present

## 2020-09-21 DIAGNOSIS — Z833 Family history of diabetes mellitus: Secondary | ICD-10-CM

## 2020-09-21 DIAGNOSIS — Z794 Long term (current) use of insulin: Secondary | ICD-10-CM

## 2020-09-21 DIAGNOSIS — E860 Dehydration: Secondary | ICD-10-CM | POA: Diagnosis present

## 2020-09-21 DIAGNOSIS — K449 Diaphragmatic hernia without obstruction or gangrene: Secondary | ICD-10-CM | POA: Diagnosis present

## 2020-09-21 DIAGNOSIS — Z7289 Other problems related to lifestyle: Secondary | ICD-10-CM

## 2020-09-21 DIAGNOSIS — E119 Type 2 diabetes mellitus without complications: Secondary | ICD-10-CM

## 2020-09-21 DIAGNOSIS — I10 Essential (primary) hypertension: Secondary | ICD-10-CM | POA: Diagnosis present

## 2020-09-21 DIAGNOSIS — E1142 Type 2 diabetes mellitus with diabetic polyneuropathy: Secondary | ICD-10-CM | POA: Diagnosis present

## 2020-09-21 DIAGNOSIS — E876 Hypokalemia: Secondary | ICD-10-CM | POA: Diagnosis present

## 2020-09-21 DIAGNOSIS — F10939 Alcohol use, unspecified with withdrawal, unspecified: Secondary | ICD-10-CM | POA: Diagnosis present

## 2020-09-21 DIAGNOSIS — F17210 Nicotine dependence, cigarettes, uncomplicated: Secondary | ICD-10-CM | POA: Diagnosis present

## 2020-09-21 DIAGNOSIS — E118 Type 2 diabetes mellitus with unspecified complications: Secondary | ICD-10-CM

## 2020-09-21 DIAGNOSIS — F10239 Alcohol dependence with withdrawal, unspecified: Secondary | ICD-10-CM | POA: Diagnosis present

## 2020-09-21 DIAGNOSIS — K2901 Acute gastritis with bleeding: Principal | ICD-10-CM | POA: Diagnosis present

## 2020-09-21 HISTORY — DX: Essential (primary) hypertension: I10

## 2020-09-21 LAB — CBC WITH DIFFERENTIAL/PLATELET
Abs Immature Granulocytes: 0.03 10*3/uL (ref 0.00–0.07)
Basophils Absolute: 0 10*3/uL (ref 0.0–0.1)
Basophils Relative: 0 %
Eosinophils Absolute: 0 10*3/uL (ref 0.0–0.5)
Eosinophils Relative: 0 %
HCT: 46.6 % (ref 39.0–52.0)
Hemoglobin: 15.6 g/dL (ref 13.0–17.0)
Immature Granulocytes: 0 %
Lymphocytes Relative: 13 %
Lymphs Abs: 1.4 10*3/uL (ref 0.7–4.0)
MCH: 28.2 pg (ref 26.0–34.0)
MCHC: 33.5 g/dL (ref 30.0–36.0)
MCV: 84.3 fL (ref 80.0–100.0)
Monocytes Absolute: 0.8 10*3/uL (ref 0.1–1.0)
Monocytes Relative: 8 %
Neutro Abs: 8.7 10*3/uL — ABNORMAL HIGH (ref 1.7–7.7)
Neutrophils Relative %: 79 %
Platelets: 370 10*3/uL (ref 150–400)
RBC: 5.53 MIL/uL (ref 4.22–5.81)
RDW: 13.5 % (ref 11.5–15.5)
WBC: 11 10*3/uL — ABNORMAL HIGH (ref 4.0–10.5)
nRBC: 0 % (ref 0.0–0.2)

## 2020-09-21 LAB — COMPREHENSIVE METABOLIC PANEL
ALT: 24 U/L (ref 0–44)
AST: 45 U/L — ABNORMAL HIGH (ref 15–41)
Albumin: 4.7 g/dL (ref 3.5–5.0)
Alkaline Phosphatase: 47 U/L (ref 38–126)
Anion gap: 13 (ref 5–15)
BUN: 18 mg/dL (ref 6–20)
CO2: 26 mmol/L (ref 22–32)
Calcium: 10 mg/dL (ref 8.9–10.3)
Chloride: 99 mmol/L (ref 98–111)
Creatinine, Ser: 0.85 mg/dL (ref 0.61–1.24)
GFR, Estimated: >60 mL/min (ref >60)
Glucose, Bld: 133 mg/dL — ABNORMAL HIGH (ref 70–99)
Potassium: 3.5 mmol/L (ref 3.5–5.1)
Sodium: 138 mmol/L (ref 135–145)
Total Bilirubin: 1.4 mg/dL — ABNORMAL HIGH (ref 0.3–1.2)
Total Protein: 8.4 g/dL — ABNORMAL HIGH (ref 6.5–8.1)

## 2020-09-21 LAB — RESP PANEL BY RT-PCR (FLU A&B, COVID) ARPGX2
Influenza A by PCR: NEGATIVE
Influenza B by PCR: NEGATIVE
SARS Coronavirus 2 by RT PCR: NEGATIVE

## 2020-09-21 LAB — LIPASE, BLOOD: Lipase: 30 U/L (ref 11–51)

## 2020-09-21 MED ORDER — ONDANSETRON HCL 4 MG PO TABS: 4.0000 mg | ORAL_TABLET | Freq: Four times a day (QID) | ORAL | Status: DC | PRN

## 2020-09-21 MED ORDER — LACTATED RINGERS IV SOLN: INTRAVENOUS | Status: AC

## 2020-09-21 MED ORDER — IOHEXOL 350 MG/ML SOLN
80.0000 mL | Freq: Once | INTRAVENOUS | Status: AC | PRN
  Administered 2020-09-21: 80 mL via INTRAVENOUS

## 2020-09-21 MED ORDER — THIAMINE HCL 100 MG/ML IJ SOLN: 100.0000 mg | Freq: Every day | INTRAMUSCULAR | Status: DC

## 2020-09-21 MED ORDER — THIAMINE HCL 100 MG PO TABS
100.0000 mg | ORAL_TABLET | Freq: Every day | ORAL | Status: DC
  Administered 2020-09-22 – 2020-09-23 (×2): 100 mg via ORAL
  Filled 2020-09-21 (×2): qty 1

## 2020-09-21 MED ORDER — PANTOPRAZOLE SODIUM 40 MG IV SOLR
40.0000 mg | Freq: Two times a day (BID) | INTRAVENOUS | Status: DC
  Administered 2020-09-22: 40 mg via INTRAVENOUS
  Filled 2020-09-21: qty 40

## 2020-09-21 MED ORDER — ACETAMINOPHEN 325 MG PO TABS: 650.0000 mg | ORAL_TABLET | Freq: Four times a day (QID) | ORAL | Status: DC | PRN

## 2020-09-21 MED ORDER — ONDANSETRON HCL 4 MG/2ML IJ SOLN: 4.0000 mg | Freq: Four times a day (QID) | INTRAMUSCULAR | Status: DC | PRN

## 2020-09-21 MED ORDER — PANTOPRAZOLE 80MG IVPB - SIMPLE MED
80.0000 mg | Freq: Once | INTRAVENOUS | Status: AC
  Administered 2020-09-21: 80 mg via INTRAVENOUS
  Filled 2020-09-21: qty 80

## 2020-09-21 MED ORDER — ACETAMINOPHEN 650 MG RE SUPP: 650.0000 mg | Freq: Four times a day (QID) | RECTAL | Status: DC | PRN

## 2020-09-21 MED ORDER — SODIUM CHLORIDE (PF) 0.9 % IJ SOLN
INTRAMUSCULAR | Status: AC
  Filled 2020-09-21: qty 50

## 2020-09-21 MED ORDER — GABAPENTIN 300 MG PO CAPS
300.0000 mg | ORAL_CAPSULE | Freq: Three times a day (TID) | ORAL | Status: DC
  Administered 2020-09-22 – 2020-09-23 (×5): 300 mg via ORAL
  Filled 2020-09-21 (×5): qty 1

## 2020-09-21 MED ORDER — LORAZEPAM 2 MG/ML IJ SOLN: 1.0000 mg | INTRAMUSCULAR | Status: DC | PRN

## 2020-09-21 MED ORDER — INSULIN GLARGINE 100 UNIT/ML ~~LOC~~ SOLN
10.0000 [IU] | Freq: Every day | SUBCUTANEOUS | Status: DC
  Administered 2020-09-22: 10 [IU] via SUBCUTANEOUS
  Filled 2020-09-21 (×2): qty 0.1

## 2020-09-21 MED ORDER — ONDANSETRON HCL 4 MG/2ML IJ SOLN
4.0000 mg | Freq: Once | INTRAMUSCULAR | Status: AC
Start: 2020-09-21 — End: 2020-09-21
  Administered 2020-09-21: 4 mg via INTRAVENOUS
  Filled 2020-09-21: qty 2

## 2020-09-21 MED ORDER — LACTATED RINGERS IV BOLUS
1000.0000 mL | Freq: Once | INTRAVENOUS | Status: AC
  Administered 2020-09-22: 1000 mL via INTRAVENOUS

## 2020-09-21 MED ORDER — SODIUM CHLORIDE 0.9 % IV SOLN
1.0000 g | Freq: Once | INTRAVENOUS | Status: AC
  Administered 2020-09-21: 1 g via INTRAVENOUS
  Filled 2020-09-21: qty 10

## 2020-09-21 MED ORDER — AMLODIPINE BESYLATE 10 MG PO TABS
10.0000 mg | ORAL_TABLET | Freq: Every day | ORAL | Status: DC
  Administered 2020-09-22 – 2020-09-23 (×3): 10 mg via ORAL
  Filled 2020-09-21 (×2): qty 2
  Filled 2020-09-21: qty 1

## 2020-09-21 MED ORDER — ONDANSETRON 4 MG PO TBDP
4.0000 mg | ORAL_TABLET | Freq: Once | ORAL | Status: AC
  Administered 2020-09-21: 4 mg via ORAL
  Filled 2020-09-21: qty 1

## 2020-09-21 MED ORDER — FOLIC ACID 1 MG PO TABS
1.0000 mg | ORAL_TABLET | Freq: Every day | ORAL | Status: DC
  Administered 2020-09-22 – 2020-09-23 (×2): 1 mg via ORAL
  Filled 2020-09-21 (×2): qty 1

## 2020-09-21 MED ORDER — POLYETHYLENE GLYCOL 3350 17 G PO PACK: 17.0000 g | PACK | Freq: Every day | ORAL | Status: DC | PRN

## 2020-09-21 MED ORDER — SODIUM CHLORIDE 0.9 % IV BOLUS
1000.0000 mL | Freq: Once | INTRAVENOUS | Status: AC
  Administered 2020-09-21: 1000 mL via INTRAVENOUS

## 2020-09-21 MED ORDER — ADULT MULTIVITAMIN W/MINERALS CH
1.0000 | ORAL_TABLET | Freq: Every day | ORAL | Status: DC
  Administered 2020-09-22 – 2020-09-23 (×2): 1 via ORAL
  Filled 2020-09-21 (×2): qty 1

## 2020-09-21 MED ORDER — LORAZEPAM 1 MG PO TABS: 1.0000 mg | ORAL_TABLET | ORAL | Status: DC | PRN

## 2020-09-21 NOTE — ED Provider Notes (Signed)
Emergency Medicine Provider Triage Evaluation Note  David [name] , a 40 y.o. male  was evaluated in triage.  Pt complains of nausea, vomiting, and abdominal pain that started yesterday.  Patient admits to 8 episodes of nonbilious, dark emesis which he believes is blood.  No history of GI bleed. He is not on any anticoagulants. Patient is also concerned about his high blood pressure.  Patient states he has not taken his blood pressure medications today due to inability to keep anything down.  Patient states he stopped drinking alcohol 2 days ago.  He normally drinks 1/5 of liquor daily.  Patient also states he stopped smoking marijuana.  Last use 1 month ago. Patient was recently admitted to the hospital on 6/12-6/13 due to intractable nausea and vomiting which were thought to be related to opiate withdrawal. No opioids since admission.  No previous abdominal operations.  Review of Systems  Positive: Abd pain, nausea, vomiting Negative:   Physical Exam  BP (!) 167/109   Pulse 96   Temp 99 F (37.2 C) (Oral)   Resp 18   Ht 5\' 7"  (1.702 m)   Wt 66 kg   SpO2 100%   BMI 22.80 kg/m  Gen:   Awake, no distress   Resp:  Normal effort  MSK:   Moves extremities without difficulty  Other:    Medical Decision Making  Medically screening exam initiated at 3:41 PM.  Appropriate orders placed.  David [name] was informed that the remainder of the evaluation will be completed by another provider, this initial triage assessment does not replace that evaluation, and the importance of remaining in the ED until their evaluation is complete.  Abdominal labs ordered Zofran given   Scharlene Gloss 09/21/20 1548    09/23/20, MD 09/21/20 [phone]

## 2020-09-21 NOTE — ED Triage Notes (Signed)
Patient c/o mid abdominal pain and states hematemesis x 8 since yesterday. Patient states emesis is dark in color. Patient also c/o hypertension. BP in triage-167/109. Patient reports that he has not had his BP med today.

## 2020-09-21 NOTE — ED Notes (Signed)
Family at bedside. Patient is in room, in gown. Medications started.

## 2020-09-21 NOTE — ED Notes (Signed)
Patient is asleep in bed

## 2020-09-21 NOTE — ED Provider Notes (Signed)
[facility]-EMERGENCY DEPT Provider Note   CSN: [id number] Arrival date & time: 09/21/20  1439     History Chief Complaint  Patient presents with   Hematemesis   Hypertension   Abdominal Pain    David [name] is a 40 y.o. male.  States that after a few episodes of vomiting, he developed what looks like hematemesis.  He drinks 1/5 of liquor every day and has done so for years.  No history of prior GI bleed.  The history is provided by the patient.  Emesis Severity:  Severe Duration:  1 day Timing:  Intermittent Number of daily episodes:  8 Quality:  Coffee grounds Progression:  Unchanged Chronicity:  New Recent urination:  Normal Relieved by:  Nothing Worsened by:  Nothing Ineffective treatments:  None tried Associated symptoms: abdominal pain   Associated symptoms: no arthralgias, no chills, no cough, no diarrhea, no fever and no sore throat   Risk factors: alcohol use and diabetes       Past Medical History:  Diagnosis Date   Diabetes mellitus (HCC)    Hypertension    Metacarpal bone fracture 07/11/2015   right small   Neuropathy    Seizure with provoking factor (HCC)    states seizure was due to alcohol     Patient Active Problem List   Diagnosis Date Noted   Polysubstance abuse (HCC) 08/16/2020   Near syncope 08/15/2020   AKI (acute kidney injury) (HCC) 08/15/2020   Opioid dependence with withdrawal (HCC) 08/15/2020   Alcohol abuse 04/05/2016   Hypertension 04/05/2016   Type 2 diabetes mellitus without complication (HCC) 12/02/2013    Past Surgical History:  Procedure Laterality Date   NO PAST SURGERIES         Family History  Problem Relation Age of Onset   Diabetes Father     Social History   Tobacco Use   Smoking status: Every Day    Packs/day: 1.00    Years: 10.00    Pack years: 10.00    Types: Cigarettes   Smokeless tobacco: Never  Vaping Use   Vaping Use: Never used  Substance Use Topics   Alcohol  use: Yes    Comment: one pint daily- sts stopped daily use a few months ago   Drug use: Not Currently    Types: Oxycodone    Home Medications Prior to Admission medications   Medication Sig Start Date End Date Taking? Authorizing Provider  amLODipine (NORVASC) 10 MG tablet Take 1 tablet (10 mg total) by mouth daily. 06/14/20   Grayce Sessions, NP  gabapentin (NEURONTIN) 300 MG capsule Take 1 capsule (300 mg total) by mouth 3 (three) times daily. 04/18/17   Anders Simmonds, PA-C  insulin glargine (LANTUS) 100 UNIT/ML Solostar Pen Inject 15 Units into the skin 2 (two) times daily. 08/16/20   Erick Blinks, MD  Insulin Pen Needle (PEN NEEDLES 31GX5/16") 31G X 8 MM MISC As directed 08/16/20   Erick Blinks, MD  loperamide (IMODIUM A-D) 2 MG tablet Take 1 tablet (2 mg total) by mouth 2 (two) times daily as needed for diarrhea or loose stools. 08/16/20   Erick Blinks, MD  ondansetron (ZOFRAN ODT) 4 MG disintegrating tablet Take 1 tablet (4 mg total) by mouth every 8 (eight) hours as needed for nausea or vomiting. 08/16/20   Erick Blinks, MD  potassium chloride SA (KLOR-CON) 20 MEQ tablet Take 1 tablet (20 mEq total) by mouth daily. 08/16/20   Erick Blinks, MD  Allergies    Patient has no known allergies.  Review of Systems   Review of Systems  Constitutional:  Negative for chills and fever.  HENT:  Negative for ear pain and sore throat.   Eyes:  Negative for pain and visual disturbance.  Respiratory:  Negative for cough and shortness of breath.   Cardiovascular:  Negative for chest pain and palpitations.  Gastrointestinal:  Positive for abdominal pain and vomiting. Negative for diarrhea.  Genitourinary:  Negative for dysuria and hematuria.  Musculoskeletal:  Negative for arthralgias and back pain.  Skin:  Negative for color change and rash.  Neurological:  Negative for seizures and syncope.  All other systems reviewed and are negative.  Physical Exam Updated Vital  Signs BP (!) 167/109   Pulse 96   Temp 99 F (37.2 C) (Oral)   Resp 18   Ht 5\' 7"  (1.702 m)   Wt 66 kg   SpO2 100%   BMI 22.80 kg/m   Physical Exam Vitals and nursing note reviewed.  Constitutional:      Appearance: Normal appearance.  HENT:     Head: Normocephalic and atraumatic.  Eyes:     Conjunctiva/sclera: Conjunctivae normal.  Pulmonary:     Effort: Pulmonary effort is normal. No respiratory distress.  Abdominal:     Palpations: Abdomen is soft.     Tenderness: There is generalized abdominal tenderness. There is no guarding or rebound.  Musculoskeletal:        General: No deformity. Normal range of motion.     Cervical back: Normal range of motion.  Skin:    General: Skin is warm and dry.  Neurological:     General: No focal deficit present.     Mental Status: He is alert and oriented to person, place, and time. Mental status is at baseline.  Psychiatric:        Mood and Affect: Mood normal.    ED Results / Procedures / Treatments   Labs (all labs ordered are listed, but only abnormal results are displayed) Labs Reviewed  CBC WITH DIFFERENTIAL/PLATELET - Abnormal; Notable for the following components:      Result Value   WBC 11.0 (*)    Neutro Abs 8.7 (*)    All other components within normal limits  COMPREHENSIVE METABOLIC PANEL - Abnormal; Notable for the following components:   Glucose, Bld 133 (*)    Total Protein 8.4 (*)    AST 45 (*)    Total Bilirubin 1.4 (*)    All other components within normal limits  RESP PANEL BY RT-PCR (FLU A&B, COVID) ARPGX2  LIPASE, BLOOD  URINALYSIS, ROUTINE W REFLEX MICROSCOPIC    EKG None  Radiology No results found.  Procedures Procedures   Medications Ordered in ED Medications  pantoprazole (PROTONIX) 80 mg /NS 100 mL IVPB (has no administration in time range)  cefTRIAXone (ROCEPHIN) 1 g in sodium chloride 0.9 % 100 mL IVPB (1 g Intravenous New Bag/Given 09/21/20 1943)  ondansetron (ZOFRAN-ODT)  disintegrating tablet 4 mg (4 mg Oral Given 09/21/20 1558)  sodium chloride 0.9 % bolus 1,000 mL (1,000 mLs Intravenous New Bag/Given 09/21/20 1941)  ondansetron (ZOFRAN) injection 4 mg (4 mg Intravenous Given 09/21/20 1943)    ED Course  I have reviewed the triage vital signs and the nursing notes.  Pertinent labs & imaging results that were available during my care of the patient were reviewed by me and considered in my medical decision making (see chart for details).  Clinical Course as of 09/21/20 2230  Tue Sep 21, 2020  0263 I spoke with Dr. Marca Ancona who will have the patient admitted. Clear liquid OK. NPO after MN. [AW]  2025 I spoke with Dr. Leafy Half who will admit the patient. [AW]    Clinical Course User Index [AW] Koleen Distance, MD   MDM Rules/Calculators/A&P                           Hazle Nordmann has a history of heavy alcohol use.  He endorses 8 episodes of vomiting with several episodes of hematemesis.  Hemodynamically stable here with normal hemoglobin.  After consultation with GI, patient will be admitted to the hospital. Final Clinical Impression(s) / ED Diagnoses Final diagnoses:  Hematemesis with nausea  Alcohol use  Type 2 diabetes mellitus with complication, with long-term current use of insulin [facility])    Rx / DC Orders ED Discharge Orders     None        Koleen Distance, MD 09/21/20 2230

## 2020-09-21 NOTE — H&P (Signed)
History and Physical    David [name]:[id number] DOB: 1980-06-09 DOA: 09/21/2020  PCP: Grayce Sessions, NP  Patient coming from: Home   Chief Complaint:  Chief Complaint  Patient presents with   Hematemesis   Hypertension   Abdominal Pain     HPI:    40 year old male with past medical history of insulin-dependent diabetes mellitus type 2, hypertension, alcohol abuse presenting to [facility] emergency department complaints of abdominal pain nausea and vomiting.  Patient explains that approximately 36 hours ago he began to experience intense nausea.  This was followed by frequent bouts of vomiting.  Patient continued to experience episodes of vomiting throughout the evening.  Patient explains that by the following day patient's episodes of vomiting produced coffee-ground emesis.  This occurred at least 4 times.  Upon further questioning patient does admit to regular alcohol use drinking a minimum of 4 shots daily typically in the form of mixed drinks.  Patient states last alcoholic drinks were yesterday at lunch.  Patient denies NSAID use or any other blood thinner use, fevers, dysuria, low back pain, sick contacts, recent travel or contact with confirmed COVID-19 infection.  Patient symptoms continue to persist until he eventually presented to [facility] emergency department for evaluation.  Review of Systems:   Review of Systems  Gastrointestinal:  Positive for abdominal pain, nausea and vomiting.       Coffee ground emesis  All other systems reviewed and are negative.  Past Medical History:  Diagnosis Date   Diabetes mellitus (HCC)    Hypertension    Metacarpal bone fracture 07/11/2015   right small   Neuropathy    Seizure with provoking factor (HCC)    states seizure was due to alcohol     Past Surgical History:  Procedure Laterality Date   NO PAST SURGERIES       reports that he has been smoking cigarettes. He has a 10.00  pack-year smoking history. He has never used smokeless tobacco. He reports current alcohol use of about 30.0 standard drinks of alcohol per week. He reports current drug use. Drugs: Oxycodone and Marijuana.  No Known Allergies  Family History  Problem Relation Age of Onset   Diabetes Father      Prior to Admission medications   Medication Sig Start Date End Date Taking? Authorizing Provider  amLODipine (NORVASC) 10 MG tablet Take 1 tablet (10 mg total) by mouth daily. 06/14/20   Grayce Sessions, NP  gabapentin (NEURONTIN) 300 MG capsule Take 1 capsule (300 mg total) by mouth 3 (three) times daily. 04/18/17   Anders Simmonds, PA-C  insulin glargine (LANTUS) 100 UNIT/ML Solostar Pen Inject 15 Units into the skin 2 (two) times daily. 08/16/20   Erick Blinks, MD  Insulin Pen Needle (PEN NEEDLES 31GX5/16") 31G X 8 MM MISC As directed 08/16/20   Erick Blinks, MD  loperamide (IMODIUM A-D) 2 MG tablet Take 1 tablet (2 mg total) by mouth 2 (two) times daily as needed for diarrhea or loose stools. 08/16/20   Erick Blinks, MD  ondansetron (ZOFRAN ODT) 4 MG disintegrating tablet Take 1 tablet (4 mg total) by mouth every 8 (eight) hours as needed for nausea or vomiting. 08/16/20   Erick Blinks, MD  potassium chloride SA (KLOR-CON) 20 MEQ tablet Take 1 tablet (20 mEq total) by mouth daily. 08/16/20   Erick Blinks, MD    Physical Exam: Vitals:   09/21/20 1511 09/21/20 1945 09/21/20 2130  BP: (!) 167/109 Marland Kitchen)  157/103 (!) 170/110  Pulse: 96 79 76  Resp: 18 18 16   Temp: 99 F (37.2 C)    TempSrc: Oral    SpO2: 100% 100% 99%  Weight: 66 kg    Height: 5\' 7"  (1.702 m)      Constitutional: Patient is lethargic arousable and oriented x3, no associated distress.   Skin: no rashes, no lesions, poor skin turgor noted. Eyes: Pupils are equally reactive to light.  No evidence of scleral icterus or conjunctival pallor.  ENMT: Dry mucous membranes noted.  Posterior pharynx clear of any exudate  or lesions.   Neck: normal, supple, no masses, no thyromegaly.  No evidence of jugular venous distension.   Respiratory: clear to auscultation bilaterally, no wheezing, no crackles. Normal respiratory effort. No accessory muscle use.  Cardiovascular: Regular rate and rhythm, no murmurs / rubs / gallops. No extremity edema. 2+ pedal pulses. No carotid bruits.  Chest:   Nontender without crepitus or deformity.   Back:   Nontender without crepitus or deformity. Abdomen: Notable epigastric tenderness, abdomen is soft however.  No evidence of intra-abdominal masses.  Positive bowel sounds noted in all quadrants.   Musculoskeletal: No joint deformity upper and lower extremities. Good ROM, no contractures. Normal muscle tone.  Neurologic: Notable resting tremor.  CN 2-12 grossly intact. Sensation intact.  Patient moving all 4 extremities spontaneously.  Patient is following all commands.  Patient is responsive to verbal stimuli.   Psychiatric: Patient exhibits depressed mood with flat affect.  Patient seems to possess insight as to their current situation.     Labs on Admission: I have personally reviewed following labs and imaging studies -   CBC: Recent Labs  Lab 09/21/20 1753  WBC 11.0*  NEUTROABS 8.7*  HGB 15.6  HCT 46.6  MCV 84.3  PLT 370   Basic Metabolic Panel: Recent Labs  Lab 09/21/20 1753  NA 138  K 3.5  CL 99  CO2 26  GLUCOSE 133*  BUN 18  CREATININE 0.85  CALCIUM 10.0   GFR: Estimated Creatinine Clearance: 111.1 mL/min (by C-G formula based on SCr of 0.85 mg/dL). Liver Function Tests: Recent Labs  Lab 09/21/20 1753  AST 45*  ALT 24  ALKPHOS 47  BILITOT 1.4*  PROT 8.4*  ALBUMIN 4.7   Recent Labs  Lab 09/21/20 1753  LIPASE 30   No results for input(s): AMMONIA in the last 168 hours. Coagulation Profile: No results for input(s): INR, PROTIME in the last 168 hours. Cardiac Enzymes: No results for input(s): CKTOTAL, CKMB, CKMBINDEX, TROPONINI in the last  168 hours. BNP (last 3 results) No results for input(s): PROBNP in the last 8760 hours. HbA1C: No results for input(s): HGBA1C in the last 72 hours. CBG: No results for input(s): GLUCAP in the last 168 hours. Lipid Profile: No results for input(s): CHOL, HDL, LDLCALC, TRIG, CHOLHDL, LDLDIRECT in the last 72 hours. Thyroid Function Tests: No results for input(s): TSH, T4TOTAL, FREET4, T3FREE, THYROIDAB in the last 72 hours. Anemia Panel: No results for input(s): VITAMINB12, FOLATE, FERRITIN, TIBC, IRON, RETICCTPCT in the last 72 hours. Urine analysis:    Component Value Date/Time   COLORURINE YELLOW 08/16/2020 0631   APPEARANCEUR CLEAR 08/16/2020 0631   LABSPEC 1.013 08/16/2020 0631   PHURINE 6.0 08/16/2020 0631   GLUCOSEU NEGATIVE 08/16/2020 0631   HGBUR NEGATIVE 08/16/2020 0631   BILIRUBINUR NEGATIVE 08/16/2020 0631   BILIRUBINUR negative 07/28/2015 1438   BILIRUBINUR Small 06/15/2014 1841   KETONESUR NEGATIVE 08/16/2020 0631  PROTEINUR NEGATIVE 08/16/2020 0631   UROBILINOGEN 0.2 07/28/2015 1438   UROBILINOGEN 0.2 09/23/2014 1009   NITRITE NEGATIVE 08/16/2020 0631   LEUKOCYTESUR NEGATIVE 08/16/2020 0631    Radiological Exams on Admission - Personally Reviewed: No results found.  Telemetry: Personally reviewed.  Rhythm is sinus tachycardia .  Assessment/Plan Principal Problem:   Upper GI bleeding with acute epigastric pain  Patient presenting with multiple bouts of coffee-ground emesis in the setting of longstanding alcohol abuse Patient currently has epigastric tenderness on examination Concern for alcoholic gastritis or ulceration with active bleeding Hemoglobin is currently normal although this has the potential of downtrending Performing serial CBCs Obtain type and screen Initiating intravenous proton pump inhibitor Hydrating patient with intravenous isotonic fluids Will transfuse with packed red blood cell transfusion if hemoglobin drops below 7 ER provider  has already discussed case with gastroenterology who has graciously agreed to come evaluate the patient the morning of 7/20 for potential endoscopic intervention.  Active Problems:   Alcohol withdrawal (HCC)  Patient is exhibiting substantial tremors and hypertension here in the emergency department Last alcoholic drink was yesterday at noon Concern for impending alcohol withdrawal Initiating CIWA protocol     Type 2 diabetes mellitus without complication, with long-term current use of insulin (HCC)  Placing patient on reduced Lantus regimen due to tenuous oral intake Accu-Cheks before every meal and nightly.   Hemoglobin A1c is 6.6% in June    Essential hypertension  Continue home regimen amlodipine Pressures may be exacerbated by alcohol withdrawal As needed intravenous antihypertensives for markedly elevated blood pressure    Nicotine dependence, cigarettes, uncomplicated  Counseling daily on cessation   Code Status:  Full code Family Communication: deferred   Status is: Observation  The patient remains OBS appropriate and will d/c before 2 midnights.  Dispo: The patient is from: Home              Anticipated d/c is to: Home              Patient currently is not medically stable to d/c.   Difficult to place patient No        Marinda Elk MD [facility] Pager [phone]  If 7PM-7AM, please contact night-coverage [email or web address] Use universal [place] password for that web site. If you do not have the password, please call the hospital operator.  09/21/2020, 11:19 PM

## 2020-09-21 NOTE — ED Notes (Signed)
Patient transported to CT 

## 2020-09-22 ENCOUNTER — Observation Stay (HOSPITAL_COMMUNITY): Payer: Self-pay | Admitting: Certified Registered Nurse Anesthetist

## 2020-09-22 ENCOUNTER — Encounter (HOSPITAL_COMMUNITY)
Admission: EM | Disposition: A | Discharge status: Home / Self Care | Payer: Self-pay | Source: Home / Self Care | Attending: Internal Medicine

## 2020-09-22 ENCOUNTER — Encounter (HOSPITAL_COMMUNITY): Payer: Self-pay | Admitting: Internal Medicine

## 2020-09-22 DIAGNOSIS — R1013 Epigastric pain: Secondary | ICD-10-CM

## 2020-09-22 DIAGNOSIS — K922 Gastrointestinal hemorrhage, unspecified: Secondary | ICD-10-CM

## 2020-09-22 DIAGNOSIS — K2920 Alcoholic gastritis without bleeding: Secondary | ICD-10-CM

## 2020-09-22 DIAGNOSIS — F10230 Alcohol dependence with withdrawal, uncomplicated: Secondary | ICD-10-CM

## 2020-09-22 DIAGNOSIS — K2970 Gastritis, unspecified, without bleeding: Secondary | ICD-10-CM | POA: Diagnosis present

## 2020-09-22 HISTORY — PX: ESOPHAGOGASTRODUODENOSCOPY: SHX5428

## 2020-09-22 LAB — CBC
HCT: 44.2 % (ref 39.0–52.0)
HCT: 45.2 % (ref 39.0–52.0)
Hemoglobin: 14.9 g/dL (ref 13.0–17.0)
Hemoglobin: 15.1 g/dL (ref 13.0–17.0)
MCH: 28.3 pg (ref 26.0–34.0)
MCH: 28.7 pg (ref 26.0–34.0)
MCHC: 33.4 g/dL (ref 30.0–36.0)
MCHC: 33.7 g/dL (ref 30.0–36.0)
MCV: 84.8 fL (ref 80.0–100.0)
MCV: 85.2 fL (ref 80.0–100.0)
Platelets: 321 10*3/uL (ref 150–400)
Platelets: 340 10*3/uL (ref 150–400)
RBC: 5.19 MIL/uL (ref 4.22–5.81)
RBC: 5.33 MIL/uL (ref 4.22–5.81)
RDW: 13.5 % (ref 11.5–15.5)
RDW: 13.6 % (ref 11.5–15.5)
WBC: 10.8 10*3/uL — ABNORMAL HIGH (ref 4.0–10.5)
WBC: 11.2 10*3/uL — ABNORMAL HIGH (ref 4.0–10.5)
nRBC: 0 % (ref 0.0–0.2)
nRBC: 0 % (ref 0.0–0.2)

## 2020-09-22 LAB — CBG MONITORING, ED: Glucose-Capillary: 121 mg/dL — ABNORMAL HIGH (ref 70–99)

## 2020-09-22 LAB — URINALYSIS, ROUTINE W REFLEX MICROSCOPIC
Bacteria, UA: NONE SEEN
Bilirubin Urine: NEGATIVE
Glucose, UA: NEGATIVE mg/dL
Ketones, ur: 5 mg/dL — AB
Leukocytes,Ua: NEGATIVE
Nitrite: NEGATIVE
Protein, ur: NEGATIVE mg/dL
Specific Gravity, Urine: 1.013 (ref 1.005–1.030)
pH: 7 (ref 5.0–8.0)

## 2020-09-22 LAB — COMPREHENSIVE METABOLIC PANEL
ALT: 35 U/L (ref 0–44)
AST: 84 U/L — ABNORMAL HIGH (ref 15–41)
Albumin: 4.1 g/dL (ref 3.5–5.0)
Alkaline Phosphatase: 43 U/L (ref 38–126)
Anion gap: 8 (ref 5–15)
BUN: 13 mg/dL (ref 6–20)
CO2: 28 mmol/L (ref 22–32)
Calcium: 9.4 mg/dL (ref 8.9–10.3)
Chloride: 103 mmol/L (ref 98–111)
Creatinine, Ser: 0.87 mg/dL (ref 0.61–1.24)
GFR, Estimated: >60 mL/min (ref >60)
Glucose, Bld: 116 mg/dL — ABNORMAL HIGH (ref 70–99)
Potassium: 3.4 mmol/L — ABNORMAL LOW (ref 3.5–5.1)
Sodium: 139 mmol/L (ref 135–145)
Total Bilirubin: 1.2 mg/dL (ref 0.3–1.2)
Total Protein: 7.5 g/dL (ref 6.5–8.1)

## 2020-09-22 LAB — MAGNESIUM: Magnesium: 2.3 mg/dL (ref 1.7–2.4)

## 2020-09-22 LAB — LIPASE, BLOOD: Lipase: 39 U/L (ref 11–51)

## 2020-09-22 LAB — PROTIME-INR
INR: 1.1 (ref 0.8–1.2)
Prothrombin Time: 14.3 seconds (ref 11.4–15.2)

## 2020-09-22 LAB — PHOSPHORUS: Phosphorus: 3 mg/dL (ref 2.5–4.6)

## 2020-09-22 LAB — GLUCOSE, CAPILLARY
Glucose-Capillary: 131 mg/dL — ABNORMAL HIGH (ref 70–99)
Glucose-Capillary: 175 mg/dL — ABNORMAL HIGH (ref 70–99)

## 2020-09-22 LAB — APTT: aPTT: 30 seconds (ref 24–36)

## 2020-09-22 SURGERY — EGD (ESOPHAGOGASTRODUODENOSCOPY)
Anesthesia: Monitor Anesthesia Care

## 2020-09-22 MED ORDER — PROPOFOL 500 MG/50ML IV EMUL
INTRAVENOUS | Status: AC
  Filled 2020-09-22: qty 50

## 2020-09-22 MED ORDER — METOPROLOL TARTRATE 50 MG PO TABS
50.0000 mg | ORAL_TABLET | Freq: Once | ORAL | Status: AC
  Administered 2020-09-22: 50 mg via ORAL
  Filled 2020-09-22: qty 1

## 2020-09-22 MED ORDER — POTASSIUM CHLORIDE CRYS ER 20 MEQ PO TBCR
40.0000 meq | EXTENDED_RELEASE_TABLET | Freq: Once | ORAL | Status: AC
  Administered 2020-09-22: 40 meq via ORAL
  Filled 2020-09-22: qty 2

## 2020-09-22 MED ORDER — HYDRALAZINE HCL 20 MG/ML IJ SOLN: 10.0000 mg | Freq: Four times a day (QID) | INTRAMUSCULAR | Status: DC | PRN

## 2020-09-22 MED ORDER — PROPOFOL 10 MG/ML IV BOLUS
INTRAVENOUS | Status: DC | PRN
  Administered 2020-09-22: 30 mg via INTRAVENOUS
  Administered 2020-09-22 (×3): 20 mg via INTRAVENOUS

## 2020-09-22 MED ORDER — SODIUM CHLORIDE 0.9 % IV SOLN: INTRAVENOUS | Status: DC

## 2020-09-22 MED ORDER — INSULIN ASPART 100 UNIT/ML IJ SOLN
0.0000 [IU] | Freq: Three times a day (TID) | INTRAMUSCULAR | Status: DC
  Administered 2020-09-23: 1 [IU] via SUBCUTANEOUS

## 2020-09-22 MED ORDER — INSULIN GLARGINE 100 UNIT/ML ~~LOC~~ SOLN
5.0000 [IU] | Freq: Every day | SUBCUTANEOUS | Status: DC
  Administered 2020-09-22: 5 [IU] via SUBCUTANEOUS
  Filled 2020-09-22: qty 0.05

## 2020-09-22 MED ORDER — PANTOPRAZOLE SODIUM 40 MG PO TBEC
40.0000 mg | DELAYED_RELEASE_TABLET | Freq: Two times a day (BID) | ORAL | Status: DC
  Administered 2020-09-22 – 2020-09-23 (×2): 40 mg via ORAL
  Filled 2020-09-22 (×2): qty 1

## 2020-09-22 MED ORDER — PANTOPRAZOLE SODIUM 40 MG PO TBEC: 40.0000 mg | DELAYED_RELEASE_TABLET | Freq: Every day | ORAL | Status: DC

## 2020-09-22 MED ORDER — PROPOFOL 500 MG/50ML IV EMUL
INTRAVENOUS | Status: DC | PRN
  Administered 2020-09-22: 150 ug/kg/min via INTRAVENOUS

## 2020-09-22 NOTE — Progress Notes (Addendum)
PROGRESS NOTE    David [name]  RDE:[id number] DOB: 05-24-1980 DOA: 09/21/2020 PCP: Grayce Sessions, NP    Brief Narrative:  David [name] was admitted to the hospital with the working diagnosis of acute upper GI bleeding.   40 year old male past medical history for type 2 diabetes mellitus, hypertension and alcohol abuse who presented with abdominal pain and vomiting.  Reported 36 hours of persistent symptoms, positive coffee-ground emesis.  He drinks alcohol on a regular basis.  On his initial physical examination blood pressure 167/109, heart rate 96, respirate 18, temperature 99, oxygen saturation 100%, he had dry mucous membranes, his lungs were clear to auscultation bilaterally, heart S1-S2, present, rhythmic, abdomen was tender at the epigastric region, no rebound or guarding, no lower extremity edema.  Sodium 138, potassium 3.5, chloride 99, bicarb 26, glucose 133, BUN 18, creatinine 0.85, white count 11.0, hemoglobin 15.6, hematocrit 46.6, platelets 370. SARS COVID-19 negative.  Urinalysis specific gravity 1.013. CT of the abdomen with no acute changes.  Patient underwent upper endoscopy showing acute gastritis, positive esophagitis with no bleeding. 6  Assessment & Plan:   Principal Problem:   Upper GI bleeding Active Problems:   Type 2 diabetes mellitus without complication, with long-term current use of insulin (HCC)   Alcohol withdrawal (HCC)   Essential hypertension   Nicotine dependence, cigarettes, uncomplicated   Acute epigastric pain   Gastritis   Acute upper gi bleed due to gastritis. Patient sp EGD, no ulcers but positive gastritis. Continue to advance diet this pm, continue with antiacid therapy with pantoprazole.  2. T2DM. Continue glucose cover and monitoring with insulin sliding scale. Fasting glucose this am 116.  Basal insulin with glargine 5 units.   3. HTN. Continue blood pressure monitoring. Continue blood pressure control with amlodipine.    5. Tobacco and alcohol use. Continue cessation counseling.  No clinical sings of acute withdrawal, continue close monitoring, will hold on CIWA for now.   6. Hypokalemia. Continue K correction with KCl  Status is: Observation  The patient will require care spanning > 2 midnights and should be moved to inpatient because: Inpatient level of care appropriate due to severity of illness  Dispo: The patient is from: Home              Anticipated d/c is to: Home              Patient currently is not medically stable to d/c.   Difficult to place patient No   DVT prophylaxis: Scd   Code Status:    full  Family Communication:   No family at the bedside    Consultants:  GI   Procedures:  EGD   Subjective: Patient is feeling better, his abdominal pain continue to improve, no nausea or vomiting, no chest pain or dyspnea,.   Objective: Vitals:   09/22/20 1200 09/22/20 1430 09/22/20 1452 09/22/20 1536  BP: (!) 165/120 (!) 146/107  (!) 159/112  Pulse: 82 72  74  Resp:  14  16  Temp:   98.4 F (36.9 C) 98.8 F (37.1 C)  TempSrc:   Oral Oral  SpO2:  100%  100%  Weight:      Height:        Intake/Output Summary (Last 24 hours) at 09/22/2020 1602 Last data filed at 09/22/2020 1008 Gross per 24 hour  Intake 2496.1 ml  Output 2175 ml  Net 321.1 ml   Filed Weights   09/21/20 1511  Weight: 66 kg  Examination:   General: Not in pain or dyspnea, deconditioned  Neurology: Awake and alert, non focal  E ENT: mild pallor, no icterus, oral mucosa moist Cardiovascular: No JVD. S1-S2 present, rhythmic, no gallops, rubs, or murmurs. No lower extremity edema. Pulmonary: positive breath sounds bilaterally, adequate air movement, no wheezing, rhonchi or rales. Gastrointestinal. Abdomen soft and non tender Skin. No rashes Musculoskeletal: no joint deformities     Data Reviewed: I have personally reviewed following labs and imaging studies  CBC: Recent Labs  Lab 09/21/20 1753  09/22/20 0040 09/22/20 0430  WBC 11.0* 11.2* 10.8*  NEUTROABS 8.7*  --   --   HGB 15.6 15.1 14.9  HCT 46.6 45.2 44.2  MCV 84.3 84.8 85.2  PLT 370 340 321   Basic Metabolic Panel: Recent Labs  Lab 09/21/20 1753 09/22/20 0430  NA 138 139  K 3.5 3.4*  CL 99 103  CO2 26 28  GLUCOSE 133* 116*  BUN 18 13  CREATININE 0.85 0.87  CALCIUM 10.0 9.4  MG  --  2.3  PHOS  --  3.0   GFR: Estimated Creatinine Clearance: 108.5 mL/min (by C-G formula based on SCr of 0.87 mg/dL). Liver Function Tests: Recent Labs  Lab 09/21/20 1753 09/22/20 0430  AST 45* 84*  ALT 24 35  ALKPHOS 47 43  BILITOT 1.4* 1.2  PROT 8.4* 7.5  ALBUMIN 4.7 4.1   Recent Labs  Lab 09/21/20 1753 09/22/20 0430  LIPASE 30 39   No results for input(s): AMMONIA in the last 168 hours. Coagulation Profile: Recent Labs  Lab 09/22/20 0040  INR 1.1   Cardiac Enzymes: No results for input(s): CKTOTAL, CKMB, CKMBINDEX, TROPONINI in the last 168 hours. BNP (last 3 results) No results for input(s): PROBNP in the last 8760 hours. HbA1C: No results for input(s): HGBA1C in the last 72 hours. CBG: Recent Labs  Lab 09/22/20 0056  GLUCAP 121*   Lipid Profile: No results for input(s): CHOL, HDL, LDLCALC, TRIG, CHOLHDL, LDLDIRECT in the last 72 hours. Thyroid Function Tests: No results for input(s): TSH, T4TOTAL, FREET4, T3FREE, THYROIDAB in the last 72 hours. Anemia Panel: No results for input(s): VITAMINB12, FOLATE, FERRITIN, TIBC, IRON, RETICCTPCT in the last 72 hours.    Radiology Studies: I have reviewed all of the imaging during this hospital visit personally     Scheduled Meds:  amLODipine  10 mg Oral Daily   folic acid  1 mg Oral Daily   gabapentin  300 mg Oral TID   insulin glargine  10 Units Subcutaneous Q2200   multivitamin with minerals  1 tablet Oral Daily   pantoprazole (PROTONIX) IV  40 mg Intravenous Q12H   thiamine  100 mg Oral Daily   Or   thiamine  100 mg Intravenous Daily    Continuous Infusions:   LOS: 0 days        Tereza Gilham Annett Gula, MD

## 2020-09-22 NOTE — Progress Notes (Signed)
Bp 180/129 Dr. Okey Dupre made aware. He is ok with this pressure and will see how anesthesia affects the pressure and no new orders.  Continue to monitor

## 2020-09-22 NOTE — Op Note (Signed)
[facility] Patient Name: David [name] Procedure Date: 09/22/2020 MRN: [id number] Attending MD: Lear Ng , MD Date of Birth: 30-Oct-1980 CSN: [id number] Age: 40 Admit Type: Outpatient Procedure:                Upper GI endoscopy Indications:              Suspected upper gastrointestinal bleeding,                            Coffee-ground emesis Providers:                Lear Ng, MD, Mariana Arn, Ladona Ridgel, Technician, Dellie Catholic Referring MD:             hospital team Medicines:                Propofol per Anesthesia, Monitored Anesthesia Care Complications:            No immediate complications. Estimated Blood Loss:     Estimated blood loss: none. Procedure:                Pre-Anesthesia Assessment:                           - Prior to the procedure, a History and Physical                            was performed, and patient medications and                            allergies were reviewed. The patient's tolerance of                            previous anesthesia was also reviewed. The risks                            and benefits of the procedure and the sedation                            options and risks were discussed with the patient.                            All questions were answered, and informed consent                            was obtained. Prior Anticoagulants: The patient has                            taken no previous anticoagulant or antiplatelet                            agents. ASA Grade Assessment: III - A patient with  severe systemic disease. After reviewing the risks                            and benefits, the patient was deemed in                            satisfactory condition to undergo the procedure.                           After obtaining informed consent, the endoscope was                            passed under direct vision. Throughout the                             procedure, the patient's blood pressure, pulse, and                            oxygen saturations were monitored continuously. The                            [id number]-[id number] ([id number]) was introduced through the                            mouth, and advanced to the second part of duodenum.                            The upper GI endoscopy was accomplished without                            difficulty. The patient tolerated the procedure                            well. Scope In: Scope Out: Findings:      LA Grade C (one or more mucosal breaks continuous between tops of 2 or       more mucosal folds, less than 75% circumference) esophagitis with no       bleeding was found at the gastroesophageal junction.      A small hiatal hernia was present.      The Z-line was found 40 cm from the incisors.      Segmental mild inflammation characterized by congestion (edema) and       erythema was found in the gastric antrum.      The cardia and gastric fundus were normal on retroflexion.      The examined duodenum was normal. Impression:               - LA Grade C reflux esophagitis with no bleeding.                           - Small hiatal hernia.                           - Z-line, 40 cm from the incisors.                           -  Acute gastritis.                           - Normal examined duodenum.                           - No specimens collected. Moderate Sedation:      N/A - MAC procedure Recommendation:           - Advance diet as tolerated and clear liquid diet.                           - Observe patient's clinical course.                           - Use Prilosec (omeprazole) 40 mg PO BID. Procedure Code(s):        --- Professional ---                           [id number], Esophagogastroduodenoscopy, flexible,                            transoral; diagnostic, including collection of                            specimen(s) by brushing or washing, when performed                             (separate procedure) Diagnosis Code(s):        --- Professional ---                           K92.0, Hematemesis                           K29.00, Acute gastritis without bleeding                           K44.9, Diaphragmatic hernia without obstruction or                            gangrene                           K21.00, Gastro-esophageal reflux disease with                            esophagitis, without bleeding CPT copyright 2019 [employer]. All rights reserved. The codes documented in this report are preliminary and upon coder review may  be revised to meet current compliance requirements. Lear Ng, MD 09/22/2020 10:16:52 AM This report has been signed electronically. Number of Addenda: 0

## 2020-09-22 NOTE — Transfer of Care (Signed)
Immediate Anesthesia Transfer of Care Note  Patient: David [name]  Procedure(s) Performed: ESOPHAGOGASTRODUODENOSCOPY (EGD)  Patient Location: Endoscopy Unit  Anesthesia Type:MAC  Level of Consciousness: drowsy  Airway & Oxygen Therapy: Patient Spontanous Breathing and Patient connected to face mask  Post-op Assessment: Report given to RN and Post -op Vital signs reviewed and stable  Post vital signs: Reviewed and stable  Last Vitals:  Vitals Value Taken Time  BP    Temp    Pulse    Resp    SpO2      Last Pain:  Vitals:   09/22/20 0910  TempSrc: Temporal  PainSc: 0-No pain         Complications: No notable events documented.

## 2020-09-22 NOTE — ED Notes (Signed)
Patient is asleep.  

## 2020-09-22 NOTE — Interval H&P Note (Signed)
History and Physical Interval Note:  09/22/2020 9:54 AM  David [name]  has presented today for surgery, with the diagnosis of coffee ground emesis.  The various methods of treatment have been discussed with the patient and family. After consideration of risks, benefits and other options for treatment, the patient has consented to  Procedure(s): ESOPHAGOGASTRODUODENOSCOPY (EGD) (N/A) as a surgical intervention.  The patient's history has been reviewed, patient examined, no change in status, stable for surgery.  I have reviewed the patient's chart and labs.  Questions were answered to the patient's satisfaction.     Shirley Friar

## 2020-09-22 NOTE — Anesthesia Preprocedure Evaluation (Signed)
Anesthesia Evaluation  Patient identified by MRN, date of birth, ID band Patient awake    Reviewed: Allergy & Precautions, NPO status , Patient's Chart, lab work & pertinent test results  Airway Mallampati: II  TM Distance: >3 FB Neck ROM: Full    Dental no notable dental hx.    Pulmonary neg pulmonary ROS, Current Smoker and Patient abstained from smoking.,    Pulmonary exam normal breath sounds clear to auscultation       Cardiovascular hypertension, Normal cardiovascular exam Rhythm:Regular Rate:Normal  Uncontrolled HTN   Neuro/Psych negative neurological ROS  negative psych ROS   GI/Hepatic negative GI ROS, (+)     substance abuse  alcohol use,   Endo/Other  diabetes, Insulin Dependent  Renal/GU negative Renal ROS  negative genitourinary   Musculoskeletal negative musculoskeletal ROS (+) narcotic dependent  Abdominal   Peds negative pediatric ROS (+)  Hematology negative hematology ROS (+)   Anesthesia Other Findings   Reproductive/Obstetrics negative OB ROS                             Anesthesia Physical Anesthesia Plan  ASA: 3  Anesthesia Plan: MAC   Post-op Pain Management:    Induction: Intravenous  PONV Risk Score and Plan: 1 and Propofol infusion and Treatment may vary due to age or medical condition  Airway Management Planned: Simple Face Mask  Additional Equipment:   Intra-op Plan:   Post-operative Plan:   Informed Consent: I have reviewed the patients History and Physical, chart, labs and discussed the procedure including the risks, benefits and alternatives for the proposed anesthesia with the patient or authorized representative who has indicated his/her understanding and acceptance.     Dental advisory given  Plan Discussed with: CRNA and Surgeon  Anesthesia Plan Comments:         Anesthesia Quick Evaluation

## 2020-09-22 NOTE — Consult Note (Signed)
Referring Provider: [facility] Primary Care Physician:  Grayce Sessions, NP Primary Gastroenterologist:  Gentry Fitz  Reason for Consultation:  Hematemesis  HPI: David [name] is a 40 y.o. male with past medical history of DM type 2 and alcohol use presenting for consultation of hematemesis.  Patient presented to the ED last night after having multiple episodes of emesis.  He states he initially was vomiting clear liquids, but then started vomiting dark blood.  He reports prior history of intermittent nausea and vomiting.  However, denies prior history of hematemesis.  Reports past history of melena, though states he does not currently have any melena.  Denies any changes in stool or hematochezia.  Denies abdominal pain, dysphagia, changes in appetite.  States he thinks he has lost some weight over the past few days but is unsure.  He drinks alcohol daily, several shots of liquor up to a fifth of liquor per day.  Denies NSAID, aspirin, or blood thinner use.  He has never had an EGD or colonoscopy.  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 Authorizing Provider  amLODipine (NORVASC) 10 MG tablet Take 1 tablet (10 mg total) by mouth daily. 06/14/20  Yes Grayce Sessions, NP  insulin glargine (LANTUS) 100 UNIT/ML Solostar Pen Inject 15 Units into the skin 2 (two) times daily. 08/16/20  Yes Erick Blinks, MD  gabapentin (NEURONTIN) 300 MG capsule Take 1 capsule (300 mg total) by mouth 3 (three) times daily. Patient not taking: Reported on 09/22/2020 04/18/17   Anders Simmonds, PA-C  Insulin Pen Needle (PEN NEEDLES 31GX5/16") 31G X 8 MM MISC As directed  08/16/20   Erick Blinks, MD  loperamide (IMODIUM A-D) 2 MG tablet Take 1 tablet (2 mg total) by mouth 2 (two) times daily as needed for diarrhea or loose stools. Patient not taking: Reported on 09/22/2020 08/16/20   Erick Blinks, MD  ondansetron (ZOFRAN ODT) 4 MG disintegrating tablet Take 1 tablet (4 mg total) by mouth every 8 (eight) hours as needed for nausea or vomiting. Patient not taking: Reported on 09/22/2020 08/16/20   Erick Blinks, MD  potassium chloride SA (KLOR-CON) 20 MEQ tablet Take 1 tablet (20 mEq total) by mouth daily. Patient not taking: Reported on 09/22/2020 08/16/20   Erick Blinks, MD    Scheduled Meds:  amLODipine  10 mg Oral Daily   folic acid  1 mg Oral Daily   gabapentin  300 mg Oral TID   insulin glargine  10 Units Subcutaneous Q2200   multivitamin with minerals  1 tablet Oral Daily   pantoprazole (PROTONIX) IV  40 mg Intravenous Q12H   thiamine  100 mg Oral Daily   Or   thiamine  100 mg Intravenous Daily   Continuous Infusions:  lactated ringers 125 mL/hr at 09/22/20 0033   PRN Meds:.acetaminophen **OR** acetaminophen, hydrALAZINE, LORazepam **OR** LORazepam, ondansetron **OR** ondansetron (ZOFRAN) IV, polyethylene glycol  Allergies as of 09/21/2020   (No Known Allergies)    Family History  Problem Relation Age of Onset   Diabetes Father     Social History  Socioeconomic History   Marital status: Married    Spouse name: Not on file   Number of children: Not on file   Years of education: Not on file   Highest education level: Not on file  Occupational History   Not on file  Tobacco Use   Smoking status: Every Day    Packs/day: 1.00    Years: 10.00    Pack years: 10.00    Types: Cigarettes   Smokeless tobacco: Never  Vaping Use   Vaping Use: Never used  Substance and Sexual Activity   Alcohol use: Yes    Alcohol/week: 30.0 standard drinks    Types: 30 Shots of liquor per week    Comment: minimum 4 shots of liquor daily   Drug  use: Yes    Types: Oxycodone, Marijuana   Sexual activity: Yes    Birth control/protection: Condom  Other Topics Concern   Not on file  Social History Narrative   Not on file   Social Determinants of Health   Financial Resource Strain: Not on file  Food Insecurity: Not on file  Transportation Needs: Not on file  Physical Activity: Not on file  Stress: Not on file  Social Connections: Not on file  Intimate Partner Violence: Not on file    Review of Systems: Review of Systems  Constitutional:  Negative for chills and fever.  HENT:  Negative for hearing loss and tinnitus.   Eyes:  Negative for pain and redness.  Respiratory:  Negative for shortness of breath and wheezing.   Cardiovascular:  Negative for chest pain and palpitations.  Gastrointestinal:  Positive for nausea and vomiting. Negative for abdominal pain, blood in stool, constipation, diarrhea, heartburn and melena.  Genitourinary:  Negative for flank pain and hematuria.  Musculoskeletal:  Negative for falls and joint pain.  Skin:  Negative for itching and rash.  Neurological:  Negative for loss of consciousness and weakness.  Psychiatric/Behavioral:  Positive for substance abuse. The patient is not nervous/anxious.    Physical Exam: Vital signs: Vitals:   09/22/20 0500 09/22/20 0600  BP: (!) 165/101 (!) 142/108  Pulse: 91 79  Resp: 17 16  Temp:    SpO2: 90% 97%      Physical Exam Vitals reviewed.  Constitutional:      General: He is not in acute distress. HENT:     Head: Normocephalic and atraumatic.     Nose: Nose normal. No congestion.     Mouth/Throat:     Mouth: Mucous membranes are moist.     Pharynx: Oropharynx is clear.  Eyes:     General: No scleral icterus.    Extraocular Movements: Extraocular movements intact.     Conjunctiva/sclera: Conjunctivae normal.  Cardiovascular:     Rate and Rhythm: Normal rate and regular rhythm.  Pulmonary:     Effort: Pulmonary effort is normal. No respiratory  distress.  Abdominal:     General: There is no distension.     Palpations: There is no mass.     Tenderness: There is no abdominal tenderness. There is no guarding or rebound.     Hernia: No hernia is present.  Musculoskeletal:        General: No swelling or tenderness.     Cervical back: Normal range of motion and neck supple.  Skin:    General: Skin is warm and dry.  Neurological:     General: No focal deficit present.     Mental Status: He is alert and oriented  to person, place, and time.  Psychiatric:        Mood and Affect: Mood normal.        Behavior: Behavior normal. Behavior is cooperative.    GI:  Lab Results: Recent Labs    09/21/20 1753 09/22/20 0040 09/22/20 0430  WBC 11.0* 11.2* 10.8*  HGB 15.6 15.1 14.9  HCT 46.6 45.2 44.2  PLT 370 340 321   BMET Recent Labs    09/21/20 1753 09/22/20 0430  NA 138 139  K 3.5 3.4*  CL 99 103  CO2 26 28  GLUCOSE 133* 116*  BUN 18 13  CREATININE 0.85 0.87  CALCIUM 10.0 9.4   LFT Recent Labs    09/22/20 0430  PROT 7.5  ALBUMIN 4.1  AST 84*  ALT 35  ALKPHOS 43  BILITOT 1.2   PT/INR Recent Labs    09/22/20 0040  LABPROT 14.3  INR 1.1     Studies/Results: CT ABDOMEN PELVIS W CONTRAST  Result Date: 09/21/2020 CLINICAL DATA:  Vomiting. Pancreatitis suspected. Daily alcohol consumption. EXAM: CT ABDOMEN AND PELVIS WITH CONTRAST TECHNIQUE: Multidetector CT imaging of the abdomen and pelvis was performed using the standard protocol following bolus administration of intravenous contrast. CONTRAST:  49mL OMNIPAQUE IOHEXOL 350 MG/ML SOLN COMPARISON:  Noncontrast CT 08/15/2020 FINDINGS: Lower chest: No basilar airspace disease or pleural effusion. There is minimal wall thickening of the distal esophagus. Hepatobiliary: No focal liver abnormality is seen. No gallstones, gallbladder wall thickening, or biliary dilatation. Pancreas: No peripancreatic fat stranding or inflammation. No ductal dilatation. No focal  pancreatic abnormality. Homogeneous pancreatic attenuation. Spleen: Normal in size without focal abnormality. Adrenals/Urinary Tract: Normal adrenal glands. No hydronephrosis or perinephric edema. Homogeneous renal enhancement. No visualized renal calculi. Urinary bladder is physiologically distended without wall thickening. Stomach/Bowel: Mild wall thickening of the distal esophagus. Stomach is decompressed, grossly unremarkable. Normal positioning of the duodenum and ligament Treitz. There is no small bowel obstruction or inflammatory change. Normal appendix. Small volume of colonic stool without colonic inflammation. Vascular/Lymphatic: Normal caliber abdominal aorta. Patent portal and splenic veins. Patent mesenteric vasculature. No portal venous or mesenteric gas. No abdominopelvic adenopathy. Reproductive: Prostate is unremarkable. Other: No free air or ascites. Diminutive fat containing umbilical hernia. Musculoskeletal: There are no acute or suspicious osseous abnormalities. IMPRESSION: 1. No CT findings of acute pancreatitis. 2. Mild wall thickening of the distal esophagus, can be seen with reflux or esophagitis. Electronically Signed   By: Narda Rutherford M.D.   On: 09/21/2020 23:39    Impression: Hematemesis: Mallory-Weiss tear versus esophagitis versus gastritis/PUD -Normal hemoglobin, 14.9 -Normal renal function (BUN 13/creatinine 0.87)  Daily alcohol use -Elevated AST (84).  Otherwise normal LFTs -No liver abnormality on CT  Plan: EGD today for further evaluation.  I thoroughly discussed procedure with patient to include nature, alternatives, benefits, and risks (including but not limited to bleeding, infection, perforation, anesthesia/cardiac and pulmonary complications).  Patient verbalized understanding gave verbal consent to proceed with EGD.  Continue Protonix twice daily.  Continue supportive care.  [facility] will follow.   LOS: 0 days   Edrick Kins   PA-C 09/22/2020, 8:44 AM  Contact #  [phone]

## 2020-09-22 NOTE — Anesthesia Postprocedure Evaluation (Signed)
Anesthesia Post Note  Patient: David [name]  Procedure(s) Performed: ESOPHAGOGASTRODUODENOSCOPY (EGD)     Patient location during evaluation: PACU Anesthesia Type: MAC Level of consciousness: awake and alert Pain management: pain level controlled Vital Signs Assessment: post-procedure vital signs reviewed and stable Respiratory status: spontaneous breathing, nonlabored ventilation, respiratory function stable and patient connected to nasal cannula oxygen Cardiovascular status: stable and blood pressure returned to baseline Postop Assessment: no apparent nausea or vomiting Anesthetic complications: no   No notable events documented.  Last Vitals:  Vitals:   09/22/20 1105 09/22/20 1200  BP: (!) 167/118 (!) 165/120  Pulse: 81 82  Resp: 12   Temp:    SpO2: 100%     Last Pain:  Vitals:   09/22/20 1040  TempSrc:   PainSc: 0-No pain                 Jasma Seevers S

## 2020-09-22 NOTE — Plan of Care (Signed)

## 2020-09-22 NOTE — H&P (View-Only) (Signed)
Referring Provider: [facility] Primary Care Physician:  Grayce Sessions, NP Primary Gastroenterologist:  Gentry Fitz  Reason for Consultation:  Hematemesis  HPI: David [name] is a 40 y.o. male with past medical history of DM type 2 and alcohol use presenting for consultation of hematemesis.  Patient presented to the ED last night after having multiple episodes of emesis.  He states he initially was vomiting clear liquids, but then started vomiting dark blood.  He reports prior history of intermittent nausea and vomiting.  However, denies prior history of hematemesis.  Reports past history of melena, though states he does not currently have any melena.  Denies any changes in stool or hematochezia.  Denies abdominal pain, dysphagia, changes in appetite.  States he thinks he has lost some weight over the past few days but is unsure.  He drinks alcohol daily, several shots of liquor up to a fifth of liquor per day.  Denies NSAID, aspirin, or blood thinner use.  He has never had an EGD or colonoscopy.  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 Authorizing Provider  amLODipine (NORVASC) 10 MG tablet Take 1 tablet (10 mg total) by mouth daily. 06/14/20  Yes Grayce Sessions, NP  insulin glargine (LANTUS) 100 UNIT/ML Solostar Pen Inject 15 Units into the skin 2 (two) times daily. 08/16/20  Yes Erick Blinks, MD  gabapentin (NEURONTIN) 300 MG capsule Take 1 capsule (300 mg total) by mouth 3 (three) times daily. Patient not taking: Reported on 09/22/2020 04/18/17   Anders Simmonds, PA-C  Insulin Pen Needle (PEN NEEDLES 31GX5/16") 31G X 8 MM MISC As directed  08/16/20   Erick Blinks, MD  loperamide (IMODIUM A-D) 2 MG tablet Take 1 tablet (2 mg total) by mouth 2 (two) times daily as needed for diarrhea or loose stools. Patient not taking: Reported on 09/22/2020 08/16/20   Erick Blinks, MD  ondansetron (ZOFRAN ODT) 4 MG disintegrating tablet Take 1 tablet (4 mg total) by mouth every 8 (eight) hours as needed for nausea or vomiting. Patient not taking: Reported on 09/22/2020 08/16/20   Erick Blinks, MD  potassium chloride SA (KLOR-CON) 20 MEQ tablet Take 1 tablet (20 mEq total) by mouth daily. Patient not taking: Reported on 09/22/2020 08/16/20   Erick Blinks, MD    Scheduled Meds:  amLODipine  10 mg Oral Daily   folic acid  1 mg Oral Daily   gabapentin  300 mg Oral TID   insulin glargine  10 Units Subcutaneous Q2200   multivitamin with minerals  1 tablet Oral Daily   pantoprazole (PROTONIX) IV  40 mg Intravenous Q12H   thiamine  100 mg Oral Daily   Or   thiamine  100 mg Intravenous Daily   Continuous Infusions:  lactated ringers 125 mL/hr at 09/22/20 0033   PRN Meds:.acetaminophen **OR** acetaminophen, hydrALAZINE, LORazepam **OR** LORazepam, ondansetron **OR** ondansetron (ZOFRAN) IV, polyethylene glycol  Allergies as of 09/21/2020   (No Known Allergies)    Family History  Problem Relation Age of Onset   Diabetes Father     Social History  Socioeconomic History   Marital status: Married    Spouse name: Not on file   Number of children: Not on file   Years of education: Not on file   Highest education level: Not on file  Occupational History   Not on file  Tobacco Use   Smoking status: Every Day    Packs/day: 1.00    Years: 10.00    Pack years: 10.00    Types: Cigarettes   Smokeless tobacco: Never  Vaping Use   Vaping Use: Never used  Substance and Sexual Activity   Alcohol use: Yes    Alcohol/week: 30.0 standard drinks    Types: 30 Shots of liquor per week    Comment: minimum 4 shots of liquor daily   Drug  use: Yes    Types: Oxycodone, Marijuana   Sexual activity: Yes    Birth control/protection: Condom  Other Topics Concern   Not on file  Social History Narrative   Not on file   Social Determinants of Health   Financial Resource Strain: Not on file  Food Insecurity: Not on file  Transportation Needs: Not on file  Physical Activity: Not on file  Stress: Not on file  Social Connections: Not on file  Intimate Partner Violence: Not on file    Review of Systems: Review of Systems  Constitutional:  Negative for chills and fever.  HENT:  Negative for hearing loss and tinnitus.   Eyes:  Negative for pain and redness.  Respiratory:  Negative for shortness of breath and wheezing.   Cardiovascular:  Negative for chest pain and palpitations.  Gastrointestinal:  Positive for nausea and vomiting. Negative for abdominal pain, blood in stool, constipation, diarrhea, heartburn and melena.  Genitourinary:  Negative for flank pain and hematuria.  Musculoskeletal:  Negative for falls and joint pain.  Skin:  Negative for itching and rash.  Neurological:  Negative for loss of consciousness and weakness.  Psychiatric/Behavioral:  Positive for substance abuse. The patient is not nervous/anxious.    Physical Exam: Vital signs: Vitals:   09/22/20 0500 09/22/20 0600  BP: (!) 165/101 (!) 142/108  Pulse: 91 79  Resp: 17 16  Temp:    SpO2: 90% 97%      Physical Exam Vitals reviewed.  Constitutional:      General: He is not in acute distress. HENT:     Head: Normocephalic and atraumatic.     Nose: Nose normal. No congestion.     Mouth/Throat:     Mouth: Mucous membranes are moist.     Pharynx: Oropharynx is clear.  Eyes:     General: No scleral icterus.    Extraocular Movements: Extraocular movements intact.     Conjunctiva/sclera: Conjunctivae normal.  Cardiovascular:     Rate and Rhythm: Normal rate and regular rhythm.  Pulmonary:     Effort: Pulmonary effort is normal. No respiratory  distress.  Abdominal:     General: There is no distension.     Palpations: There is no mass.     Tenderness: There is no abdominal tenderness. There is no guarding or rebound.     Hernia: No hernia is present.  Musculoskeletal:        General: No swelling or tenderness.     Cervical back: Normal range of motion and neck supple.  Skin:    General: Skin is warm and dry.  Neurological:     General: No focal deficit present.     Mental Status: He is alert and oriented  to person, place, and time.  Psychiatric:        Mood and Affect: Mood normal.        Behavior: Behavior normal. Behavior is cooperative.    GI:  Lab Results: Recent Labs    09/21/20 1753 09/22/20 0040 09/22/20 0430  WBC 11.0* 11.2* 10.8*  HGB 15.6 15.1 14.9  HCT 46.6 45.2 44.2  PLT 370 340 321   BMET Recent Labs    09/21/20 1753 09/22/20 0430  NA 138 139  K 3.5 3.4*  CL 99 103  CO2 26 28  GLUCOSE 133* 116*  BUN 18 13  CREATININE 0.85 0.87  CALCIUM 10.0 9.4   LFT Recent Labs    09/22/20 0430  PROT 7.5  ALBUMIN 4.1  AST 84*  ALT 35  ALKPHOS 43  BILITOT 1.2   PT/INR Recent Labs    09/22/20 0040  LABPROT 14.3  INR 1.1     Studies/Results: CT ABDOMEN PELVIS W CONTRAST  Result Date: 09/21/2020 CLINICAL DATA:  Vomiting. Pancreatitis suspected. Daily alcohol consumption. EXAM: CT ABDOMEN AND PELVIS WITH CONTRAST TECHNIQUE: Multidetector CT imaging of the abdomen and pelvis was performed using the standard protocol following bolus administration of intravenous contrast. CONTRAST:  49mL OMNIPAQUE IOHEXOL 350 MG/ML SOLN COMPARISON:  Noncontrast CT 08/15/2020 FINDINGS: Lower chest: No basilar airspace disease or pleural effusion. There is minimal wall thickening of the distal esophagus. Hepatobiliary: No focal liver abnormality is seen. No gallstones, gallbladder wall thickening, or biliary dilatation. Pancreas: No peripancreatic fat stranding or inflammation. No ductal dilatation. No focal  pancreatic abnormality. Homogeneous pancreatic attenuation. Spleen: Normal in size without focal abnormality. Adrenals/Urinary Tract: Normal adrenal glands. No hydronephrosis or perinephric edema. Homogeneous renal enhancement. No visualized renal calculi. Urinary bladder is physiologically distended without wall thickening. Stomach/Bowel: Mild wall thickening of the distal esophagus. Stomach is decompressed, grossly unremarkable. Normal positioning of the duodenum and ligament Treitz. There is no small bowel obstruction or inflammatory change. Normal appendix. Small volume of colonic stool without colonic inflammation. Vascular/Lymphatic: Normal caliber abdominal aorta. Patent portal and splenic veins. Patent mesenteric vasculature. No portal venous or mesenteric gas. No abdominopelvic adenopathy. Reproductive: Prostate is unremarkable. Other: No free air or ascites. Diminutive fat containing umbilical hernia. Musculoskeletal: There are no acute or suspicious osseous abnormalities. IMPRESSION: 1. No CT findings of acute pancreatitis. 2. Mild wall thickening of the distal esophagus, can be seen with reflux or esophagitis. Electronically Signed   By: Narda Rutherford M.D.   On: 09/21/2020 23:39    Impression: Hematemesis: Mallory-Weiss tear versus esophagitis versus gastritis/PUD -Normal hemoglobin, 14.9 -Normal renal function (BUN 13/creatinine 0.87)  Daily alcohol use -Elevated AST (84).  Otherwise normal LFTs -No liver abnormality on CT  Plan: EGD today for further evaluation.  I thoroughly discussed procedure with patient to include nature, alternatives, benefits, and risks (including but not limited to bleeding, infection, perforation, anesthesia/cardiac and pulmonary complications).  Patient verbalized understanding gave verbal consent to proceed with EGD.  Continue Protonix twice daily.  Continue supportive care.  [facility] will follow.   LOS: 0 days   Edrick Kins   PA-C 09/22/2020, 8:44 AM  Contact #  [phone]

## 2020-09-22 NOTE — Brief Op Note (Signed)
Mild esophagitis, gastritis and a small hiatal hernia. Suspect coffee grounds emesis due to esophagitis. Advance diet. Ok to go home today if tolerating diet. Advised to stop drinking alcohol. PPI PO BID at discharge for 1 month and then QD. F/U with me in 6-8 weeks.

## 2020-09-22 NOTE — Anesthesia Procedure Notes (Signed)
Procedure Name: MAC Date/Time: 09/22/2020 9:55 AM Performed by: Claudia Desanctis, CRNA Pre-anesthesia Checklist: Patient identified, Emergency Drugs available, Suction available and Patient being monitored Patient Re-evaluated:Patient Re-evaluated prior to induction Oxygen Delivery Method: Simple face mask

## 2020-09-22 NOTE — Progress Notes (Signed)
Pt expressed that to me that he really wants to be discharged during change of shift with day RN, both myself and day RN [place] advised that the his doctors wanted to keep him here for observation overnight and he would most likely be discharged in the morning. He expressed just now however that he really wants to go, not AMA, but would like to be discharged as soon as possible if able.

## 2020-09-22 NOTE — ED Notes (Signed)
apple juice and iced water provided.

## 2020-09-22 NOTE — ED Notes (Signed)
Patient received sandwich and sprite.

## 2020-09-23 DIAGNOSIS — Z794 Long term (current) use of insulin: Secondary | ICD-10-CM

## 2020-09-23 DIAGNOSIS — F17210 Nicotine dependence, cigarettes, uncomplicated: Secondary | ICD-10-CM

## 2020-09-23 DIAGNOSIS — E119 Type 2 diabetes mellitus without complications: Secondary | ICD-10-CM

## 2020-09-23 DIAGNOSIS — I10 Essential (primary) hypertension: Secondary | ICD-10-CM

## 2020-09-23 LAB — HEMOGLOBIN AND HEMATOCRIT, BLOOD
HCT: 45.7 % (ref 39.0–52.0)
Hemoglobin: 15.4 g/dL (ref 13.0–17.0)

## 2020-09-23 LAB — GLUCOSE, CAPILLARY: Glucose-Capillary: 138 mg/dL — ABNORMAL HIGH (ref 70–99)

## 2020-09-23 MED ORDER — OMEPRAZOLE MAGNESIUM 20 MG PO TBEC: 40.0000 mg | DELAYED_RELEASE_TABLET | Freq: Two times a day (BID) | ORAL | 0 refills | Status: DC

## 2020-09-23 NOTE — Discharge Summary (Signed)
Physician Discharge Summary  David [name] ION:[id number] DOB: Nov 12, 1980 DOA: 09/21/2020  PCP: Grayce Sessions, NP  Admit date: 09/21/2020 Discharge date: 09/23/2020  Admitted From: Home  Disposition:  Home   Recommendations for Outpatient Follow-up and new medication changes:  Follow up with Gwinda Passe P NP in 7 to 10 days.  Started on omeprazole 40 mg po bid Avoid alcohol.   Home Health: no   Equipment/Devices: no    Discharge Condition: stable  CODE STATUS: full  Diet recommendation:  heart healthy and diabetic prudent.   Brief/Interim Summary: David [name] was admitted to the hospital with the working diagnosis of acute upper GI bleeding due to acute gastritis.    40 year old male past medical history for type 2 diabetes mellitus, hypertension and alcohol abuse who presented with abdominal pain and vomiting.  Reported 36 hours of persistent symptoms, positive coffee-ground emesis.  He drinks alcohol on a regular basis.  On his initial physical examination blood pressure 167/109, heart rate 96, respiratory rate 18, temperature 99, oxygen saturation 100%, he had dry mucous membranes, his lungs were clear to auscultation bilaterally, heart S1-S2, present, rhythmic, abdomen was tender at the epigastric region, no rebound or guarding, no lower extremity edema.   Sodium 138, potassium 3.5, chloride 99, bicarb 26, glucose 133, BUN 18, creatinine 0.85, white count 11.0, hemoglobin 15.6, hematocrit 46.6, platelets 370. SARS COVID-19 negative.   Urinalysis specific gravity 1.013. CT of the abdomen with no acute changes.   Patient underwent upper endoscopy showing acute gastritis, positive esophagitis with no bleeding. Placed on proton pump inhibitor with improvement of his symptoms.   Acute upper gastrointestinal bleed due to acute gastritis.  Patient was placed on proton pump inhibitors intravenously along with posterior analgesics and antiemetics. Upper endoscopy showed large  grade C reflux esophagitis with no bleeding, small hiatal hernia and acute gastritis.  Recommendations to continue proton pump inhibitor, omeprazole 40 mg twice daily.  Plan to follow-up as an outpatient. His diet was advanced with good toleration, his symptoms remarkably improved. His discharge hemoglobin is 15.4, hematocrit 45.7. (Did not require PRBC transfusion).   2.  Type 2 diabetes mellitus.  Patient was placed on insulin sliding scale for glucose coverage and monitoring, basal insulin, lower dose than usual. At his discharge he will resume his usual insulin regimen.  3.  Hypokalemia.  Related to dehydration, received potassium chloride with good toleration.  4.  Tobacco and alcohol use.  He was counseled about cessation.  No signs of acute withdrawal during this hospitalization.   Discharge Diagnoses:  Principal Problem:   Upper GI bleeding Active Problems:   Type 2 diabetes mellitus without complication, with long-term current use of insulin (HCC)   Alcohol withdrawal (HCC)   Essential hypertension   Nicotine dependence, cigarettes, uncomplicated   Acute epigastric pain   Gastritis    Discharge Instructions   Allergies as of 09/23/2020   No Known Allergies      Medication List     STOP taking these medications    gabapentin 300 MG capsule Commonly known as: NEURONTIN   loperamide 2 MG tablet Commonly known as: Imodium A-D   ondansetron 4 MG disintegrating tablet Commonly known as: Zofran ODT   potassium chloride SA 20 MEQ tablet Commonly known as: KLOR-CON       TAKE these medications    amLODipine 10 MG tablet Commonly known as: NORVASC Take 1 tablet (10 mg total) by mouth daily.   insulin glargine 100 UNIT/ML Solostar  Pen Commonly known as: LANTUS Inject 15 Units into the skin 2 (two) times daily.   omeprazole 20 MG tablet Commonly known as: PriLOSEC OTC Take 2 tablets (40 mg total) by mouth 2 (two) times daily.   PEN NEEDLES 31GX5/16"  31G X 8 MM Misc As directed        Follow-up Information     Charlott Rakes, MD. Call.   Specialty: Gastroenterology Why: Call to schedule 2 month follow up appointment. Contact information: [address] [place] [address] [phone]         Grayce Sessions, NP Follow up in 1 week(s).   Specialty: Internal Medicine Contact information: [address] [place] [address] [phone]                No Known Allergies  Consultations: GI    Procedures/Studies: CT ABDOMEN PELVIS W CONTRAST  Result Date: 09/21/2020 CLINICAL DATA:  Vomiting. Pancreatitis suspected. Daily alcohol consumption. EXAM: CT ABDOMEN AND PELVIS WITH CONTRAST TECHNIQUE: Multidetector CT imaging of the abdomen and pelvis was performed using the standard protocol following bolus administration of intravenous contrast. CONTRAST:  78mL OMNIPAQUE IOHEXOL 350 MG/ML SOLN COMPARISON:  Noncontrast CT 08/15/2020 FINDINGS: Lower chest: No basilar airspace disease or pleural effusion. There is minimal wall thickening of the distal esophagus. Hepatobiliary: No focal liver abnormality is seen. No gallstones, gallbladder wall thickening, or biliary dilatation. Pancreas: No peripancreatic fat stranding or inflammation. No ductal dilatation. No focal pancreatic abnormality. Homogeneous pancreatic attenuation. Spleen: Normal in size without focal abnormality. Adrenals/Urinary Tract: Normal adrenal glands. No hydronephrosis or perinephric edema. Homogeneous renal enhancement. No visualized renal calculi. Urinary bladder is physiologically distended without wall thickening. Stomach/Bowel: Mild wall thickening of the distal esophagus. Stomach is decompressed, grossly unremarkable. Normal positioning of the duodenum and ligament Treitz. There is no small bowel obstruction or inflammatory change. Normal appendix. Small volume of colonic stool without colonic inflammation. Vascular/Lymphatic:  Normal caliber abdominal aorta. Patent portal and splenic veins. Patent mesenteric vasculature. No portal venous or mesenteric gas. No abdominopelvic adenopathy. Reproductive: Prostate is unremarkable. Other: No free air or ascites. Diminutive fat containing umbilical hernia. Musculoskeletal: There are no acute or suspicious osseous abnormalities. IMPRESSION: 1. No CT findings of acute pancreatitis. 2. Mild wall thickening of the distal esophagus, can be seen with reflux or esophagitis. Electronically Signed   By: Narda Rutherford M.D.   On: 09/21/2020 23:39     Procedures: Upper endoscopy   Subjective: Patient is feeling better, no nausea or vomiting, no chest pain or dyspnea. No tremors or anxiety.   Discharge Exam: Vitals:   09/22/20 2331 09/23/20 0352  BP: 116/89 (!) 127/103  Pulse: (!) 104 95  Resp: 18 18  Temp: 98.9 F (37.2 C) 99.1 F (37.3 C)  SpO2: 99% 98%   Vitals:   09/22/20 1536 09/22/20 1944 09/22/20 2331 09/23/20 0352  BP: (!) 159/112 (!) 120/107 116/89 (!) 127/103  Pulse: 74 (!) 107 (!) 104 95  Resp: 16 18 18 18   Temp: 98.8 F (37.1 C) 98.9 F (37.2 C) 98.9 F (37.2 C) 99.1 F (37.3 C)  TempSrc: Oral Oral Oral Oral  SpO2: 100% 99% 99% 98%  Weight:      Height:        General: Not in pain or dyspnea.  Neurology: Awake and alert, non focal  E ENT: no pallor, no icterus, oral mucosa moist Cardiovascular: No JVD. S1-S2 present, rhythmic, no gallops, rubs, or murmurs. No lower extremity edema.  Pulmonary: vesicular breath sounds bilaterally, adequate air movement, no wheezing, rhonchi or rales. Gastrointestinal. Abdomen soft and non tender Skin. No rashes Musculoskeletal: no joint deformities   The results of significant diagnostics from this hospitalization (including imaging, microbiology, ancillary and laboratory) are listed below for reference.     Microbiology: Recent Results (from the past 240 hour(s))  Resp Panel by RT-PCR (Flu A&B, Covid)  Nasopharyngeal Swab     Status: None   Collection Time: 09/21/20  7:45 PM   Specimen: Nasopharyngeal Swab; Nasopharyngeal(NP) swabs in vial transport medium  Result Value Ref Range Status   SARS Coronavirus 2 by RT PCR NEGATIVE NEGATIVE Final    Comment: (NOTE) SARS-CoV-2 target nucleic acids are NOT DETECTED.  The SARS-CoV-2 RNA is generally detectable in upper respiratory specimens during the acute phase of infection. The lowest concentration of SARS-CoV-2 viral copies this assay can detect is 138 copies/mL. A negative result does not preclude SARS-Cov-2 infection and should not be used as the sole basis for treatment or other patient management decisions. A negative result may occur with  improper specimen collection/handling, submission of specimen other than nasopharyngeal swab, presence of viral mutation(s) within the areas targeted by this assay, and inadequate number of viral copies(<138 copies/mL). A negative result must be combined with clinical observations, patient history, and epidemiological information. The expected result is Negative.  Fact Sheet for Patients:  BloggerCourse.com  Fact Sheet for Healthcare Providers:  SeriousBroker.it  This test is no t yet approved or cleared by the [place] FDA and  has been authorized for detection and/or diagnosis of SARS-CoV-2 by FDA under an Emergency Use Authorization (EUA). This EUA will remain  in effect (meaning this test can be used) for the duration of the COVID-19 declaration under Section 564(b)(1) of the Act, 21 U.S.C.section 360bbb-3(b)(1), unless the authorization is terminated  or revoked sooner.       Influenza A by PCR NEGATIVE NEGATIVE Final   Influenza B by PCR NEGATIVE NEGATIVE Final    Comment: (NOTE) The Xpert Xpress SARS-CoV-2/FLU/RSV plus assay is intended as an aid in the diagnosis of influenza from Nasopharyngeal swab specimens and should not be  used as a sole basis for treatment. Nasal washings and aspirates are unacceptable for Xpert Xpress SARS-CoV-2/FLU/RSV testing.  Fact Sheet for Patients: BloggerCourse.com  Fact Sheet for Healthcare Providers: SeriousBroker.it  This test is not yet approved or cleared by the [place] FDA and has been authorized for detection and/or diagnosis of SARS-CoV-2 by FDA under an Emergency Use Authorization (EUA). This EUA will remain in effect (meaning this test can be used) for the duration of the COVID-19 declaration under Section 564(b)(1) of the Act, 21 U.S.C. section 360bbb-3(b)(1), unless the authorization is terminated or revoked.  Performed at [facility], [address], [place], [place] [address]      Labs: BNP (last 3 results) No results for input(s): BNP in the last 8760 hours. Basic Metabolic Panel: Recent Labs  Lab 09/21/20 1753 09/22/20 0430  NA 138 139  K 3.5 3.4*  CL 99 103  CO2 26 28  GLUCOSE 133* 116*  BUN 18 13  CREATININE 0.85 0.87  CALCIUM 10.0 9.4  MG  --  2.3  PHOS  --  3.0   Liver Function Tests: Recent Labs  Lab 09/21/20 1753 09/22/20 0430  AST 45* 84*  ALT 24 35  ALKPHOS 47 43  BILITOT 1.4* 1.2  PROT 8.4* 7.5  ALBUMIN 4.7 4.1   Recent  Labs  Lab 09/21/20 1753 09/22/20 0430  LIPASE 30 39   No results for input(s): AMMONIA in the last 168 hours. CBC: Recent Labs  Lab 09/21/20 1753 09/22/20 0040 09/22/20 0430 09/23/20 0602  WBC 11.0* 11.2* 10.8*  --   NEUTROABS 8.7*  --   --   --   HGB 15.6 15.1 14.9 15.4  HCT 46.6 45.2 44.2 45.7  MCV 84.3 84.8 85.2  --   PLT 370 340 321  --    Cardiac Enzymes: No results for input(s): CKTOTAL, CKMB, CKMBINDEX, TROPONINI in the last 168 hours. BNP: Invalid input(s): POCBNP CBG: Recent Labs  Lab 09/22/20 0056 09/22/20 1655 09/22/20 2102 09/23/20 0738  GLUCAP 121* 131* 175* 138*   D-Dimer No results for  input(s): DDIMER in the last 72 hours. Hgb A1c No results for input(s): HGBA1C in the last 72 hours. Lipid Profile No results for input(s): CHOL, HDL, LDLCALC, TRIG, CHOLHDL, LDLDIRECT in the last 72 hours. Thyroid function studies No results for input(s): TSH, T4TOTAL, T3FREE, THYROIDAB in the last 72 hours.  Invalid input(s): FREET3 Anemia work up No results for input(s): VITAMINB12, FOLATE, FERRITIN, TIBC, IRON, RETICCTPCT in the last 72 hours. Urinalysis    Component Value Date/Time   COLORURINE YELLOW 09/21/2020 1753   APPEARANCEUR CLEAR 09/21/2020 1753   LABSPEC 1.013 09/21/2020 1753   PHURINE 7.0 09/21/2020 1753   GLUCOSEU NEGATIVE 09/21/2020 1753   HGBUR SMALL (A) 09/21/2020 1753   BILIRUBINUR NEGATIVE 09/21/2020 1753   BILIRUBINUR negative 07/28/2015 1438   BILIRUBINUR Small 06/15/2014 1841   KETONESUR 5 (A) 09/21/2020 1753   PROTEINUR NEGATIVE 09/21/2020 1753   UROBILINOGEN 0.2 07/28/2015 1438   UROBILINOGEN 0.2 09/23/2014 1009   NITRITE NEGATIVE 09/21/2020 1753   LEUKOCYTESUR NEGATIVE 09/21/2020 1753   Sepsis Labs Invalid input(s): PROCALCITONIN,  WBC,  LACTICIDVEN Microbiology Recent Results (from the past 240 hour(s))  Resp Panel by RT-PCR (Flu A&B, Covid) Nasopharyngeal Swab     Status: None   Collection Time: 09/21/20  7:45 PM   Specimen: Nasopharyngeal Swab; Nasopharyngeal(NP) swabs in vial transport medium  Result Value Ref Range Status   SARS Coronavirus 2 by RT PCR NEGATIVE NEGATIVE Final    Comment: (NOTE) SARS-CoV-2 target nucleic acids are NOT DETECTED.  The SARS-CoV-2 RNA is generally detectable in upper respiratory specimens during the acute phase of infection. The lowest concentration of SARS-CoV-2 viral copies this assay can detect is 138 copies/mL. A negative result does not preclude SARS-Cov-2 infection and should not be used as the sole basis for treatment or other patient management decisions. A negative result may occur with  improper  specimen collection/handling, submission of specimen other than nasopharyngeal swab, presence of viral mutation(s) within the areas targeted by this assay, and inadequate number of viral copies(<138 copies/mL). A negative result must be combined with clinical observations, patient history, and epidemiological information. The expected result is Negative.  Fact Sheet for Patients:  BloggerCourse.com  Fact Sheet for Healthcare Providers:  SeriousBroker.it  This test is no t yet approved or cleared by the [place] FDA and  has been authorized for detection and/or diagnosis of SARS-CoV-2 by FDA under an Emergency Use Authorization (EUA). This EUA will remain  in effect (meaning this test can be used) for the duration of the COVID-19 declaration under Section 564(b)(1) of the Act, 21 U.S.C.section 360bbb-3(b)(1), unless the authorization is terminated  or revoked sooner.       Influenza A by PCR NEGATIVE NEGATIVE Final   Influenza  B by PCR NEGATIVE NEGATIVE Final    Comment: (NOTE) The Xpert Xpress SARS-CoV-2/FLU/RSV plus assay is intended as an aid in the diagnosis of influenza from Nasopharyngeal swab specimens and should not be used as a sole basis for treatment. Nasal washings and aspirates are unacceptable for Xpert Xpress SARS-CoV-2/FLU/RSV testing.  Fact Sheet for Patients: BloggerCourse.comhttps://www.fda.gov/media/152166/download  Fact Sheet for Healthcare Providers: SeriousBroker.ithttps://www.fda.gov/media/152162/download  This test is not yet approved or cleared by the [place] FDA and has been authorized for detection and/or diagnosis of SARS-CoV-2 by FDA under an Emergency Use Authorization (EUA). This EUA will remain in effect (meaning this test can be used) for the duration of the COVID-19 declaration under Section 564(b)(1) of the Act, 21 U.S.C. section 360bbb-3(b)(1), unless the authorization is terminated or revoked.  Performed at  [facility], [address] [place], [place] [id number]      Time coordinating discharge: 45 minutes  SIGNED:   Coralie KeensMauricio Daniel Nehan Flaum, MD  [facility] 09/23/2020, 8:37 AM

## 2020-09-24 ENCOUNTER — Telehealth: Payer: Self-pay

## 2020-09-24 NOTE — Telephone Encounter (Signed)
Transition Care Management Follow-up Telephone Call   Date of discharge and from where: [facility] on 09/24/2019 How have you been since you were released from the hospital? Doing good Any questions or concerns? No questions/concerns reported.  Items Reviewed: Did the pt receive and understand the discharge instructions provided? have the instructions and have no questions.  Medications obtained and verified? He said that he have the medication list  and the hospital staff reviewed them in detail prior to discharge. No questions regarding medications Any new allergies since your discharge? None reported  Do you have support at home? Yes, wife Other (ie: DME, Home Health, etc)        Functional Questionnaire: (I = Independent and D = Dependent) ADL's:  Independent.        Follow up appointments reviewed:   PCP Hospital f/u appt confirmed?NP Leim Fabry on 10/06/2020 @ 1050.  Specialist Hospital f/u appt confirmed? None scheduled at this time  Are transportation arrangements needed? have transportation   If their condition worsens, is the pt aware to call  their PCP or go to the ED? Yes.Made pt aware if condition worsen or start experiencing rapid weight gain, chest pain, diff breathing, SOB, high fevers, or bleading to refer imediately to ED for further evaluation.  Was the patient provided with contact information for the PCP's office or ED? He has the phone number  Was the pt encouraged to call back with questions or concerns?yes

## 2020-10-06 ENCOUNTER — Ambulatory Visit (INDEPENDENT_AMBULATORY_CARE_PROVIDER_SITE_OTHER): Payer: Self-pay | Admitting: Primary Care

## 2020-11-01 ENCOUNTER — Ambulatory Visit (INDEPENDENT_AMBULATORY_CARE_PROVIDER_SITE_OTHER): Payer: Self-pay | Admitting: Primary Care

## 2020-11-17 ENCOUNTER — Encounter (INDEPENDENT_AMBULATORY_CARE_PROVIDER_SITE_OTHER): Payer: Self-pay | Admitting: Primary Care

## 2020-11-17 ENCOUNTER — Other Ambulatory Visit: Payer: Self-pay

## 2020-11-17 ENCOUNTER — Ambulatory Visit (INDEPENDENT_AMBULATORY_CARE_PROVIDER_SITE_OTHER): Payer: Self-pay | Admitting: Primary Care

## 2020-11-17 VITALS — BP 120/89 | HR 104 | Temp 97.7°F | Ht 66.0 in | Wt 153.0 lb

## 2020-11-17 DIAGNOSIS — K2920 Alcoholic gastritis without bleeding: Secondary | ICD-10-CM

## 2020-11-17 DIAGNOSIS — I10 Essential (primary) hypertension: Secondary | ICD-10-CM

## 2020-11-17 DIAGNOSIS — Z09 Encounter for follow-up examination after completed treatment for conditions other than malignant neoplasm: Secondary | ICD-10-CM

## 2020-11-17 DIAGNOSIS — E119 Type 2 diabetes mellitus without complications: Secondary | ICD-10-CM

## 2020-11-17 LAB — POCT GLYCOSYLATED HEMOGLOBIN (HGB A1C): Hemoglobin A1C: 6.7 % — AB (ref 4.0–5.6)

## 2020-11-17 MED ORDER — INSULIN GLARGINE 100 UNIT/ML SOLOSTAR PEN: 15.0000 [IU] | PEN_INJECTOR | Freq: Two times a day (BID) | SUBCUTANEOUS | 3 refills | Status: DC

## 2020-11-17 MED ORDER — AMLODIPINE BESYLATE 10 MG PO TABS: 10.0000 mg | ORAL_TABLET | Freq: Every day | ORAL | 3 refills | Status: DC

## 2020-11-17 MED ORDER — ONDANSETRON 4 MG PO TBDP: 4.0000 mg | ORAL_TABLET | Freq: Three times a day (TID) | ORAL | 0 refills | Status: DC | PRN

## 2020-11-17 NOTE — Patient Instructions (Signed)
Gastroparesis  Gastroparesis is a condition in which food takes longer than normal to empty from the stomach. This condition is also known as delayed gastric emptying. It is usually a long-term (chronic) condition. There is no cure, but there are treatments and things that you can do at home to help relieve symptoms. Treating the underlying condition that causesgastroparesis can also help relieve symptoms. What are the causes? In many cases, the cause of this condition is not known. Possible causes include: A hormone (endocrine) disorder, such as hypothyroidism or diabetes. A nervous system disease, such as Parkinson's disease or multiple sclerosis. Cancer, infection, or surgery that affects the stomach or vagus nerve. The vagus nerve runs from your chest, through your neck, and to the lower part of your brain. A connective tissue disorder, such as scleroderma. Certain medicines. What increases the risk? You are more likely to develop this condition if: You have certain disorders or diseases. These may include: An endocrine disorder. An eating disorder. Amyloidosis. Scleroderma. Parkinson's disease. Multiple sclerosis. Cancer or infection of the stomach or the vagus nerve. You have had surgery on your stomach or vagus nerve. You take certain medicines. You are male. What are the signs or symptoms? Symptoms of this condition include: Feeling full after eating very little or a loss of appetite. Nausea, vomiting, or heartburn. Bloating of your abdomen. Inconsistent blood sugar (glucose) levels on blood tests. Unexplained weight loss. Acid from the stomach coming up into the esophagus (gastroesophageal reflux). Sudden tightening (spasm) of the stomach, which can be painful. Symptoms may come and go. Some people may not notice any symptoms. How is this diagnosed? This condition is diagnosed with tests, such as: Tests that check how long it takes food to move through the stomach and  intestines. These tests include: Upper gastrointestinal (GI) series. For this test, you drink a liquid that shows up well on X-rays, and then X-rays are taken of your intestines. Gastric emptying scintigraphy. For this test, you eat food that contains a small amount of radioactive material, and then scans are taken. Wireless capsule GI monitoring system. For this test, you swallow a pill (capsule) that records information about how foods and fluid move through your stomach. Gastric manometry. For this test, a tube is passed down your throat and into your stomach to measure electrical and muscular activity. Endoscopy. For this test, a long, thin tube with a camera and light on the end is passed down your throat and into your stomach to check for problems in your stomach lining. Ultrasound. This test uses sound waves to create images of the inside of your body. This can help rule out gallbladder disease or pancreatitis as a cause of your symptoms. How is this treated? There is no cure for this condition, but treatment and home care may relieve symptoms. Treatment may include: Treating the underlying cause. Managing your symptoms by making changes to your diet and exercise habits. Taking medicines to control nausea and vomiting and to stimulate stomach muscles. Getting food through a feeding tube in the hospital. This may be done in severe cases. Having surgery to insert a device called a gastric electrical stimulator into your body. This device helps improve stomach emptying and control nausea and vomiting. Follow these instructions at home: Take over-the-counter and prescription medicines only as told by your health care provider. Follow instructions from your health care provider about eating or drinking restrictions. Your health care provider may recommend that you: Eat smaller meals more often. Eat low-fat   foods. Eat low-fiber forms of high-fiber foods. For example, eat cooked vegetables instead  of raw vegetables. Have only liquid foods instead of solid foods. Liquid foods are easier to digest. Drink enough fluid to keep your urine pale yellow. Exercise as often as told by your health care provider. Keep all follow-up visits. This is important. Contact a health care provider if you: Notice that your symptoms do not improve with treatment. Have new symptoms. Get help right away if you: Have severe pain in your abdomen that does not improve with treatment. Have nausea that is severe or does not go away. Vomit every time you drink fluids. Summary Gastroparesis is a long-term (chronic) condition in which food takes longer than normal to empty from the stomach. Symptoms include nausea, vomiting, heartburn, bloating of your abdomen, and loss of appetite. Eating smaller portions, low-fat foods, and low-fiber forms of high-fiber foods may help you manage your symptoms. Get help right away if you have severe pain in your abdomen. This information is not intended to replace advice given to you by your health care provider. Make sure you discuss any questions you have with your healthcare provider. Document Revised: 06/30/2019 Document Reviewed: 06/30/2019 Elsevier Patient Education  2022 [employer]  

## 2020-11-17 NOTE — Progress Notes (Signed)
[facility]   Subjective:   David [name] is a 40 y.o. male presents for hospital follow up and establish care. Admit date to the hospital was 09/21/20, patient was discharged from the hospital on 09/23/20, patient was admitted for:  Presented to the ED with complaints of abdominal pain nausea and vomiting. The day before he had episodes of vomiting produced coffee-ground emesis.  He does have a hx of alcohol abuse. Today he denies any abdominal pain and vomiting. He remains very nauseated. No  shortness of breath, headaches, chest pain or lower extremity edema  Past Medical History:  Diagnosis Date   Diabetes mellitus (HCC)    Hypertension    Metacarpal bone fracture 07/11/2015   right small   Neuropathy    Seizure with provoking factor (HCC)    states seizure was due to alcohol      No Known Allergies    Current Outpatient Medications on File Prior to Visit  Medication Sig Dispense Refill   Insulin Pen Needle (PEN NEEDLES 31GX5/16") 31G X 8 MM MISC As directed 100 each 1   omeprazole (PRILOSEC) 20 MG capsule Take by mouth 2 (two) times daily.     No current facility-administered medications on file prior to visit.   Review of System: Review of Systems  All other systems reviewed and are negative.  Objective:  BP 120/89 (BP Location: Right Arm, Patient Position: Sitting, Cuff Size: Normal)   Pulse (!) 104   Temp 97.7 F (36.5 C) (Temporal)   Ht 5\' 6"  (1.676 m)   Wt 153 lb (69.4 kg)   SpO2 97%   BMI 24.69 kg/m   Filed Weights   11/17/20 1613  Weight: 153 lb (69.4 kg)    Physical Exam: General Appearance: Well nourished, in no apparent distress. Eyes: PERRLA, EOMs, conjunctiva no swelling or erythema Sinuses: No Frontal/maxillary tenderness ENT/Mouth: Ext aud canals clear, TMs without erythema, bulging. Hearing normal.  Neck: Supple, thyroid normal.  Respiratory: Respiratory effort normal, BS equal bilaterally without rales, rhonchi, wheezing or  stridor.  Cardio: RRR with no MRGs. Brisk peripheral pulses without edema.  Abdomen: Soft, + BS.  tender, no guarding, rebound, hernias, masses. Lymphatics: Non tender without lymphadenopathy.  Musculoskeletal: Full ROM, 5/5 strength, normal gait.  Skin: Warm, dry without rashes, lesions, ecchymosis.  Neuro: Cranial nerves intact. Normal muscle tone, no cerebellar symptoms. Sensation intact.  Psych: Awake and oriented X 3, normal affect, Insight and Judgment appropriate.    Assessment:  Susana was seen today for hypertension and diabetes.  Diagnoses and all orders for this visit:  Type 2 diabetes mellitus without complication, without long-term current use of insulin (HCC) -     HgB A1c 6.7 Well controlled. Continue to monitor foods that are high in carbohydrates are the following rice, potatoes, breads, sugars, and pastas.  Reduction in the intake (eating) will assist in lowering your blood sugars.  -     insulin glargine (LANTUS) 100 UNIT/ML Solostar Pen; Inject 15 Units into the skin 2 (two) times daily.  Primary hypertension Blood pressure is close to  goal of less than 130/80, low-sodium, DASH diet, medication compliance, 150 minutes of moderate intensity exercise per week. Discussed medication compliance, adverse effects.  -     amLODipine (NORVASC) 10 MG tablet; Take 1 tablet (10 mg total) by mouth daily.  Hospital discharge follow-up Retrieved from discharge note Recommendations for Outpatient Follow-up and new medication changes:  Follow up with 11/19/20 P NP in 7  to 10 days. -completed  Started on omeprazole 40 mg po bid Avoid alcohol.    Chronic alcoholic gastritis without hemorrhage - I have recommended  cessation of alcohol use. I have discussed various options available for assistance with alcohol cessation AAA, rehab - inpatient . -     ondansetron (ZOFRAN ODT) 4 MG disintegrating tablet; Take 1 tablet (4 mg total) by mouth every 8 (eight) hours as needed for  nausea or vomiting.   Other orders -     ondansetron (ZOFRAN ODT) 4 MG disintegrating tablet; Take 1 tablet (4 mg total) by mouth every 8 (eight) hours as needed for nausea or vomiting.   Meds ordered this encounter  Medications   amLODipine (NORVASC) 10 MG tablet    Sig: Take 1 tablet (10 mg total) by mouth daily.    Dispense:  90 tablet    Refill:  3   ondansetron (ZOFRAN ODT) 4 MG disintegrating tablet    Sig: Take 1 tablet (4 mg total) by mouth every 8 (eight) hours as needed for nausea or vomiting.    Dispense:  20 tablet    Refill:  0   insulin glargine (LANTUS) 100 UNIT/ML Solostar Pen    Sig: Inject 15 Units into the skin 2 (two) times daily.    Dispense:  15 mL    Refill:  3    This note has been created with Education officer, environmental. Any transcriptional errors are unintentional.   Grayce Sessions, NP 11/21/2020, 10:15 PM

## 2020-11-17 NOTE — Progress Notes (Signed)
Would like a Rx for nausea

## 2020-12-02 ENCOUNTER — Other Ambulatory Visit (INDEPENDENT_AMBULATORY_CARE_PROVIDER_SITE_OTHER): Payer: Self-pay | Admitting: Primary Care

## 2020-12-02 ENCOUNTER — Ambulatory Visit (INDEPENDENT_AMBULATORY_CARE_PROVIDER_SITE_OTHER): Payer: Self-pay

## 2020-12-02 ENCOUNTER — Encounter (INDEPENDENT_AMBULATORY_CARE_PROVIDER_SITE_OTHER): Payer: Self-pay | Admitting: Primary Care

## 2020-12-02 DIAGNOSIS — K2920 Alcoholic gastritis without bleeding: Secondary | ICD-10-CM

## 2020-12-02 NOTE — Telephone Encounter (Signed)
Error

## 2020-12-02 NOTE — Telephone Encounter (Signed)
Request has already been sent to PCP.  °

## 2020-12-02 NOTE — Telephone Encounter (Signed)
Caller states patient is out of ondansetron (ZOFRAN ODT) 4 MG and was advised by the pharmacy to call PCP and ask for a request. Caller states medication is working but since he has been without he started throwing up, patient has thrown up 4x in the past few days.       Pt.'s mother reports Zofran has helped the nausea and vomiting. Pt. Is out of medication and is requesting a refill. Please advise.    Answer Assessment - Initial Assessment Questions 1. VOMITING SEVERITY: "How many times have you vomited in the past 24 hours?"     - MILD:  1 - 2 times/day    - MODERATE: 3 - 5 times/day, decreased oral intake without significant weight loss or symptoms of dehydration    - SEVERE: 6 or more times/day, vomits everything or nearly everything, with significant weight loss, symptoms of dehydration      3 x yesterday 2. ONSET: "When did the vomiting begin?"      This month 3. FLUIDS: "What fluids or food have you vomited up today?" "Have you been able to keep any fluids down?"     Yes 4. ABDOMINAL PAIN: "Are your having any abdominal pain?" If yes : "How bad is it and what does it feel like?" (e.g., crampy, dull, intermittent, constant)      Mild 5. DIARRHEA: "Is there any diarrhea?" If Yes, ask: "How many times today?"      No 6. CONTACTS: "Is there anyone else in the family with the same symptoms?"      No 7. CAUSE: "What do you think is causing your vomiting?"     Gastritis 8. HYDRATION STATUS: "Any signs of dehydration?" (e.g., dry mouth [not only dry lips], too weak to stand) "When did you last urinate?"     No 9. OTHER SYMPTOMS: "Do you have any other symptoms?" (e.g., fever, headache, vertigo, vomiting blood or coffee grounds, recent head injury)     No 10. PREGNANCY: "Is there any chance you are pregnant?" "When was your last menstrual period?"       N/a  Protocols used: Vomiting-A-AH

## 2020-12-02 NOTE — Telephone Encounter (Signed)
Sent to PCP ?

## 2020-12-06 ENCOUNTER — Telehealth (INDEPENDENT_AMBULATORY_CARE_PROVIDER_SITE_OTHER): Payer: Self-pay
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
## 2020-12-07 ENCOUNTER — Other Ambulatory Visit (INDEPENDENT_AMBULATORY_CARE_PROVIDER_SITE_OTHER): Payer: Self-pay | Admitting: Primary Care

## 2020-12-07 DIAGNOSIS — K2920 Alcoholic gastritis without bleeding: Secondary | ICD-10-CM

## 2020-12-07 MED ORDER — ONDANSETRON 4 MG PO TBDP: ORAL_TABLET | ORAL | 0 refills | Status: DC

## 2021-01-04 ENCOUNTER — Emergency Department (HOSPITAL_COMMUNITY)
Admission: EM | Admit: 2021-01-04 | Discharge: 2021-01-04 | Disposition: A | Discharge status: Home / Self Care | Payer: Self-pay | Attending: Emergency Medicine | Admitting: Emergency Medicine

## 2021-01-04 ENCOUNTER — Telehealth (INDEPENDENT_AMBULATORY_CARE_PROVIDER_SITE_OTHER): Payer: Self-pay

## 2021-01-04 ENCOUNTER — Other Ambulatory Visit: Payer: Self-pay

## 2021-01-04 DIAGNOSIS — Z5321 Procedure and treatment not carried out due to patient leaving prior to being seen by health care provider: Secondary | ICD-10-CM | POA: Insufficient documentation

## 2021-01-04 DIAGNOSIS — R1110 Vomiting, unspecified: Secondary | ICD-10-CM | POA: Insufficient documentation

## 2021-01-04 LAB — ETHANOL: Alcohol, Ethyl (B): <10 mg/dL (ref <10)

## 2021-01-04 LAB — COMPREHENSIVE METABOLIC PANEL
ALT: 10 U/L (ref 0–44)
AST: 15 U/L (ref 15–41)
Albumin: 3.9 g/dL (ref 3.5–5.0)
Alkaline Phosphatase: 42 U/L (ref 38–126)
Anion gap: 8 (ref 5–15)
BUN: 9 mg/dL (ref 6–20)
CO2: 25 mmol/L (ref 22–32)
Calcium: 9.3 mg/dL (ref 8.9–10.3)
Chloride: 104 mmol/L (ref 98–111)
Creatinine, Ser: 0.81 mg/dL (ref 0.61–1.24)
GFR, Estimated: >60 mL/min (ref >60)
Glucose, Bld: 204 mg/dL — ABNORMAL HIGH (ref 70–99)
Potassium: 3.4 mmol/L — ABNORMAL LOW (ref 3.5–5.1)
Sodium: 137 mmol/L (ref 135–145)
Total Bilirubin: 0.8 mg/dL (ref 0.3–1.2)
Total Protein: 7 g/dL (ref 6.5–8.1)

## 2021-01-04 LAB — CBC
HCT: 44.2 % (ref 39.0–52.0)
Hemoglobin: 14.8 g/dL (ref 13.0–17.0)
MCH: 28.2 pg (ref 26.0–34.0)
MCHC: 33.5 g/dL (ref 30.0–36.0)
MCV: 84.2 fL (ref 80.0–100.0)
Platelets: 340 10*3/uL (ref 150–400)
RBC: 5.25 MIL/uL (ref 4.22–5.81)
RDW: 12.7 % (ref 11.5–15.5)
WBC: 9.4 10*3/uL (ref 4.0–10.5)
nRBC: 0 % (ref 0.0–0.2)

## 2021-01-04 LAB — LIPASE, BLOOD: Lipase: 40 U/L (ref 11–51)

## 2021-01-04 MED ORDER — ONDANSETRON 4 MG PO TBDP
4.0000 mg | ORAL_TABLET | Freq: Once | ORAL | Status: AC | PRN
  Administered 2021-01-04: 4 mg via ORAL
  Filled 2021-01-04: qty 1

## 2021-01-04 NOTE — Telephone Encounter (Signed)
Call patient no answer or voice mail. Major concern is he continuing to drink alcohol.

## 2021-01-04 NOTE — ED Triage Notes (Signed)
Pt reports increase saliva production and "watery mouth" for a few days. Pt spitting up clear phlegm in triage, but reports sometimes it's yellow. Denies abdominal pain.

## 2021-01-04 NOTE — ED Provider Notes (Signed)
Emergency Medicine Provider Triage Evaluation Note  David [name] , a 40 y.o. male  was evaluated in triage.  Pt complains of spitting up for 2 days. He has been spitting up clear-yellow phlegm without blood noted. Pt notes that he has a history of similar symptoms. Has associated nausea and increased saliva. Didn't try any medications for his symptoms. Denies abdominal pain, fever, chills, cough, chest pain, or shortness of breath.  Review of Systems  Positive: Nausea Negative: Abdominal pain  Physical Exam  BP (!) 169/117 (BP Location: Right Arm)   Pulse (!) 113   Temp 98.3 F (36.8 C)   Resp 16   SpO2 100%  Gen:   Awake, no distress   Resp:  Normal effort  MSK:   Moves extremities without difficulty  Other:  RRR.   Repeat vital signs in room, blood pressure 162/100 (left arm), heart rate at 92, O2 at 100%.   Medical Decision Making  Medically screening exam initiated at 5:10 PM.  Appropriate orders placed.  David [name] was informed that the remainder of the evaluation will be completed by another provider, this initial triage assessment does not replace that evaluation, and the importance of remaining in the ED until their evaluation is complete.    [address] David [name] A, [place] 01/04/21 1729    David Loll, MD 01/07/21 [phone]

## 2021-01-04 NOTE — Telephone Encounter (Signed)
Copied from CRM [id number]. Topic: General - Other >> Jan 04, 2021  1:54 PM Jaquita Rector A wrote: Reason for CRM: Patient mom called in to say that he is still vomiting and need the medication requested since he still have a little while before his upcoming visit. Please advise

## 2021-01-04 NOTE — ED Notes (Signed)
Pt called several times for vitals no answer. 

## 2021-01-13 ENCOUNTER — Encounter (INDEPENDENT_AMBULATORY_CARE_PROVIDER_SITE_OTHER): Payer: Self-pay | Admitting: Primary Care

## 2021-01-13 ENCOUNTER — Ambulatory Visit (INDEPENDENT_AMBULATORY_CARE_PROVIDER_SITE_OTHER): Payer: Self-pay | Admitting: Primary Care

## 2021-01-13 ENCOUNTER — Other Ambulatory Visit: Payer: Self-pay

## 2021-01-13 VITALS — BP 141/93 | HR 95 | Temp 97.5°F | Ht 66.0 in | Wt 156.8 lb

## 2021-01-13 DIAGNOSIS — E119 Type 2 diabetes mellitus without complications: Secondary | ICD-10-CM

## 2021-01-13 DIAGNOSIS — I10 Essential (primary) hypertension: Secondary | ICD-10-CM

## 2021-01-13 MED ORDER — CLONIDINE 0.2 MG/24HR TD PTWK: 0.2000 mg | MEDICATED_PATCH | TRANSDERMAL | 12 refills | Status: DC

## 2021-01-13 MED ORDER — INSULIN ASPART 100 UNIT/ML IJ SOLN: INTRAMUSCULAR | 99 refills | Status: DC

## 2021-01-13 NOTE — Patient Instructions (Signed)
Gastroparesis is also called delayed gastric emptying it consist of weak muscular contractions (peristalsis) of the stomach resulting in food and liquid remaining in the stomach for prolonged periods of time.  Stomach contents does exist more slowly into the duodenum digestive tract.  Symptoms can include nausea vomiting abdominal pain feeling full soon after or beginning to eat, abdominal bloating and heartburn.  Discussed eating small frequent meal, reduction in acidic foods, fried foods ,spicy foods, alcohol caffeine and tobacco and certain medications. Avoid laying down after eating 65mins-1hour, elevated head of the bed.

## 2021-02-01 NOTE — Progress Notes (Signed)
  [facility]    HPI Mr. David [name] 40 y.o.male presents for having nausea and requesting medication refills.   Past Medical History:  Diagnosis Date   Diabetes mellitus (HCC)    Hypertension    Metacarpal bone fracture 07/11/2015   right small   Neuropathy    Seizure with provoking factor (HCC)    states seizure was due to alcohol      No Known Allergies    Current Outpatient Medications on File Prior to Visit  Medication Sig Dispense Refill   ondansetron (ZOFRAN-ODT) 4 MG disintegrating tablet DISSOLVE 1 TABLET(4 MG) ON THE TONGUE EVERY 8 HOURS AS NEEDED FOR NAUSEA OR VOMITING 20 tablet 0   Insulin Pen Needle (PEN NEEDLES 31GX5/16") 31G X 8 MM MISC As directed 100 each 1   omeprazole (PRILOSEC) 20 MG capsule Take by mouth 2 (two) times daily. (Patient not taking: Reported on 01/13/2021)     No current facility-administered medications on file prior to visit.    ROS: all negative except above.   Physical Exam: Filed Weights   01/13/21 1114  Weight: 156 lb 12.8 oz (71.1 kg)   BP (!) 141/93 (BP Location: Right Arm, Patient Position: Sitting, Cuff Size: Normal)   Pulse 95   Temp (!) 97.5 F (36.4 C) (Temporal)   Ht 5\' 6"  (1.676 m)   Wt 156 lb 12.8 oz (71.1 kg)   SpO2 95%   BMI 25.31 kg/m  General Appearance: Well nourished, in no apparent distress. Eyes: PERRLA, EOMs, conjunctiva no swelling or erythema Sinuses: No Frontal/maxillary tenderness ENT/Mouth: Ext aud canals clear, TMs without erythema, bulging. Hearing normal.  Neck: Supple, thyroid normal.  Respiratory: Respiratory effort normal, BS equal bilaterally without rales, rhonchi, wheezing or stridor.  Cardio: RRR with no MRGs. Brisk peripheral pulses without edema.  Abdomen: Soft, + BS.  Non tender, no guarding, rebound, hernias, masses. Lymphatics: Non tender without lymphadenopathy.  Musculoskeletal: Full ROM, 5/5 strength, normal gait.  Skin: Warm, dry without rashes, lesions,  ecchymosis.  Neuro: Cranial nerves intact. Normal muscle tone, no cerebellar symptoms. Sensation intact.  Psych: Awake and oriented X 3, normal affect, Insight and Judgment appropriate.   Harbor was seen today for medication refill, ptyalism and nausea.  Diagnoses and all orders for this visit:  Essential hypertension Counseled on blood pressure goal of less than 130/80, low-sodium, DASH diet, medication compliance, 150 minutes of moderate intensity exercise per week. Discussed medication compliance, adverse effects.  -     cloNIDine (CATAPRES - DOSED IN MG/24 HR) 0.2 mg/24hr patch; Place 1 patch (0.2 mg total) onto the skin once a week.  Type 2 diabetes mellitus without complication, without long-term current use of insulin (HCC) -     insulin aspart (NOVOLOG) 100 UNIT/ML injection; For blood sugars 0-150 give 0 units of insulin, 200-250 give 4 units of insulin, 251-300 give 6 units, 301-350 give 8 units, 351- 400 give 10 units, > 400 give 12 units and call M.D. Discussed hypoglycemia protocol.    , NP 2:41 PM

## 2021-02-24 ENCOUNTER — Ambulatory Visit (INDEPENDENT_AMBULATORY_CARE_PROVIDER_SITE_OTHER): Payer: Self-pay | Admitting: Primary Care

## 2021-05-13 ENCOUNTER — Emergency Department (HOSPITAL_COMMUNITY): Payer: Medicaid Other

## 2021-05-13 ENCOUNTER — Other Ambulatory Visit: Payer: Self-pay

## 2021-05-13 ENCOUNTER — Encounter (HOSPITAL_COMMUNITY): Payer: Self-pay

## 2021-05-13 ENCOUNTER — Inpatient Hospital Stay (HOSPITAL_COMMUNITY)
Admission: EM | Admit: 2021-05-13 | Discharge: 2021-05-17 | DRG: 286 | Disposition: A | Discharge status: Home / Self Care | Payer: Medicaid Other | Attending: Internal Medicine | Admitting: Internal Medicine

## 2021-05-13 DIAGNOSIS — E876 Hypokalemia: Secondary | ICD-10-CM | POA: Diagnosis present

## 2021-05-13 DIAGNOSIS — Z79899 Other long term (current) drug therapy: Secondary | ICD-10-CM

## 2021-05-13 DIAGNOSIS — N179 Acute kidney failure, unspecified: Secondary | ICD-10-CM | POA: Diagnosis present

## 2021-05-13 DIAGNOSIS — D72828 Other elevated white blood cell count: Secondary | ICD-10-CM | POA: Diagnosis present

## 2021-05-13 DIAGNOSIS — F1910 Other psychoactive substance abuse, uncomplicated: Secondary | ICD-10-CM | POA: Diagnosis present

## 2021-05-13 DIAGNOSIS — I951 Orthostatic hypotension: Secondary | ICD-10-CM | POA: Diagnosis present

## 2021-05-13 DIAGNOSIS — I255 Ischemic cardiomyopathy: Secondary | ICD-10-CM | POA: Diagnosis present

## 2021-05-13 DIAGNOSIS — Z794 Long term (current) use of insulin: Secondary | ICD-10-CM

## 2021-05-13 DIAGNOSIS — I248 Other forms of acute ischemic heart disease: Secondary | ICD-10-CM | POA: Diagnosis present

## 2021-05-13 DIAGNOSIS — E119 Type 2 diabetes mellitus without complications: Secondary | ICD-10-CM

## 2021-05-13 DIAGNOSIS — E8809 Other disorders of plasma-protein metabolism, not elsewhere classified: Secondary | ICD-10-CM | POA: Diagnosis present

## 2021-05-13 DIAGNOSIS — I110 Hypertensive heart disease with heart failure: Secondary | ICD-10-CM | POA: Diagnosis present

## 2021-05-13 DIAGNOSIS — I152 Hypertension secondary to endocrine disorders: Secondary | ICD-10-CM | POA: Diagnosis present

## 2021-05-13 DIAGNOSIS — D751 Secondary polycythemia: Secondary | ICD-10-CM | POA: Diagnosis present

## 2021-05-13 DIAGNOSIS — I10 Essential (primary) hypertension: Secondary | ICD-10-CM

## 2021-05-13 DIAGNOSIS — F1010 Alcohol abuse, uncomplicated: Secondary | ICD-10-CM | POA: Diagnosis present

## 2021-05-13 DIAGNOSIS — Z653 Problems related to other legal circumstances: Secondary | ICD-10-CM

## 2021-05-13 DIAGNOSIS — E860 Dehydration: Secondary | ICD-10-CM | POA: Diagnosis present

## 2021-05-13 DIAGNOSIS — R1115 Cyclical vomiting syndrome unrelated to migraine: Secondary | ICD-10-CM | POA: Diagnosis present

## 2021-05-13 DIAGNOSIS — E1159 Type 2 diabetes mellitus with other circulatory complications: Secondary | ICD-10-CM | POA: Diagnosis present

## 2021-05-13 DIAGNOSIS — I5041 Acute combined systolic (congestive) and diastolic (congestive) heart failure: Secondary | ICD-10-CM | POA: Diagnosis present

## 2021-05-13 DIAGNOSIS — R778 Other specified abnormalities of plasma proteins: Secondary | ICD-10-CM

## 2021-05-13 DIAGNOSIS — I513 Intracardiac thrombosis, not elsewhere classified: Principal | ICD-10-CM | POA: Diagnosis present

## 2021-05-13 DIAGNOSIS — Z8719 Personal history of other diseases of the digestive system: Secondary | ICD-10-CM

## 2021-05-13 DIAGNOSIS — R9431 Abnormal electrocardiogram [ECG] [EKG]: Secondary | ICD-10-CM | POA: Diagnosis present

## 2021-05-13 DIAGNOSIS — E1165 Type 2 diabetes mellitus with hyperglycemia: Secondary | ICD-10-CM | POA: Diagnosis present

## 2021-05-13 DIAGNOSIS — F1410 Cocaine abuse, uncomplicated: Secondary | ICD-10-CM | POA: Diagnosis present

## 2021-05-13 DIAGNOSIS — I420 Dilated cardiomyopathy: Secondary | ICD-10-CM | POA: Diagnosis present

## 2021-05-13 DIAGNOSIS — I5021 Acute systolic (congestive) heart failure: Secondary | ICD-10-CM

## 2021-05-13 DIAGNOSIS — Z20822 Contact with and (suspected) exposure to covid-19: Secondary | ICD-10-CM | POA: Diagnosis present

## 2021-05-13 DIAGNOSIS — Z597 Insufficient social insurance and welfare support: Secondary | ICD-10-CM

## 2021-05-13 DIAGNOSIS — Z833 Family history of diabetes mellitus: Secondary | ICD-10-CM

## 2021-05-13 DIAGNOSIS — F17210 Nicotine dependence, cigarettes, uncomplicated: Secondary | ICD-10-CM | POA: Diagnosis present

## 2021-05-13 LAB — BASIC METABOLIC PANEL
Anion gap: 7 (ref 5–15)
BUN: 29 mg/dL — ABNORMAL HIGH (ref 6–20)
CO2: 21 mmol/L — ABNORMAL LOW (ref 22–32)
Calcium: 6.5 mg/dL — ABNORMAL LOW (ref 8.9–10.3)
Chloride: 109 mmol/L (ref 98–111)
Creatinine, Ser: 1.62 mg/dL — ABNORMAL HIGH (ref 0.61–1.24)
GFR, Estimated: 55 mL/min — ABNORMAL LOW (ref >60)
Glucose, Bld: 170 mg/dL — ABNORMAL HIGH (ref 70–99)
Potassium: 2.8 mmol/L — ABNORMAL LOW (ref 3.5–5.1)
Sodium: 137 mmol/L (ref 135–145)

## 2021-05-13 LAB — CBC
HCT: 52.9 % — ABNORMAL HIGH (ref 39.0–52.0)
Hemoglobin: 17.4 g/dL — ABNORMAL HIGH (ref 13.0–17.0)
MCH: 27.4 pg (ref 26.0–34.0)
MCHC: 32.9 g/dL (ref 30.0–36.0)
MCV: 83.3 fL (ref 80.0–100.0)
Platelets: 341 10*3/uL (ref 150–400)
RBC: 6.35 MIL/uL — ABNORMAL HIGH (ref 4.22–5.81)
RDW: 14.1 % (ref 11.5–15.5)
WBC: 11.6 10*3/uL — ABNORMAL HIGH (ref 4.0–10.5)
nRBC: 0 % (ref 0.0–0.2)

## 2021-05-13 LAB — HEPATIC FUNCTION PANEL
ALT: 11 U/L (ref 0–44)
AST: 23 U/L (ref 15–41)
Albumin: 2.6 g/dL — ABNORMAL LOW (ref 3.5–5.0)
Alkaline Phosphatase: 32 U/L — ABNORMAL LOW (ref 38–126)
Bilirubin, Direct: 0.4 mg/dL — ABNORMAL HIGH (ref 0.0–0.2)
Indirect Bilirubin: 0.6 mg/dL (ref 0.3–0.9)
Total Bilirubin: 1 mg/dL (ref 0.3–1.2)
Total Protein: 5.3 g/dL — ABNORMAL LOW (ref 6.5–8.1)

## 2021-05-13 LAB — MAGNESIUM: Magnesium: 1.4 mg/dL — ABNORMAL LOW (ref 1.7–2.4)

## 2021-05-13 LAB — TROPONIN I (HIGH SENSITIVITY)
Troponin I (High Sensitivity): 1237 ng/L (ref <18)
Troponin I (High Sensitivity): 1397 ng/L (ref <18)
Troponin I (High Sensitivity): 1085 ng/L (ref <18)

## 2021-05-13 LAB — RESP PANEL BY RT-PCR (FLU A&B, COVID) ARPGX2
Influenza A by PCR: NEGATIVE
Influenza B by PCR: NEGATIVE
SARS Coronavirus 2 by RT PCR: NEGATIVE

## 2021-05-13 LAB — GLUCOSE, CAPILLARY: Glucose-Capillary: 103 mg/dL — ABNORMAL HIGH (ref 70–99)

## 2021-05-13 LAB — CBG MONITORING, ED: Glucose-Capillary: 188 mg/dL — ABNORMAL HIGH (ref 70–99)

## 2021-05-13 MED ORDER — ACETAMINOPHEN 500 MG PO TABS
1000.0000 mg | ORAL_TABLET | Freq: Once | ORAL | Status: AC
Start: 2021-05-13 — End: 2021-05-13
  Administered 2021-05-13: 1000 mg via ORAL
  Filled 2021-05-13: qty 2

## 2021-05-13 MED ORDER — ASPIRIN 81 MG PO CHEW
324.0000 mg | CHEWABLE_TABLET | Freq: Once | ORAL | Status: AC
  Administered 2021-05-13: 324 mg via ORAL
  Filled 2021-05-13: qty 4

## 2021-05-13 MED ORDER — POTASSIUM CHLORIDE CRYS ER 20 MEQ PO TBCR
40.0000 meq | EXTENDED_RELEASE_TABLET | Freq: Once | ORAL | Status: AC
  Administered 2021-05-13: 40 meq via ORAL
  Filled 2021-05-13: qty 2

## 2021-05-13 MED ORDER — MAGNESIUM SULFATE 2 GM/50ML IV SOLN
2.0000 g | Freq: Once | INTRAVENOUS | Status: AC
  Administered 2021-05-13: 2 g via INTRAVENOUS
  Filled 2021-05-13: qty 50

## 2021-05-13 MED ORDER — LACTATED RINGERS IV SOLN
INTRAVENOUS | Status: AC
Start: 2021-05-13 — End: 2021-05-14

## 2021-05-13 MED ORDER — POTASSIUM CHLORIDE CRYS ER 20 MEQ PO TBCR
40.0000 meq | EXTENDED_RELEASE_TABLET | Freq: Every day | ORAL | Status: AC
  Administered 2021-05-14: 40 meq via ORAL
  Filled 2021-05-13: qty 2

## 2021-05-13 MED ORDER — INSULIN ASPART 100 UNIT/ML IJ SOLN
0.0000 [IU] | Freq: Three times a day (TID) | INTRAMUSCULAR | Status: DC
  Filled 2021-05-13: qty 0.09

## 2021-05-13 MED ORDER — CALCIUM GLUCONATE-NACL 1-0.675 GM/50ML-% IV SOLN
1.0000 g | Freq: Once | INTRAVENOUS | Status: AC
  Administered 2021-05-13: 1000 mg via INTRAVENOUS
  Filled 2021-05-13: qty 50

## 2021-05-13 MED ORDER — POLYETHYLENE GLYCOL 3350 17 G PO PACK
17.0000 g | PACK | Freq: Every day | ORAL | Status: DC | PRN
  Administered 2021-05-13: 22:00:00 17 g via ORAL
  Filled 2021-05-13: qty 1

## 2021-05-13 MED ORDER — LACTATED RINGERS IV BOLUS
1000.0000 mL | Freq: Once | INTRAVENOUS | Status: AC
  Administered 2021-05-13: 1000 mL via INTRAVENOUS

## 2021-05-13 MED ORDER — ENOXAPARIN SODIUM 40 MG/0.4ML IJ SOSY
40.0000 mg | PREFILLED_SYRINGE | INTRAMUSCULAR | Status: DC
  Filled 2021-05-13: qty 0.4

## 2021-05-13 MED ORDER — POTASSIUM CHLORIDE 10 MEQ/100ML IV SOLN
10.0000 meq | Freq: Once | INTRAVENOUS | Status: AC
  Administered 2021-05-13: 10 meq via INTRAVENOUS
  Filled 2021-05-13: qty 100

## 2021-05-13 MED ORDER — ACETAMINOPHEN 325 MG PO TABS
650.0000 mg | ORAL_TABLET | Freq: Four times a day (QID) | ORAL | Status: DC | PRN
  Administered 2021-05-14 (×3): 650 mg via ORAL
  Filled 2021-05-13 (×3): qty 2

## 2021-05-13 MED ORDER — SODIUM CHLORIDE 0.9% FLUSH
3.0000 mL | Freq: Two times a day (BID) | INTRAVENOUS | Status: DC
  Administered 2021-05-14 – 2021-05-16 (×5): 3 mL via INTRAVENOUS

## 2021-05-13 MED ORDER — ACETAMINOPHEN 650 MG RE SUPP: 650.0000 mg | Freq: Four times a day (QID) | RECTAL | Status: DC | PRN

## 2021-05-13 NOTE — ED Triage Notes (Signed)
Patient BIBA from home c/o syncopal episode witnessed by wife upon getting out of the car. Patient able to ambulate after episode and Aox4. Denies hitting head, no hx blood thinners.  ?Hx DM 2, HTN.  ? ? ?BP 70/55  ?HR 130 ?CBG 180 ?SpO2 100% Ra ? ?1000 cc LR  ?20 G LAC  ?

## 2021-05-13 NOTE — H&P (Signed)
History and Physical   David [name] [id number] DOB: 20-Jun-1980 DOA: 05/13/2021  PCP: Kerin Perna, NP   Patient coming from: Home  Chief Complaint: Syncope  HPI: David [name] is a 41 y.o. male with medical history significant of GI bleed, diabetes, substance use, hypertension, gastritis alcohol use with withdrawal presenting with syncope.  Patient had couple days of nausea and vomiting.  He then hadsyncope today getting out of the car.  He had a prodrome of feel like he was going to pass out prior to passing out.  He did hit his head on the ground.  Has had prior history of seizures but no seizure-like activity with this syncopal episode.  Reports his ongoing issues with the lightheadedness after being put on clonidine.  He was placed on left at the Vomiting of his blood pressure medicines due to cyclic vomiting issues.  As far as his alcohol use goes his last drink was Monday or Tuesday. Denies cocaine use.  He denies fevers, chills, chest pain, shortness of breath, abdominal pain, constipation, diarrhea.  ED Course: Vital signs in the ED significant for heart rate in the 90s to 100s, blood pressure in the 123XX123 systolic.  Lab work-up included CMP which showed potassium 2.8, bicarb 21, BUN 29, creatinine elevated 1.60 from baseline around 0.8, glucose 170, calcium 6.5, corrected 7.6 with albumin of 2.6, protein 5.3.  Hemoglobin elevated at 17.4 and leukocytosis to 11.6 likely hemoconcentration component.  Troponin elevated to 1237 with repeat pending.  Respiratory panel for flu and COVID-negative.  CT head showed no acute abnormality but did demonstrate bubbly fluid in the right glenoid sinus which is nonspecific finding.  Urinalysis and UDS pending.  Cardiology consulted and will evaluate the patient but suspect troponin elevation is demand related.  Patient received Tylenol, aspirin, 10 mEq of potassium IV and 40 mEq of potassium p.o., 2 g magnesium, 1 g calcium gluconate, 1 L of  IV fluids.  Review of Systems: As per HPI otherwise all other systems reviewed and are negative.  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 He has a 10.00 pack-year smoking history. He has never used smokeless tobacco. He reports current alcohol use of about 30.0 standard drinks per week. He reports current drug use. Drugs: Oxycodone and Marijuana.  No Known Allergies  Family History  Problem Relation Age of Onset   Diabetes Father   Reviewed on admission  Prior to Admission medications   Medication Sig Start Date End Date Taking? Authorizing Provider  cloNIDine (CATAPRES - DOSED IN MG/24 HR) 0.2 mg/24hr patch Place 1 patch (0.2 mg total) onto the skin once a week. 01/13/21   Kerin Perna, NP  insulin aspart (NOVOLOG) 100 UNIT/ML injection For blood sugars 0-150 give 0 units of insulin, 200-250 give 4 units of insulin, 251-300 give 6 units, 301-350 give 8 units, 351- 400 give 10 units, > 400 give 12 units and call M.D. Discussed hypoglycemia  protocol. 01/13/21   Kerin Perna, NP  Insulin Pen Needle (PEN NEEDLES 31GX5/16") 31G X 8 MM MISC As directed 08/16/20   Kathie Dike, MD  omeprazole (PRILOSEC) 20 MG capsule Take by mouth 2 (two) times daily. Patient not taking: Reported on 01/13/2021 09/23/20   [provider]  ondansetron (ZOFRAN-ODT) 4 MG disintegrating tablet DISSOLVE 1 TABLET(4 MG) ON THE TONGUE EVERY 8 HOURS AS NEEDED FOR NAUSEA OR VOMITING  12/07/20   Kerin Perna, NP    Physical Exam: Vitals:   05/13/21 1815 05/13/21 1830 05/13/21 1900 05/13/21 1915  BP: 108/84 (!) 109/93 (!) 114/92 (!) 113/92  Pulse: 94 90 81 88  Resp: (!) 23 15 19 17   Temp:      TempSrc:      SpO2: 94% 97% 99% 98%  Weight:      Height:        Physical Exam Constitutional:      General: He is not in acute distress.    Appearance: Normal appearance.  HENT:     Head: Normocephalic and atraumatic.     Mouth/Throat:     Mouth: Mucous membranes are moist.     Pharynx: Oropharynx is clear.  Eyes:     Extraocular Movements: Extraocular movements intact.     Pupils: Pupils are equal, round, and reactive to light.  Cardiovascular:     Rate and Rhythm: Normal rate and regular rhythm.     Pulses: Normal pulses.     Heart sounds: Normal heart sounds.  Pulmonary:     Effort: Pulmonary effort is normal. No respiratory distress.     Breath sounds: Normal breath sounds.  Abdominal:     General: Bowel sounds are normal. There is no distension.     Palpations: Abdomen is soft.     Tenderness: There is no abdominal tenderness.  Musculoskeletal:        General: No swelling or deformity.  Skin:    General: Skin is warm and dry.  Neurological:     General: No focal deficit present.     Mental Status: Mental status is at baseline.   Labs on Admission: I have personally reviewed following labs and imaging studies  CBC: Recent Labs  Lab 05/13/21 1652  WBC 11.6*  HGB 17.4*  HCT 52.9*  MCV 83.3  PLT [id number]    Basic Metabolic Panel: Recent Labs  Lab 05/13/21 1652  NA 137  K 2.8*  CL 109  CO2 21*  GLUCOSE 170*  BUN 29*  CREATININE 1.62*  CALCIUM 6.5*  MG 1.4*    GFR: Estimated Creatinine Clearance: 55.8 mL/min (A) (by C-G formula based on SCr of 1.62 mg/dL (H)).  Liver Function Tests: Recent Labs  Lab 05/13/21 1652  AST 23  ALT 11  ALKPHOS 32*  BILITOT 1.0  PROT 5.3*  ALBUMIN 2.6*    Urine analysis:    Component Value  Date/Time   COLORURINE YELLOW 09/21/2020 1753   APPEARANCEUR CLEAR 09/21/2020 1753   LABSPEC 1.013 09/21/2020 1753   PHURINE 7.0 09/21/2020 1753   GLUCOSEU NEGATIVE 09/21/2020 1753   HGBUR SMALL (A) 09/21/2020 1753   BILIRUBINUR NEGATIVE 09/21/2020 1753   BILIRUBINUR negative 07/28/2015 1438   BILIRUBINUR Small 06/15/2014 1841   KETONESUR 5 (A) 09/21/2020 1753   PROTEINUR NEGATIVE 09/21/2020 1753   UROBILINOGEN 0.2 07/28/2015 1438   UROBILINOGEN 0.2 09/23/2014 1009   NITRITE NEGATIVE 09/21/2020 1753   LEUKOCYTESUR NEGATIVE 09/21/2020 1753    Radiological Exams on Admission:  CT Head Wo Contrast  Result Date: 05/13/2021 CLINICAL DATA:  Syncope, cerebrovascular cause suspected EXAM: CT HEAD WITHOUT CONTRAST TECHNIQUE: Contiguous axial images were obtained from the base of the skull through the vertex without intravenous contrast. RADIATION DOSE REDUCTION: This exam was performed according to the departmental dose-optimization program which includes automated exposure control, adjustment of the mA and/or kV according to patient size and/or use of iterative reconstruction technique. COMPARISON:  05/05/2006 FINDINGS: Brain: No evidence of acute infarction, hemorrhage, cerebral edema, mass, mass effect, or midline shift. No hydrocephalus or extra-axial fluid collection. Vascular: No hyperdense vessel. Skull: Normal. Negative for fracture or focal lesion. Sinuses/Orbits: The orbits are unremarkable. Mucosal thickening in the left maxillary sinus, ethmoid air cells and right sphenoid sinus. Bubbly fluid in the right sphenoid sinus. Other: The mastoid air cells are well aerated. IMPRESSION: IMPRESSION 1.  No acute intracranial process. 2. Bubbly fluid in the right sphenoid sinus, which is nonspecific but can be seen in the setting of acute sinusitis. Correlate with symptoms. Electronically Signed   By: Merilyn Baba M.D.   On: 05/13/2021 17:38    EKG: Independently reviewed.  Sinus tachycardia at 107  bpm.  The lateral lead T wave inversions appear peaked. .  QRS mildly prolonged at 106, QTc prolonged at 602.  Assessment/Plan Principal Problem:   Hypokalemia Active Problems:   Type 2 diabetes mellitus without complication, with long-term current use of insulin (HCC)   Essential hypertension   AKI (acute kidney injury) ([place])   Polysubstance abuse ([place])   Hypomagnesemia   Hypocalcemia   Troponin level elevated   Hypokalemia Hypomagnesemia  AKI > Patient initially presented for syncopal episode as below.  Found to have incidental hypokalemia, hypomagnesemia, AKI. > Potassium 2.8, magnesium 1.4, calcium 6.5 but corrects to 7.6 considering albumin of 2.6. > Creatinine elevated to 1.62 from baseline around 0.8 > Receiving repletion in the ED with 2 g magnesium, 10 mill equivalents IV potassium, 40 mEq p.o. potassium, 1 g calcium gluconate. - Monitor on telemetry - Add additional 40 mEq of potassium in the morning - Trend renal function and electrolytes  Syncope > Syncopal episode when getting out of the car. > Has had issues with intermittent lightheadedness since being started on clonidine (which he was started on due to cyclic vomiting causing difficulty keeping down pills) > Has AKI as above, blood pressure borderline in the 123XX123 systolic. > Likely orthostatic hypotension, given prodrome of feeling lightheaded/like he was going to pass out before he did. - Monitor on telemetry - Orthostatic vital signs, though has received fluids already - Echocardiogram  Troponin elevation > Troponin elevated to 1237 in ED. > Cardiology consulted expected likely demand related, will see the patient in consult however tomorrow - Appreciate cardiology recommendations - Trend troponin - Getting echocardiogram as above  HTN > On clonidine as above due to issues with keeping down antihypertensive pills in the setting of cyclical vomiting. - Holding clonidine for now given syncopal episode and  low normal blood pressure.  Leukocytosis Erythrocytosis > Leukocytosis and erythrocytosis are likely hemoconcentration related given dehydration and AKI.  Versus a degree of reactive leukocytosis given his fall. - Trend CBC  Diabetes - SSI  Substance use Alcohol use with history of withdrawal > Last drink Tuesday without evidence of withdrawal - Continue to monitor - UDS Pending  DVT prophylaxis: Lovenox Code Status:   Full Family Communication:  Updated at bedside  Disposition Plan:   Patient is from:  Home  Anticipated  DC to:  Home  Anticipated DC date:  1 to 3 days  Anticipated DC barriers: None  Consults called:  Cardiology, consulted by EDP, will see the patient in the morning Admission status:  Observation, telemetry  Severity of Illness: The appropriate patient status for this patient is OBSERVATION. Observation status is judged to be reasonable and necessary in order to provide the required intensity of service to ensure the patient's safety. The patient's presenting symptoms, physical exam findings, and initial radiographic and laboratory data in the context of their medical condition is felt to place them at decreased risk for further clinical deterioration. Furthermore, it is anticipated that the patient will be medically stable for discharge from the hospital within 2 midnights of admission.    Marcelyn Bruins MD [facility]  How to contact the [facility] Attending or Consulting provider [place] or covering provider during after hours [place], for this patient?   Check the care team in [facility] and look for a) attending/consulting [facility] provider listed and b) the [facility] team listed Log into [email or web address] and use [place]'s universal password to access. If you do not have the password, please contact the hospital operator. Locate the [facility] provider you are looking for under [facility] and page to a number that you can be directly reached. If you still have  difficulty reaching the provider, please page the [facility] (Director on Call) for the Hospitalists listed on amion for assistance.  05/13/2021, 7:38 PM

## 2021-05-13 NOTE — ED Notes (Signed)
RN had in depth conversation with pt regarding substance use. He reports he handles cocaine/crack on regular basis but does not smoke or snort it. He does endorse percocet use occasional. Denies use of all other stimulants.  ?

## 2021-05-13 NOTE — Progress Notes (Signed)
Pt has a large ambu type bag of cash laying in bed. I instructed pt that the hospital is not responsible for any valuables that he keeps at the hospital. Male at bedside that pt states is his wife. Pt's ER Rn notified and ER charge rn Jake notified. ?TDarcel Smalling BSN, RN-BC ?Admissions RN ?05/13/2021 ?7:50 PM ? ?

## 2021-05-13 NOTE — ED Provider Notes (Signed)
[facility]-EMERGENCY DEPT Provider Note   CSN: [id number] Arrival date & time: 05/13/21  1628     History  Chief Complaint  Patient presents with   Loss of Consciousness    David [name] is a 41 y.o. male with diabetes, hypertension, cyclic vomiting.  Patient presents to ED for evaluation of syncopal episode.  Per patient, patient was having nausea and vomiting on Wednesday which continued into Thursday.  Patient reports going out on Tuesday night and drinking liquor, taking Percocet pill.  Patient reports that his vomiting continued into Thursday.  Patient reports that today he was with his wife running errands when he felt like "he was going to fall out".  Patient reports that when he attempted to get out of the car to go shopping with his wife he passed out striking his head.  Patient denies being on blood thinners.  Patient states that before he passed out he had an episode of lightheadedness, dizziness, weakness.  Patient states that he has had transient episodes of lightheadedness and dizziness since being placed on blood pressure patch 1 month ago.  Patient states he has a history of seizures however his wife denied any seizure-like activity being noted today.  Patient denies taking medications for seizures.  Patient states his last seizure was 2 months ago.  Patient endorsing lightheadedness and dizziness.  Patient denies nausea, vomiting, chest pain, shortness of breath, headache, blurred vision, abdominal pain, fevers.    Loss of Consciousness Associated symptoms: dizziness, nausea, vomiting and weakness   Associated symptoms: no chest pain, no fever, no headaches, no seizures and no shortness of breath       Home Medications Prior to Admission medications   Medication Sig Start Date End Date Taking? Authorizing Provider  cloNIDine (CATAPRES - DOSED IN MG/24 HR) 0.2 mg/24hr patch Place 1 patch (0.2 mg total) onto the skin once a week. 01/13/21   Grayce Sessions, NP  insulin aspart (NOVOLOG) 100 UNIT/ML injection For blood sugars 0-150 give 0 units of insulin, 200-250 give 4 units of insulin, 251-300 give 6 units, 301-350 give 8 units, 351- 400 give 10 units, > 400 give 12 units and call M.D. Discussed hypoglycemia protocol. 01/13/21   Grayce Sessions, NP  Insulin Pen Needle (PEN NEEDLES 31GX5/16") 31G X 8 MM MISC As directed 08/16/20   Erick Blinks, MD  omeprazole (PRILOSEC) 20 MG capsule Take by mouth 2 (two) times daily. Patient not taking: Reported on 01/13/2021 09/23/20   [provider]  ondansetron (ZOFRAN-ODT) 4 MG disintegrating tablet DISSOLVE 1 TABLET(4 MG) ON THE TONGUE EVERY 8 HOURS AS NEEDED FOR NAUSEA OR VOMITING 12/07/20   Grayce Sessions, NP      Allergies    Patient has no known allergies.    Review of Systems   Review of Systems  Constitutional:  Negative for chills and fever.  Eyes:  Negative for visual disturbance.  Respiratory:  Negative for shortness of breath.   Cardiovascular:  Positive for syncope. Negative for chest pain.  Gastrointestinal:  Positive for nausea and vomiting. Negative for abdominal pain.  Neurological:  Positive for dizziness, syncope, weakness and light-headedness. Negative for seizures and headaches.  All other systems reviewed and are negative.  Physical Exam Updated Vital Signs BP (!) 113/92    Pulse 88    Temp 97.6 F (36.4 C) (Oral)    Resp 17    Ht 5\' 6"  (1.676 m)    Wt 65.8 kg  SpO2 98%    BMI 23.40 kg/m  Physical Exam Vitals and nursing note reviewed.  Constitutional:      General: He is not in acute distress.    Appearance: He is not ill-appearing, toxic-appearing or diaphoretic.  HENT:     Head: Normocephalic and atraumatic.     Nose: Nose normal.     Mouth/Throat:     Mouth: Mucous membranes are moist.     Dentition: Abnormal dentition. Dental caries present.  Eyes:     Extraocular Movements: Extraocular movements intact.     Conjunctiva/sclera:  Conjunctivae normal.     Pupils: Pupils are equal, round, and reactive to light.  Cardiovascular:     Rate and Rhythm: Normal rate and regular rhythm.  Pulmonary:     Effort: Pulmonary effort is normal.     Breath sounds: Normal breath sounds. No wheezing.  Abdominal:     General: Abdomen is flat. Bowel sounds are normal.     Palpations: Abdomen is soft.     Tenderness: There is no abdominal tenderness.  Musculoskeletal:     Cervical back: Normal range of motion and neck supple. No tenderness.  Skin:    General: Skin is warm and dry.     Capillary Refill: Capillary refill takes less than 2 seconds.  Neurological:     Mental Status: He is alert and oriented to person, place, and time.    ED Results / Procedures / Treatments   Labs (all labs ordered are listed, but only abnormal results are displayed) Labs Reviewed  BASIC METABOLIC PANEL - Abnormal; Notable for the following components:      Result Value   Potassium 2.8 (*)    CO2 21 (*)    Glucose, Bld 170 (*)    BUN 29 (*)    Creatinine, Ser 1.62 (*)    Calcium 6.5 (*)    GFR, Estimated 55 (*)    All other components within normal limits  CBC - Abnormal; Notable for the following components:   WBC 11.6 (*)    RBC 6.35 (*)    Hemoglobin 17.4 (*)    HCT 52.9 (*)    All other components within normal limits  MAGNESIUM - Abnormal; Notable for the following components:   Magnesium 1.4 (*)    All other components within normal limits  HEPATIC FUNCTION PANEL - Abnormal; Notable for the following components:   Total Protein 5.3 (*)    Albumin 2.6 (*)    Alkaline Phosphatase 32 (*)    Bilirubin, Direct 0.4 (*)    All other components within normal limits  CBG MONITORING, ED - Abnormal; Notable for the following components:   Glucose-Capillary 188 (*)    All other components within normal limits  TROPONIN I (HIGH SENSITIVITY) - Abnormal; Notable for the following components:   Troponin I (High Sensitivity) 1,237 (*)    All  other components within normal limits  RESP PANEL BY RT-PCR (FLU A&B, COVID) ARPGX2  URINALYSIS, ROUTINE W REFLEX MICROSCOPIC  RAPID URINE DRUG SCREEN, HOSP PERFORMED  CBC  BASIC METABOLIC PANEL  BASIC METABOLIC PANEL  MAGNESIUM  COMPREHENSIVE METABOLIC PANEL  CBG MONITORING, ED  TROPONIN I (HIGH SENSITIVITY)    EKG EKG Interpretation  Date/Time:  Friday May 13 2021 16:53:58 EST Ventricular Rate:  107 PR Interval:  154 QRS Duration: 106 QT Interval:  451 QTC Calculation: 602 R Axis:   -55 Text Interpretation: Sinus tachycardia LAE, consider biatrial enlargement Left anterior fascicular block Abnormal  T, probable ischemia, widespread Prolonged QT interval diffuse deep T wave inversions new since June 2022 Confirmed by Pricilla Loveless [phone]) on 05/13/2021 5:04:54 PM  Radiology CT Head Wo Contrast  Result Date: 05/13/2021 CLINICAL DATA:  Syncope, cerebrovascular cause suspected EXAM: CT HEAD WITHOUT CONTRAST TECHNIQUE: Contiguous axial images were obtained from the base of the skull through the vertex without intravenous contrast. RADIATION DOSE REDUCTION: This exam was performed according to the departmental dose-optimization program which includes automated exposure control, adjustment of the mA and/or kV according to patient size and/or use of iterative reconstruction technique. COMPARISON:  05/05/2006 FINDINGS: Brain: No evidence of acute infarction, hemorrhage, cerebral edema, mass, mass effect, or midline shift. No hydrocephalus or extra-axial fluid collection. Vascular: No hyperdense vessel. Skull: Normal. Negative for fracture or focal lesion. Sinuses/Orbits: The orbits are unremarkable. Mucosal thickening in the left maxillary sinus, ethmoid air cells and right sphenoid sinus. Bubbly fluid in the right sphenoid sinus. Other: The mastoid air cells are well aerated. IMPRESSION: IMPRESSION 1.  No acute intracranial process. 2. Bubbly fluid in the right sphenoid sinus, which is  nonspecific but can be seen in the setting of acute sinusitis. Correlate with symptoms. Electronically Signed   By: Wiliam Ke M.D.   On: 05/13/2021 17:38    Procedures Procedures    Medications Ordered in ED Medications  magnesium sulfate IVPB 2 g 50 mL (2 g Intravenous New Bag/Given 05/13/21 1904)  calcium gluconate 1 g/ 50 mL sodium chloride IVPB (has no administration in time range)  enoxaparin (LOVENOX) injection 40 mg (has no administration in time range)  sodium chloride flush (NS) 0.9 % injection 3 mL (has no administration in time range)  acetaminophen (TYLENOL) tablet 650 mg (has no administration in time range)    Or  acetaminophen (TYLENOL) suppository 650 mg (has no administration in time range)  polyethylene glycol (MIRALAX / GLYCOLAX) packet 17 g (has no administration in time range)  lactated ringers bolus 1,000 mL (1,000 mLs Intravenous New Bag/Given (Non-Interop) 05/13/21 1737)  potassium chloride SA (KLOR-CON M) CR tablet 40 mEq (40 mEq Oral Given 05/13/21 1829)  potassium chloride 10 mEq in 100 mL IVPB (10 mEq Intravenous New Bag/Given 05/13/21 1827)  acetaminophen (TYLENOL) tablet 1,000 mg (1,000 mg Oral Given 05/13/21 1857)  aspirin chewable tablet 324 mg (324 mg Oral Given 05/13/21 1858)    ED Course/ Medical Decision Making/ A&P                           Medical Decision Making Amount and/or Complexity of Data Reviewed Labs: ordered. Radiology: ordered.  Risk Prescription drug management.   41 year old male presents ED for evaluation of syncopal episode.  Please see HPI for further details. On examination, the patient is afebrile, nontachycardic, nonhypoxic, clear lung sounds bilaterally, soft compressible abdomen.  Patient nontoxic in appearance.  Patient initial set of vital signs shows hypotension thought to be secondary to hypovolemia.  Patient reports excessive nausea and vomiting 2 days leading up to visit today.  Patient worked up utilizing following  labs and imaging studies personally interpreted by me: - CT head shows no signs of intracranial abnormality, midline shift, herniation - Initial troponin 1237.  Dr. Duke Salvia of cardiology was consulted, she believes this is secondary to demand ischemia - Hepatic function panel shows decreased albumin to 2.6, corrected shows 7.5. - Magnesium decreased 1.4 - CBC shows elevated white blood cell count 11.6 - BMP shows decreased potassium at 2.8,  elevated BUN 29, decreased calcium of 6.5, elevated creatinine 1.62, decreased GFR to 55.  Most likely due to hypovolemia. -EKG shows inverted T waves along with prolonged QT  Patient treated utilizing following medications: 324 aspirin, 1000 mg acetaminophen, 40 mEq potassium chloride, 2 g mag sulfate, 10 mill equivalents potassium chloride IV, calcium gluconate 1 g, 1 L normal saline.  Dr. Duke Salvia cardiology believes patient elevated troponin due to secondary demand ischemia.  Dr. Duke Salvia has agreed to consult on the patient tomorrow.  She has requested medicine admission.  Dr. Alinda Money, internal medicine, accepted admission.  Patient stable at time admission.   Final Clinical Impression(s) / ED Diagnoses Final diagnoses:  Acute kidney injury (HCC)  Hypokalemia  Hypomagnesemia  Hypoalbuminemia    Rx / DC Orders ED Discharge Orders     None         Al Decant, PA-C 05/13/21 1930    Pricilla Loveless, MD 05/13/21 2235

## 2021-05-14 ENCOUNTER — Observation Stay (HOSPITAL_COMMUNITY): Payer: Medicaid Other

## 2021-05-14 DIAGNOSIS — I513 Intracardiac thrombosis, not elsewhere classified: Principal | ICD-10-CM

## 2021-05-14 DIAGNOSIS — E8809 Other disorders of plasma-protein metabolism, not elsewhere classified: Secondary | ICD-10-CM | POA: Diagnosis present

## 2021-05-14 DIAGNOSIS — R9431 Abnormal electrocardiogram [ECG] [EKG]: Secondary | ICD-10-CM | POA: Diagnosis present

## 2021-05-14 DIAGNOSIS — F1910 Other psychoactive substance abuse, uncomplicated: Secondary | ICD-10-CM | POA: Diagnosis not present

## 2021-05-14 DIAGNOSIS — F1010 Alcohol abuse, uncomplicated: Secondary | ICD-10-CM | POA: Diagnosis present

## 2021-05-14 DIAGNOSIS — R778 Other specified abnormalities of plasma proteins: Secondary | ICD-10-CM | POA: Diagnosis not present

## 2021-05-14 DIAGNOSIS — I248 Other forms of acute ischemic heart disease: Secondary | ICD-10-CM | POA: Diagnosis present

## 2021-05-14 DIAGNOSIS — R7989 Other specified abnormal findings of blood chemistry: Secondary | ICD-10-CM | POA: Diagnosis not present

## 2021-05-14 DIAGNOSIS — Z20822 Contact with and (suspected) exposure to covid-19: Secondary | ICD-10-CM | POA: Diagnosis present

## 2021-05-14 DIAGNOSIS — N179 Acute kidney failure, unspecified: Secondary | ICD-10-CM | POA: Diagnosis present

## 2021-05-14 DIAGNOSIS — E860 Dehydration: Secondary | ICD-10-CM | POA: Diagnosis present

## 2021-05-14 DIAGNOSIS — I110 Hypertensive heart disease with heart failure: Secondary | ICD-10-CM | POA: Diagnosis present

## 2021-05-14 DIAGNOSIS — D72828 Other elevated white blood cell count: Secondary | ICD-10-CM | POA: Diagnosis present

## 2021-05-14 DIAGNOSIS — E1165 Type 2 diabetes mellitus with hyperglycemia: Secondary | ICD-10-CM | POA: Diagnosis present

## 2021-05-14 DIAGNOSIS — I5021 Acute systolic (congestive) heart failure: Secondary | ICD-10-CM | POA: Diagnosis not present

## 2021-05-14 DIAGNOSIS — Z8719 Personal history of other diseases of the digestive system: Secondary | ICD-10-CM | POA: Diagnosis not present

## 2021-05-14 DIAGNOSIS — I5041 Acute combined systolic (congestive) and diastolic (congestive) heart failure: Secondary | ICD-10-CM | POA: Diagnosis present

## 2021-05-14 DIAGNOSIS — I420 Dilated cardiomyopathy: Secondary | ICD-10-CM | POA: Diagnosis present

## 2021-05-14 DIAGNOSIS — E876 Hypokalemia: Secondary | ICD-10-CM | POA: Diagnosis present

## 2021-05-14 DIAGNOSIS — I429 Cardiomyopathy, unspecified: Secondary | ICD-10-CM

## 2021-05-14 DIAGNOSIS — F17210 Nicotine dependence, cigarettes, uncomplicated: Secondary | ICD-10-CM | POA: Diagnosis present

## 2021-05-14 DIAGNOSIS — I255 Ischemic cardiomyopathy: Secondary | ICD-10-CM | POA: Diagnosis present

## 2021-05-14 DIAGNOSIS — R55 Syncope and collapse: Secondary | ICD-10-CM | POA: Diagnosis not present

## 2021-05-14 DIAGNOSIS — F1410 Cocaine abuse, uncomplicated: Secondary | ICD-10-CM | POA: Diagnosis present

## 2021-05-14 DIAGNOSIS — I5189 Other ill-defined heart diseases: Secondary | ICD-10-CM | POA: Diagnosis not present

## 2021-05-14 DIAGNOSIS — I951 Orthostatic hypotension: Secondary | ICD-10-CM | POA: Diagnosis present

## 2021-05-14 DIAGNOSIS — Z794 Long term (current) use of insulin: Secondary | ICD-10-CM | POA: Diagnosis not present

## 2021-05-14 DIAGNOSIS — R1115 Cyclical vomiting syndrome unrelated to migraine: Secondary | ICD-10-CM | POA: Diagnosis present

## 2021-05-14 DIAGNOSIS — D751 Secondary polycythemia: Secondary | ICD-10-CM | POA: Diagnosis present

## 2021-05-14 LAB — ECHOCARDIOGRAM COMPLETE
Area-P 1/2: 3.81 cm2
Calc EF: 33.1 %
Height: 66 in
S' Lateral: 4.6 cm
Single Plane A2C EF: 26.4 %
Single Plane A4C EF: 39.7 %
Weight: 2441.6 oz

## 2021-05-14 LAB — COMPREHENSIVE METABOLIC PANEL
ALT: 19 U/L (ref 0–44)
AST: 46 U/L — ABNORMAL HIGH (ref 15–41)
Albumin: 3.2 g/dL — ABNORMAL LOW (ref 3.5–5.0)
Alkaline Phosphatase: 40 U/L (ref 38–126)
Anion gap: 19 — ABNORMAL HIGH (ref 5–15)
BUN: 30 mg/dL — ABNORMAL HIGH (ref 6–20)
CO2: 24 mmol/L (ref 22–32)
Calcium: 9.2 mg/dL (ref 8.9–10.3)
Chloride: 97 mmol/L — ABNORMAL LOW (ref 98–111)
Creatinine, Ser: 1.21 mg/dL (ref 0.61–1.24)
GFR, Estimated: >60 mL/min (ref >60)
Glucose, Bld: 87 mg/dL (ref 70–99)
Potassium: 4.4 mmol/L (ref 3.5–5.1)
Sodium: 140 mmol/L (ref 135–145)
Total Bilirubin: 1.2 mg/dL (ref 0.3–1.2)
Total Protein: 6.5 g/dL (ref 6.5–8.1)

## 2021-05-14 LAB — GLUCOSE, CAPILLARY
Glucose-Capillary: 107 mg/dL — ABNORMAL HIGH (ref 70–99)
Glucose-Capillary: 104 mg/dL — ABNORMAL HIGH (ref 70–99)
Glucose-Capillary: 111 mg/dL — ABNORMAL HIGH (ref 70–99)
Glucose-Capillary: 120 mg/dL — ABNORMAL HIGH (ref 70–99)

## 2021-05-14 LAB — MAGNESIUM: Magnesium: 2.6 mg/dL — ABNORMAL HIGH (ref 1.7–2.4)

## 2021-05-14 LAB — BASIC METABOLIC PANEL
Anion gap: 8 (ref 5–15)
BUN: 34 mg/dL — ABNORMAL HIGH (ref 6–20)
CO2: 25 mmol/L (ref 22–32)
Calcium: 8.6 mg/dL — ABNORMAL LOW (ref 8.9–10.3)
Chloride: 101 mmol/L (ref 98–111)
Creatinine, Ser: 1.45 mg/dL — ABNORMAL HIGH (ref 0.61–1.24)
GFR, Estimated: >60 mL/min (ref >60)
Glucose, Bld: 145 mg/dL — ABNORMAL HIGH (ref 70–99)
Potassium: 3.7 mmol/L (ref 3.5–5.1)
Sodium: 134 mmol/L — ABNORMAL LOW (ref 135–145)

## 2021-05-14 LAB — URINALYSIS, ROUTINE W REFLEX MICROSCOPIC
Bacteria, UA: NONE SEEN
Bilirubin Urine: NEGATIVE
Glucose, UA: 50 mg/dL — AB
Hgb urine dipstick: NEGATIVE
Ketones, ur: NEGATIVE mg/dL
Leukocytes,Ua: NEGATIVE
Nitrite: NEGATIVE
Protein, ur: 30 mg/dL — AB
Specific Gravity, Urine: 1.017 (ref 1.005–1.030)
pH: 5 (ref 5.0–8.0)

## 2021-05-14 LAB — CBC
HCT: 50.5 % (ref 39.0–52.0)
Hemoglobin: 16.5 g/dL (ref 13.0–17.0)
MCH: 27.5 pg (ref 26.0–34.0)
MCHC: 32.7 g/dL (ref 30.0–36.0)
MCV: 84.3 fL (ref 80.0–100.0)
Platelets: 300 10*3/uL (ref 150–400)
RBC: 5.99 MIL/uL — ABNORMAL HIGH (ref 4.22–5.81)
RDW: 14 % (ref 11.5–15.5)
WBC: 10.4 10*3/uL (ref 4.0–10.5)
nRBC: 0 % (ref 0.0–0.2)

## 2021-05-14 LAB — HEPARIN LEVEL (UNFRACTIONATED): Heparin Unfractionated: 0.33 IU/mL (ref 0.30–0.70)

## 2021-05-14 LAB — TROPONIN I (HIGH SENSITIVITY): Troponin I (High Sensitivity): 1110 ng/L (ref <18)

## 2021-05-14 LAB — RAPID URINE DRUG SCREEN, HOSP PERFORMED
Amphetamines: NOT DETECTED
Barbiturates: NOT DETECTED
Benzodiazepines: NOT DETECTED
Cocaine: POSITIVE — AB
Opiates: POSITIVE — AB
Tetrahydrocannabinol: NOT DETECTED

## 2021-05-14 MED ORDER — INSULIN ASPART 100 UNIT/ML IJ SOLN
0.0000 [IU] | Freq: Three times a day (TID) | INTRAMUSCULAR | Status: DC
  Administered 2021-05-15: 1 [IU] via SUBCUTANEOUS
  Administered 2021-05-15 (×2): 2 [IU] via SUBCUTANEOUS
  Administered 2021-05-16: 3 [IU] via SUBCUTANEOUS
  Administered 2021-05-17 (×2): 2 [IU] via SUBCUTANEOUS

## 2021-05-14 MED ORDER — PERFLUTREN LIPID MICROSPHERE
1.0000 mL | INTRAVENOUS | Status: AC | PRN
  Administered 2021-05-14: 2 mL via INTRAVENOUS
  Filled 2021-05-14: qty 10

## 2021-05-14 MED ORDER — HYDRALAZINE HCL 20 MG/ML IJ SOLN: 2.0000 mg | Freq: Four times a day (QID) | INTRAMUSCULAR | Status: DC | PRN

## 2021-05-14 MED ORDER — LORAZEPAM 2 MG/ML IJ SOLN: 1.0000 mg | INTRAMUSCULAR | Status: DC | PRN

## 2021-05-14 MED ORDER — INSULIN ASPART 100 UNIT/ML IJ SOLN: 0.0000 [IU] | Freq: Every day | INTRAMUSCULAR | Status: DC

## 2021-05-14 MED ORDER — ADULT MULTIVITAMIN W/MINERALS CH
1.0000 | ORAL_TABLET | Freq: Every day | ORAL | Status: DC
  Administered 2021-05-14 – 2021-05-17 (×4): 1 via ORAL
  Filled 2021-05-14 (×4): qty 1

## 2021-05-14 MED ORDER — THIAMINE HCL 100 MG PO TABS
100.0000 mg | ORAL_TABLET | Freq: Every day | ORAL | Status: DC
  Administered 2021-05-14 – 2021-05-17 (×4): 100 mg via ORAL
  Filled 2021-05-14 (×4): qty 1

## 2021-05-14 MED ORDER — HEPARIN BOLUS VIA INFUSION
2000.0000 [IU] | Freq: Once | INTRAVENOUS | Status: AC
  Administered 2021-05-14: 2000 [IU] via INTRAVENOUS
  Filled 2021-05-14: qty 2000

## 2021-05-14 MED ORDER — LORAZEPAM 1 MG PO TABS: 1.0000 mg | ORAL_TABLET | ORAL | Status: DC | PRN

## 2021-05-14 MED ORDER — FOLIC ACID 1 MG PO TABS
1.0000 mg | ORAL_TABLET | Freq: Every day | ORAL | Status: DC
  Administered 2021-05-14 – 2021-05-17 (×4): 1 mg via ORAL
  Filled 2021-05-14 (×4): qty 1

## 2021-05-14 MED ORDER — CARVEDILOL 3.125 MG PO TABS
3.1250 mg | ORAL_TABLET | Freq: Two times a day (BID) | ORAL | Status: DC
  Administered 2021-05-14 – 2021-05-17 (×6): 3.125 mg via ORAL
  Filled 2021-05-14 (×6): qty 1

## 2021-05-14 MED ORDER — MUPIROCIN 2 % EX OINT
1.0000 "application " | TOPICAL_OINTMENT | Freq: Two times a day (BID) | CUTANEOUS | Status: DC
  Administered 2021-05-14 – 2021-05-16 (×5): 1 via NASAL
  Filled 2021-05-14: qty 22

## 2021-05-14 MED ORDER — CHLORHEXIDINE GLUCONATE CLOTH 2 % EX PADS
6.0000 | MEDICATED_PAD | Freq: Every day | CUTANEOUS | Status: DC
  Administered 2021-05-15 – 2021-05-16 (×2): 6 via TOPICAL

## 2021-05-14 MED ORDER — HEPARIN (PORCINE) 25000 UT/250ML-% IV SOLN
1300.0000 [IU]/h | INTRAVENOUS | Status: DC
  Administered 2021-05-14 – 2021-05-16 (×3): 1300 [IU]/h via INTRAVENOUS
  Filled 2021-05-14 (×4): qty 250

## 2021-05-14 MED ORDER — THIAMINE HCL 100 MG/ML IJ SOLN: 100.0000 mg | Freq: Every day | INTRAMUSCULAR | Status: DC

## 2021-05-14 MED ORDER — PANTOPRAZOLE SODIUM 40 MG IV SOLR
40.0000 mg | Freq: Two times a day (BID) | INTRAVENOUS | Status: DC
  Administered 2021-05-14 – 2021-05-16 (×5): 40 mg via INTRAVENOUS
  Filled 2021-05-14 (×5): qty 10

## 2021-05-14 NOTE — Progress Notes (Signed)
?  Echocardiogram ?2D Echocardiogram has been performed. ? ?Sheralyn Boatman R ?05/14/2021, 11:40 AM ?

## 2021-05-14 NOTE — Progress Notes (Addendum)
Call placed to [facility] [phone], regarding EKG update. Detailed message left with deshaun. No return call ?

## 2021-05-14 NOTE — Progress Notes (Addendum)
ANTICOAGULATION CONSULT NOTE - Initial Consult ? ?Pharmacy Consult for heparin ?Indication:  LV Thrombus ? ?No Known Allergies ? ?Patient Measurements: ?Height: 5\' 6"  (167.6 cm) ?Weight: 69.2 kg (152 lb 9.6 oz) ?IBW/kg (Calculated) : 63.8 ?Heparin Dosing Weight: n/a. Use TBW = 69 kg ? ?Vital Signs: ?Temp: 97.5 ?F (36.4 ?C) (03/11 02-21-2001) ?Temp Source: Oral (03/11 0528) ?BP: 120/108 (03/11 1322) ?Pulse Rate: 91 (03/11 1322) ? ?Labs: ?Recent Labs  ?  05/13/21 ?1652 05/13/21 ?1900 05/13/21 ?2202 05/13/21 ?2358 05/14/21 ?0416  ?HGB 17.4*  --   --   --  16.5  ?HCT 52.9*  --   --   --  50.5  ?PLT 341  --   --   --  300  ?CREATININE 1.62*  --   --  1.45* 1.21  ?TROPONINIHS 1,237* 07/14/21* 1,085* 1,110*  --   ? ? ?Estimated Creatinine Clearance: 74.7 mL/min (by C-G formula based on SCr of 1.21 mg/dL). ? ? ?Medical History: ?Past Medical History:  ?Diagnosis Date  ? Diabetes mellitus (HCC)   ? Hypertension   ? Metacarpal bone fracture 07/11/2015  ? right small  ? Neuropathy   ? Seizure with provoking factor (HCC)   ? 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 ? ?Medications: No anticoagulants PTA ? ?Assessment: ?Patient is a 29 yoM with PMH of T2DM, GIB, polysubstance abuse. Presented after syncopal episode, pharmacy consulted to dose heparin drip by cardiology for LV thrombus. ? ?Baseline labs: ?-Hgb 16.5 ?-Plt 300 ? ?05/14/21, Today ?Baseline CBC WNL ?SCr 1.21, CrCl 74 mL/min ? ?Goal of Therapy:  ?Heparin level 0.3-0.7 units/ml ?Monitor platelets by anticoagulation protocol: Yes ?  ?Plan:  ?Give 2000 units bolus x 1 ?Start heparin infusion at 1300 units/hr ?Check anti-Xa level in 6 hours and daily while on heparin ?Continue to monitor H&H and platelets ? ?07/14/21, PharmD ?05/14/2021,2:21 PM ? ?Addendum - Evening Follow Up: ? ?Assessment: ?HL = 0.33 is therapeutic on heparin infusion of 1300 units/hr ?Confirmed with RN that heparin infusing at correct rate. No interruptions/issues. No signs of bleeding.  ? ?Plan: ?Continue  heparin infusion at current rate of 1300 units/hr ?Check confirmatory HL in 6 hours ?CBC with AM labs tomorrow ?Monitor for signs of bleeding ? ?07/14/2021, PharmD ?05/14/21 ?8:35 PM ?

## 2021-05-14 NOTE — Progress Notes (Signed)
Orthosatic VS completed. Pt tolerated well and denied CP, SOB, dizziness or weakness. Will continue to monitor.  ?

## 2021-05-14 NOTE — Consult Note (Addendum)
Cardiology Consultation:   Patient ID: David [name] MRN: [id number]; DOB: 12/21/1980  Admit date: 05/13/2021 Date of Consult: 05/14/2021  PCP:  Grayce Sessions, NP   [facility] Providers Cardiologist:  None   Click here to update MD or APP on Care Team, Refresh:1}     Patient Profile:   David [name] is a 41 y.o. male with a hx of substance abuse, GI bleed/gastritis, type II diabetes on insulin who is being seen 05/14/2021 for the evaluation of  at the request of Dr. Margo Aye.  History of Present Illness:   David [name] notes nausea/vomiting for several days prior to presentation. On the day of admission, he was getting out of his car when he noted lightheadedness, and he then lost consciousness.  He does note issues with lightheadedness since starting clonidine (prescribed for cyclic vomiting syndrome)  On arrival to the ER, he was borderline tachycardic and hypotensive. ECG notable for diffuse deep T wave inversions, sinus tach at 107 bpm.  Lab workup notable for K of 2.8, Mg 1.4, Cr 1.6 (baseline 0.8), albumin 2.6, Ca 6.5, hs Tn 1237 > 1397 > 1085 >1110. CBC concerning for hemoconcentration. UDS positive for opiates and cocaine. A1c 6.7.  He tells me he is feeling much better after IV fluids and was hoping to be able to go home today. I discussed the results of his hsTn. He denies any chest pain or prior heart issues. Reviewed plans for echocardiogram. No shortness of breath, no palpitations, no edema.   Past Medical History:  Diagnosis Date   Diabetes mellitus (HCC)    Hypertension    Metacarpal bone fracture 07/11/2015   right small   Neuropathy    Seizure with provoking factor (HCC)    states seizure was due to alcohol     Past Surgical History:  Procedure Laterality Date   ESOPHAGOGASTRODUODENOSCOPY N/A 09/22/2020   Procedure: ESOPHAGOGASTRODUODENOSCOPY (EGD);  Surgeon: Charlott Rakes, MD;  Location: [facility];  Service: Endoscopy;  Laterality: N/A;    NO PAST SURGERIES       Home Medications:  Prior to Admission medications   Medication Sig Start Date End Date Taking? Authorizing Provider  cloNIDine (CATAPRES - DOSED IN MG/24 HR) 0.2 mg/24hr patch Place 1 patch (0.2 mg total) onto the skin once a week. 01/13/21   Grayce Sessions, NP  insulin aspart (NOVOLOG) 100 UNIT/ML injection For blood sugars 0-150 give 0 units of insulin, 200-250 give 4 units of insulin, 251-300 give 6 units, 301-350 give 8 units, 351- 400 give 10 units, > 400 give 12 units and call M.D. Discussed hypoglycemia protocol. 01/13/21   Grayce Sessions, NP  Insulin Pen Needle (PEN NEEDLES 31GX5/16") 31G X 8 MM MISC As directed 08/16/20   Erick Blinks, MD  omeprazole (PRILOSEC) 20 MG capsule Take by mouth 2 (two) times daily. Patient not taking: Reported on 01/13/2021 09/23/20   [provider]  ondansetron (ZOFRAN-ODT) 4 MG disintegrating tablet DISSOLVE 1 TABLET(4 MG) ON THE TONGUE EVERY 8 HOURS AS NEEDED FOR NAUSEA OR VOMITING 12/07/20   Grayce Sessions, NP    Inpatient Medications: Scheduled Meds:  enoxaparin (LOVENOX) injection  40 mg Subcutaneous Q24H   insulin aspart  0-9 Units Subcutaneous TID WC   potassium chloride  40 mEq Oral Daily   sodium chloride flush  3 mL Intravenous Q12H   Continuous Infusions:  lactated ringers 125 mL/hr at 05/14/21 0528   PRN Meds: acetaminophen **OR** acetaminophen, polyethylene glycol  Allergies:  No Known Allergies  Social History:   Social History   Socioeconomic History   Marital status: Married    Spouse name: Not on file   Number of children: Not on file   Years of education: Not on file   Highest education level: Not on file  Occupational History   Not on file  Tobacco Use   Smoking status: Every Day    Packs/day: 1.00    Years: 10.00    Pack years: 10.00    Types: Cigarettes   Smokeless tobacco: Never  Vaping Use   Vaping Use: Never used  Substance and Sexual Activity   Alcohol  use: Yes    Alcohol/week: 30.0 standard drinks    Types: 30 Shots of liquor per week    Comment: minimum 4 shots of liquor daily   Drug use: Yes    Types: Oxycodone, Marijuana   Sexual activity: Yes    Birth control/protection: Condom  Other Topics Concern   Not on file  Social History Narrative   Not on file   Social Determinants of Health   Financial Resource Strain: Not on file  Food Insecurity: Not on file  Transportation Needs: Not on file  Physical Activity: Not on file  Stress: Not on file  Social Connections: Not on file  Intimate Partner Violence: Not on file    Family History:    Family History  Problem Relation Age of Onset   Diabetes Father      ROS:  Please see the history of present illness.  Constitutional: Negative for chills, fever, night sweats, unintentional weight loss  HENT: Negative for ear pain and hearing loss.   Eyes: Negative for loss of vision and eye pain.  Respiratory: Negative for cough, sputum, wheezing.   Cardiovascular: See HPI. Gastrointestinal: Negative for abdominal pain, melena, and hematochezia.  Genitourinary: Negative for dysuria and hematuria.  Musculoskeletal: Negative for myalgias.  Skin: Negative for itching and rash.  Neurological: Negative for focal weakness, focal sensory changes. Positive for loss of consciousness Endo/Heme/Allergies: Does not bruise/bleed easily.   All other ROS reviewed and negative.     Physical Exam/Data:   Vitals:   05/13/21 1930 05/13/21 2141 05/14/21 0146 05/14/21 0528  BP: 113/88 115/84 98/78 115/87  Pulse: 94 86 97 82  Resp: 14 18 16 18   Temp:  98.2 F (36.8 C) 98.4 F (36.9 C) (!) 97.5 F (36.4 C)  TempSrc:  Oral Oral Oral  SpO2: 99% 100% 98% 100%  Weight:  69.2 kg    Height:  5\' 6"  (1.676 m)      Intake/Output Summary (Last 24 hours) at 05/14/2021 0850 Last data filed at 05/14/2021 0300 Gross per 24 hour  Intake 990.37 ml  Output 500 ml  Net 490.37 ml   Last 3 Weights  05/13/2021 05/13/2021 01/13/2021  Weight (lbs) 152 lb 9.6 oz 145 lb 156 lb 12.8 oz  Weight (kg) 69.219 kg 65.772 kg 71.124 kg     Body mass index is 24.63 kg/m.  General:  Well nourished, well developed, in no acute distress HEENT: normal Neck: no JVD Vascular: No carotid bruits; Distal pulses 2+ bilaterally Cardiac:  normal S1, S2; RRR; no murmur  Lungs:  clear to auscultation bilaterally, no wheezing, rhonchi or rales  Abd: soft, nontender, no hepatomegaly  Ext: no edema Musculoskeletal:  No deformities, BUE and BLE strength normal and equal Skin: warm and dry  Neuro:  CNs 2-12 intact, no focal abnormalities noted Psych:  Normal affect  EKG:  The EKG was personally reviewed and demonstrates:  diffuse deep T wave inversions, sinus tach at 107 bpm Telemetry:  Telemetry was personally reviewed and demonstrates:  sinus rhythm in the 90s/sinus tachycardia in the 100s-120s. Artifact, no significant ectopy noted, TWI  Relevant CV Studies: Echo is pending  Laboratory Data:  High Sensitivity Troponin:   Recent Labs  Lab 05/13/21 1652 05/13/21 1900 05/13/21 2202 05/13/21 2358  TROPONINIHS 1,237* 1,397* 1,085* 1,110*     Chemistry Recent Labs  Lab 05/13/21 1652 05/13/21 2358 05/14/21 0416  NA 137 134* 140  K 2.8* 3.7 4.4  CL 109 101 97*  CO2 21* 25 24  GLUCOSE 170* 145* 87  BUN 29* 34* 30*  CREATININE 1.62* 1.45* 1.21  CALCIUM 6.5* 8.6* 9.2  MG 1.4*  --  2.6*  GFRNONAA 55* >60 >60  ANIONGAP 7 8 19*    Recent Labs  Lab 05/13/21 1652 05/14/21 0416  PROT 5.3* 6.5  ALBUMIN 2.6* 3.2*  AST 23 46*  ALT 11 19  ALKPHOS 32* 40  BILITOT 1.0 1.2   Lipids No results for input(s): CHOL, TRIG, HDL, LABVLDL, LDLCALC, CHOLHDL in the last 168 hours.  Hematology Recent Labs  Lab 05/13/21 1652 05/14/21 0416  WBC 11.6* 10.4  RBC 6.35* 5.99*  HGB 17.4* 16.5  HCT 52.9* 50.5  MCV 83.3 84.3  MCH 27.4 27.5  MCHC 32.9 32.7  RDW 14.1 14.0  PLT 341 300   Thyroid No results  for input(s): TSH, FREET4 in the last 168 hours.  BNPNo results for input(s): BNP, PROBNP in the last 168 hours.  DDimer No results for input(s): DDIMER in the last 168 hours.   Radiology/Studies:  CT Head Wo Contrast  Result Date: 05/13/2021 CLINICAL DATA:  Syncope, cerebrovascular cause suspected EXAM: CT HEAD WITHOUT CONTRAST TECHNIQUE: Contiguous axial images were obtained from the base of the skull through the vertex without intravenous contrast. RADIATION DOSE REDUCTION: This exam was performed according to the departmental dose-optimization program which includes automated exposure control, adjustment of the mA and/or kV according to patient size and/or use of iterative reconstruction technique. COMPARISON:  05/05/2006 FINDINGS: Brain: No evidence of acute infarction, hemorrhage, cerebral edema, mass, mass effect, or midline shift. No hydrocephalus or extra-axial fluid collection. Vascular: No hyperdense vessel. Skull: Normal. Negative for fracture or focal lesion. Sinuses/Orbits: The orbits are unremarkable. Mucosal thickening in the left maxillary sinus, ethmoid air cells and right sphenoid sinus. Bubbly fluid in the right sphenoid sinus. Other: The mastoid air cells are well aerated. IMPRESSION: IMPRESSION 1.  No acute intracranial process. 2. Bubbly fluid in the right sphenoid sinus, which is nonspecific but can be seen in the setting of acute sinusitis. Correlate with symptoms. Electronically Signed   By: Wiliam Ke M.D.   On: 05/13/2021 17:38     Assessment and Plan:   Elevated troponin -overall flat, not consistent with ACS. No chest pain -echo pending -positive for cocaine, opiates on UDS -he denies any chest pain or shortness of breath, no prior history of heart issues -if echo unremarkable, no further cardiac testing needed at this time. If echo abnormal, would plan for medical management. I discussed this with him. He is hoping to go home today but is amenable to waiting for  the results of the echo.  Syncope -labs consistent with dehydration -borderline tachycardia/hypotension, also consistent -did have prodrome -would stop clonidine given symptoms -hypokalemia, hypocalcemia, hypomagnesemia being corrected per primary team -ECG on presentation with diffuse T wave  inversions. Once electrolyte abnormalities corrected, would repeat ECG  ADDENDUM: Read patient's echo, severely reduced LVEF and small LV thrombus. Discussed findings with patient. He is understandably upset and emotional about these findings. He denies using cocaine but does handle it. Discussed the importance of complete avoidance of cocaine. We also discussed options for evaluation and management.  After shared decision making, he wishes to proceed with cath on Monday. Will start IV heparin and carvedilol today.  Risk Assessment/Risk Scores:   N/A  For questions or updates, please contact [facility] Please consult [email or web address] for contact info under    Signed, Jodelle Red, MD  05/14/2021 8:50 AM

## 2021-05-14 NOTE — Progress Notes (Addendum)
The patient requests to take a shower. RN discussed safety precaution measures and risks associated with showering due to patients history of fall. Education provider to patient and partner.Provider contacted to assist and no order received at this time. Will continue to monitor patient. Bed alarm set.  ?

## 2021-05-14 NOTE — TOC Progression Note (Signed)
Transition of Care (TOC) - Progression Note  ? ? ?Patient Details  ?Name: ANTONIODEJESUS CANTLEY ?MRN: [id number] ?Date of Birth: 1980/07/27 ? ?Transition of Care (TOC) CM/SW Contact  ?Purcell Mouton, RN ?Phone Number: ?05/14/2021, 12:05 PM ? ?Clinical Narrative:    ? ? ?Transition of Care (TOC) Screening Note ? ? ?Patient Details  ?Name: MARNELL PARRILL ?Date of Birth: 11-29-1980 ? ? ?Transition of Care (TOC) CM/SW Contact:    ?Purcell Mouton, RN ?Phone Number: ?05/14/2021, 12:06 PM ? ? ? ?Transition of Care Department [facility]) has reviewed patient and no TOC needs have been identified at this time. We will continue to monitor patient advancement through interdisciplinary progression rounds. If new patient transition needs arise, please place a TOC consult. ?  ?Pharmacy will give pt a coupon for Eliquis. ?  ?  ? ?Expected Discharge Plan and Services ?  ?  ?  ?  ?  ?                ?  ?  ?  ?  ?  ?  ?  ?  ?  ?  ? ? ?Social Determinants of Health (SDOH) Interventions ?  ? ?Readmission Risk Interventions ?No flowsheet data found. ? ?

## 2021-05-14 NOTE — Progress Notes (Signed)
Pt c/o 10/10 pain in right jaw area. Pt states it feels like an abscess where tooth used to be. Back lower molar is missing with hole appearing decayed. RN administered PRN pain medication. On-call notified, no new orders given, will continue to monitor. ?

## 2021-05-14 NOTE — Progress Notes (Addendum)
PROGRESS NOTE  David [name] GBT:[id number] DOB: Dec 16, 1980 DOA: 05/13/2021 PCP: Grayce Sessions, NP  HPI/Recap of past 24 hours: David [name] is a 41 y.o. male with medical history significant of GI bleed, LA Grade C reflux esophagitis with no bleeding, acute gastritis, type 2 diabetes, polysubstance abuse, alcohol use disorder, hypertension, who presented after an episode of syncope and collapse, he hit his head on the ground.  Reports ongoing issues with lightheadedness after starting clonidine.    Upon presentation to the ED, work-up revealed elevated troponin, high-sensitivity troponin peaked at greater than 1300, electrolyte disturbances, UDS positive for cocaine and opiates.  Seen by cardiology, had a 2D echo which revealed severely reduced LVEF and small left ventricular thrombus.  Started on heparin drip and Coreg per cardiology.  Plan for heart cath on 05/15/2021.  05/14/2021: Patient was seen and examined at bedside.  His wife and other family members were present in the room.  He denies having any chest pain.  Denies palpitations or dyspnea.  States he felt like he was going to pass out prior to his syncopal episode.    Assessment/Plan: Principal Problem:   Hypokalemia Active Problems:   Type 2 diabetes mellitus without complication, with long-term current use of insulin (HCC)   Essential hypertension   AKI (acute kidney injury) (HCC)   Polysubstance abuse (HCC)   Hypomagnesemia   Hypocalcemia   Troponin level elevated  Elevated troponin, rule out ACS He denies any anginal symptoms, however the patient is diabetic.   High-sensitivity troponin peaked at greater than 1300 Abnormal ST-T changes. Serial EKG 2D echo showing severely reduced LVEF 25 to 30% with global hypokinesis, LV thrombus. Started on heparin drip, continue Plan for heart cath on 05/15/2021 Closely monitor on telemetry Appreciate cardiology's assistance  Newly diagnosed combined systolic and  diastolic CHF 2D echo done on 05/15/2018 showed LVEF 25 to 30%, grade 1 diastolic dysfunction, global hypokinesis, small LV thrombus. Coreg 3.125 mg twice daily started by cardiology. We will obtain fasting lipid panel in the morning and hemoglobin A1c Start strict I's and O's and daily weight Guideline directed medical therapy per cardiology.  Newly diagnosed left ventricular thrombus Started on heparin drip on 05/14/2021 Due to prior history of GI bleed will add IV PPI twice daily Monitor for any signs of bleeding.  Type 2 diabetes with hyperglycemia Hemoglobin A1c 6.7 on 11/17/2020 Repeat hemoglobin A1c Start insulin sliding scale and avoid hypoglycemia N.p.o. after midnight for planned heart cath  History of GI bleed, LA grade C reflux esophagitis, acute gastritis Started IV Protonix 40 mg twice daily while on heparin drip. Closely monitor H&H, stable at this time with hemoglobin of 16.5.  Recent leukocytosis, likely reactive in the setting of acute illness Nonseptic appearing, afebrile. No evidence of active infective process.  Cocaine use disorder TOC consulted to provide resources for polysubstance abuse cessation.  Alcohol use disorder No evidence of alcohol withdrawal at the time of this visit. CIWA protocol Multivitamin, thiamine and folic acid supplement.   Critical care time: 65 minutes.   Code Status: Full code  Family Communication: Wife at bedside  Disposition Plan: Likely will discharge to home once cardiology signs off.   Consultants: Cardiology  Procedures: 2D echo 05/14/2021  Antimicrobials: None  DVT prophylaxis: Heparin drip  Status is: Likely inpatient.  Patient requires at least 2 midnights for further evaluation and treatment of present condition due to concern for ACS, newly diagnosed combined systolic and diastolic CHF with severely  reduced LVEF, left ventricular thrombus requiring IV anticoagulation.    Objective: Vitals:    05/13/21 2141 05/14/21 0146 05/14/21 0528 05/14/21 0921  BP: 115/84 98/78 115/87 116/88  Pulse: 86 97 82 96  Resp: 18 16 18 16   Temp: 98.2 F (36.8 C) 98.4 F (36.9 C) (!) 97.5 F (36.4 C)   TempSrc: Oral Oral Oral   SpO2: 100% 98% 100% 100%  Weight: 69.2 kg     Height: 5\' 6"  (1.676 m)       Intake/Output Summary (Last 24 hours) at 05/14/2021 1250 Last data filed at 05/14/2021 0300 Gross per 24 hour  Intake 990.37 ml  Output 500 ml  Net 490.37 ml   Filed Weights   05/13/21 1638 05/13/21 2141  Weight: 65.8 kg 69.2 kg    Exam:  General: 41 y.o. year-old male well developed well nourished in no acute distress.  Alert and oriented x3. Cardiovascular: Regular rate and rhythm with no rubs or gallops.  No thyromegaly or JVD noted.   Respiratory: Clear to auscultation with no wheezes or rales. Good inspiratory effort. Abdomen: Soft nontender nondistended with normal bowel sounds x4 quadrants. Musculoskeletal: No lower extremity edema. 2/4 pulses in all 4 extremities. Skin: No ulcerative lesions noted or rashes, Psychiatry: Mood is appropriate for condition and setting Neuro: Moves all 4 extremities, nonfocal exam.   Data Reviewed: CBC: Recent Labs  Lab 05/13/21 1652 05/14/21 0416  WBC 11.6* 10.4  HGB 17.4* 16.5  HCT 52.9* 50.5  MCV 83.3 84.3  PLT 341 300   Basic Metabolic Panel: Recent Labs  Lab 05/13/21 1652 05/13/21 2358 05/14/21 0416  NA 137 134* 140  K 2.8* 3.7 4.4  CL 109 101 97*  CO2 21* 25 24  GLUCOSE 170* 145* 87  BUN 29* 34* 30*  CREATININE 1.62* 1.45* 1.21  CALCIUM 6.5* 8.6* 9.2  MG 1.4*  --  2.6*   GFR: Estimated Creatinine Clearance: 74.7 mL/min (by C-G formula based on SCr of 1.21 mg/dL). Liver Function Tests: Recent Labs  Lab 05/13/21 1652 05/14/21 0416  AST 23 46*  ALT 11 19  ALKPHOS 32* 40  BILITOT 1.0 1.2  PROT 5.3* 6.5  ALBUMIN 2.6* 3.2*   No results for input(s): LIPASE, AMYLASE in the last 168 hours. No results for input(s):  AMMONIA in the last 168 hours. Coagulation Profile: No results for input(s): INR, PROTIME in the last 168 hours. Cardiac Enzymes: No results for input(s): CKTOTAL, CKMB, CKMBINDEX, TROPONINI in the last 168 hours. BNP (last 3 results) No results for input(s): PROBNP in the last 8760 hours. HbA1C: No results for input(s): HGBA1C in the last 72 hours. CBG: Recent Labs  Lab 05/13/21 1646 05/13/21 2143 05/14/21 0740  GLUCAP 188* 103* 107*   Lipid Profile: No results for input(s): CHOL, HDL, LDLCALC, TRIG, CHOLHDL, LDLDIRECT in the last 72 hours. Thyroid Function Tests: No results for input(s): TSH, T4TOTAL, FREET4, T3FREE, THYROIDAB in the last 72 hours. Anemia Panel: No results for input(s): VITAMINB12, FOLATE, FERRITIN, TIBC, IRON, RETICCTPCT in the last 72 hours. Urine analysis:    Component Value Date/Time   COLORURINE YELLOW 05/14/2021 0131   APPEARANCEUR CLEAR 05/14/2021 0131   LABSPEC 1.017 05/14/2021 0131   PHURINE 5.0 05/14/2021 0131   GLUCOSEU 50 (A) 05/14/2021 0131   HGBUR NEGATIVE 05/14/2021 0131   BILIRUBINUR NEGATIVE 05/14/2021 0131   BILIRUBINUR negative 07/28/2015 1438   BILIRUBINUR Small 06/15/2014 1841   KETONESUR NEGATIVE 05/14/2021 0131   PROTEINUR 30 (A) 05/14/2021 0131  UROBILINOGEN 0.2 07/28/2015 1438   UROBILINOGEN 0.2 09/23/2014 1009   NITRITE NEGATIVE 05/14/2021 0131   LEUKOCYTESUR NEGATIVE 05/14/2021 0131   Sepsis Labs: (procalcitonin:4,lacticidven:4)  ) Recent Results (from the past 240 hour(s))  Resp Panel by RT-PCR (Flu A&B, Covid) Nasopharyngeal Swab     Status: None   Collection Time: 05/13/21  6:36 PM   Specimen: Nasopharyngeal Swab; Nasopharyngeal(NP) swabs in vial transport medium  Result Value Ref Range Status   SARS Coronavirus 2 by RT PCR NEGATIVE NEGATIVE Final    Comment: (NOTE) SARS-CoV-2 target nucleic acids are NOT DETECTED.  The SARS-CoV-2 RNA is generally detectable in upper respiratory specimens during the  acute phase of infection. The lowest concentration of SARS-CoV-2 viral copies this assay can detect is 138 copies/mL. A negative result does not preclude SARS-Cov-2 infection and should not be used as the sole basis for treatment or other patient management decisions. A negative result may occur with  improper specimen collection/handling, submission of specimen other than nasopharyngeal swab, presence of viral mutation(s) within the areas targeted by this assay, and inadequate number of viral copies(<138 copies/mL). A negative result must be combined with clinical observations, patient history, and epidemiological information. The expected result is Negative.  Fact Sheet for Patients:  BloggerCourse.com  Fact Sheet for Healthcare Providers:  SeriousBroker.it  This test is no t yet approved or cleared by the [place] FDA and  has been authorized for detection and/or diagnosis of SARS-CoV-2 by FDA under an Emergency Use Authorization (EUA). This EUA will remain  in effect (meaning this test can be used) for the duration of the COVID-19 declaration under Section 564(b)(1) of the Act, 21 U.S.C.section 360bbb-3(b)(1), unless the authorization is terminated  or revoked sooner.       Influenza A by PCR NEGATIVE NEGATIVE Final   Influenza B by PCR NEGATIVE NEGATIVE Final    Comment: (NOTE) The Xpert Xpress SARS-CoV-2/FLU/RSV plus assay is intended as an aid in the diagnosis of influenza from Nasopharyngeal swab specimens and should not be used as a sole basis for treatment. Nasal washings and aspirates are unacceptable for Xpert Xpress SARS-CoV-2/FLU/RSV testing.  Fact Sheet for Patients: BloggerCourse.com  Fact Sheet for Healthcare Providers: SeriousBroker.it  This test is not yet approved or cleared by the [place] FDA and has been authorized for detection and/or  diagnosis of SARS-CoV-2 by FDA under an Emergency Use Authorization (EUA). This EUA will remain in effect (meaning this test can be used) for the duration of the COVID-19 declaration under Section 564(b)(1) of the Act, 21 U.S.C. section 360bbb-3(b)(1), unless the authorization is terminated or revoked.  Performed at [facility], [address], [place], [place] [address]       Studies: CT Head Wo Contrast  Result Date: 05/13/2021 CLINICAL DATA:  Syncope, cerebrovascular cause suspected EXAM: CT HEAD WITHOUT CONTRAST TECHNIQUE: Contiguous axial images were obtained from the base of the skull through the vertex without intravenous contrast. RADIATION DOSE REDUCTION: This exam was performed according to the departmental dose-optimization program which includes automated exposure control, adjustment of the mA and/or kV according to patient size and/or use of iterative reconstruction technique. COMPARISON:  05/05/2006 FINDINGS: Brain: No evidence of acute infarction, hemorrhage, cerebral edema, mass, mass effect, or midline shift. No hydrocephalus or extra-axial fluid collection. Vascular: No hyperdense vessel. Skull: Normal. Negative for fracture or focal lesion. Sinuses/Orbits: The orbits are unremarkable. Mucosal thickening in the left maxillary sinus, ethmoid air cells and right sphenoid sinus. Bubbly fluid in  the right sphenoid sinus. Other: The mastoid air cells are well aerated. IMPRESSION: IMPRESSION 1.  No acute intracranial process. 2. Bubbly fluid in the right sphenoid sinus, which is nonspecific but can be seen in the setting of acute sinusitis. Correlate with symptoms. Electronically Signed   By: Wiliam Ke M.D.   On: 05/13/2021 17:38   ECHOCARDIOGRAM COMPLETE  Result Date: 05/14/2021    ECHOCARDIOGRAM REPORT   Patient Name:   David [name] Date of Exam: 05/14/2021 Medical Rec #:  [id number]        Height:       66.0 in Accession #:    [id number]       Weight:        152.6 lb Date of Birth:  Feb 13, 1981        BSA:          1.783 m Patient Age:    40 years         BP:           116/88 mmHg Patient Gender: M                HR:           77 bpm. Exam Location:  Inpatient Procedure: 2D Echo, 3D Echo, Cardiac Doppler, Color Doppler and Intracardiac            Opacification Agent Indications:    R55 Syncope  History:        Patient has prior history of Echocardiogram examinations, most                 recent 08/16/2020. Signs/Symptoms:Chest Pain; Risk                 Factors:Hypertension, Diabetes and Current Smoker. Polysubstance                 abuse. Elevated troponin. ETOH.  Sonographer:    Sheralyn Boatman RDCS Referring Phys: [id number] Cecille Po MELVIN IMPRESSIONS  1. Global hypokinesis, but mid to apical segments worse than base. 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 Small LV thrombus noted at apex, 0.98 x 0.75 cm. Left ventricular ejection fraction, by estimation, is 25 to 30%. The left ventricle has severely decreased function. The left ventricle demonstrates global hypokinesis. Left ventricular diastolic parameters are consistent with Grade I diastolic dysfunction (impaired relaxation).  2. Right ventricular systolic function is normal. The right ventricular size is normal. Tricuspid regurgitation signal is inadequate for assessing PA pressure.  3. The mitral valve is normal in structure. Trivial mitral valve regurgitation. No evidence of mitral stenosis.  4. The aortic valve is grossly normal. Aortic valve regurgitation is not visualized. No aortic stenosis is present.  5. The inferior vena cava is normal in size with greater than 50% respiratory variability, suggesting right atrial pressure of 3 mmHg. Comparison(s): No prior Echocardiogram. Conclusion(s)/Recommendation(s): Severely reduced LVEF, small LV thrombus. Findings discussed with patient. FINDINGS  Left Ventricle: Global hypokinesis, but mid to apical segments worse than base. Consider  takotsubo cardiomyopathy, though CAD cannot be excluded. Small LV thrombus noted at apex, 0.98 x 0.75 cm. Left ventricular ejection fraction, by estimation, is 25 to 30%. The left ventricle has severely decreased function. The left ventricle demonstrates global hypokinesis. Definity contrast agent was given IV to delineate the left ventricular endocardial borders. The left ventricular internal cavity size was normal in size. There is no left ventricular hypertrophy. Left ventricular diastolic parameters are consistent with Grade I diastolic dysfunction (impaired  relaxation). Right Ventricle: The right ventricular size is normal. No increase in right ventricular wall thickness. Right ventricular systolic function is normal. Tricuspid regurgitation signal is inadequate for assessing PA pressure. Left Atrium: Left atrial size was normal in size. Right Atrium: Right atrial size was normal in size. Pericardium: There is no evidence of pericardial effusion. Mitral Valve: The mitral valve is normal in structure. Trivial mitral valve regurgitation. No evidence of mitral valve stenosis. Tricuspid Valve: The tricuspid valve is normal in structure. Tricuspid valve regurgitation is not demonstrated. No evidence of tricuspid stenosis. Aortic Valve: The aortic valve is grossly normal. Aortic valve regurgitation is not visualized. No aortic stenosis is present. Pulmonic Valve: The pulmonic valve was not well visualized. Pulmonic valve regurgitation is not visualized. No evidence of pulmonic stenosis. Aorta: The aortic root, ascending aorta, aortic arch and descending aorta are all structurally normal, with no evidence of dilitation or obstruction. Venous: The inferior vena cava is normal in size with greater than 50% respiratory variability, suggesting right atrial pressure of 3 mmHg. IAS/Shunts: The atrial septum is grossly normal.  LEFT VENTRICLE PLAX 2D LVIDd:         5.20 cm      Diastology LVIDs:         4.60 cm      LV e'  medial:    4.13 cm/s LV PW:         1.00 cm      LV E/e' medial:  18.0 LV IVS:        1.00 cm      LV e' lateral:   3.90 cm/s LVOT diam:     2.20 cm      LV E/e' lateral: 19.1 LV SV:         61 LV SV Index:   34 LVOT Area:     3.80 cm                              3D Volume EF: LV Volumes (MOD)            3D EF:        25 % LV vol d, MOD A2C: 94.0 ml  LV EDV:       166 ml LV vol d, MOD A4C: 116.6 ml LV ESV:       125 ml LV vol s, MOD A2C: 69.2 ml  LV SV:        41 ml LV vol s, MOD A4C: 70.2 ml LV SV MOD A2C:     24.8 ml LV SV MOD A4C:     116.6 ml LV SV MOD BP:      36.2 ml RIGHT VENTRICLE             IVC RV S prime:     13.20 cm/s  IVC diam: 1.40 cm TAPSE (M-mode): 1.1 cm LEFT ATRIUM           Index        RIGHT ATRIUM           Index LA diam:      2.90 cm 1.63 cm/m   RA Area:     13.30 cm LA Vol (A2C): 24.9 ml 13.97 ml/m  RA Volume:   33.50 ml  18.79 ml/m LA Vol (A4C): 26.8 ml 15.03 ml/m  AORTIC VALVE LVOT Vmax:   98.10 cm/s LVOT Vmean:  61.700 cm/s LVOT VTI:    0.160  m  AORTA Ao Root diam: 3.20 cm Ao Asc diam:  3.30 cm MITRAL VALVE MV Area (PHT): 3.81 cm    SHUNTS MV Decel Time: 199 msec    Systemic VTI:  0.16 m MV E velocity: 74.30 cm/s  Systemic Diam: 2.20 cm MV A velocity: 97.90 cm/s MV E/A ratio:  0.76 Jodelle Red MD Electronically signed by Jodelle Red MD Signature Date/Time: 05/14/2021/12:18:54 PM    Final     Scheduled Meds:  carvedilol  3.125 mg Oral BID WC   heparin  2,000 Units Intravenous Once   insulin aspart  0-9 Units Subcutaneous TID WC   sodium chloride flush  3 mL Intravenous Q12H    Continuous Infusions:  heparin       LOS: 0 days     Darlin Drop, MD [facility] Pager [phone]  If 7PM-7AM, please contact night-coverage [email or web address] Password [facility] 05/14/2021, 12:50 PM

## 2021-05-15 DIAGNOSIS — I429 Cardiomyopathy, unspecified: Secondary | ICD-10-CM

## 2021-05-15 DIAGNOSIS — I5189 Other ill-defined heart diseases: Secondary | ICD-10-CM

## 2021-05-15 DIAGNOSIS — I513 Intracardiac thrombosis, not elsewhere classified: Principal | ICD-10-CM

## 2021-05-15 LAB — CBC
HCT: 41 % (ref 39.0–52.0)
Hemoglobin: 13.3 g/dL (ref 13.0–17.0)
MCH: 27.2 pg (ref 26.0–34.0)
MCHC: 32.4 g/dL (ref 30.0–36.0)
MCV: 83.8 fL (ref 80.0–100.0)
Platelets: 264 K/uL (ref 150–400)
RBC: 4.89 MIL/uL (ref 4.22–5.81)
RDW: 13.9 % (ref 11.5–15.5)
WBC: 8.3 K/uL (ref 4.0–10.5)
nRBC: 0 % (ref 0.0–0.2)

## 2021-05-15 LAB — GLUCOSE, CAPILLARY
Glucose-Capillary: 140 mg/dL — ABNORMAL HIGH (ref 70–99)
Glucose-Capillary: 169 mg/dL — ABNORMAL HIGH (ref 70–99)
Glucose-Capillary: 164 mg/dL — ABNORMAL HIGH (ref 70–99)
Glucose-Capillary: 110 mg/dL — ABNORMAL HIGH (ref 70–99)

## 2021-05-15 LAB — LIPID PANEL
Cholesterol: 116 mg/dL (ref 0–200)
HDL: 46 mg/dL
LDL Cholesterol: 52 mg/dL (ref 0–99)
Total CHOL/HDL Ratio: 2.5 ratio
Triglycerides: 90 mg/dL
VLDL: 18 mg/dL (ref 0–40)

## 2021-05-15 LAB — COMPREHENSIVE METABOLIC PANEL WITH GFR
ALT: 14 U/L (ref 0–44)
AST: 18 U/L (ref 15–41)
Albumin: 3.1 g/dL — ABNORMAL LOW (ref 3.5–5.0)
Alkaline Phosphatase: 35 U/L — ABNORMAL LOW (ref 38–126)
Anion gap: 6 (ref 5–15)
BUN: 21 mg/dL — ABNORMAL HIGH (ref 6–20)
CO2: 24 mmol/L (ref 22–32)
Calcium: 8.4 mg/dL — ABNORMAL LOW (ref 8.9–10.3)
Chloride: 105 mmol/L (ref 98–111)
Creatinine, Ser: 0.98 mg/dL (ref 0.61–1.24)
GFR, Estimated: >60 mL/min
Glucose, Bld: 115 mg/dL — ABNORMAL HIGH (ref 70–99)
Potassium: 3.8 mmol/L (ref 3.5–5.1)
Sodium: 135 mmol/L (ref 135–145)
Total Bilirubin: 0.4 mg/dL (ref 0.3–1.2)
Total Protein: 6.2 g/dL — ABNORMAL LOW (ref 6.5–8.1)

## 2021-05-15 LAB — HEPARIN LEVEL (UNFRACTIONATED): Heparin Unfractionated: 0.53 IU/mL (ref 0.30–0.70)

## 2021-05-15 LAB — PHOSPHORUS: Phosphorus: 3.2 mg/dL (ref 2.5–4.6)

## 2021-05-15 LAB — MAGNESIUM: Magnesium: 2.1 mg/dL (ref 1.7–2.4)

## 2021-05-15 MED ORDER — ASPIRIN 81 MG PO CHEW
81.0000 mg | CHEWABLE_TABLET | ORAL | Status: AC
  Administered 2021-05-16: 81 mg via ORAL
  Filled 2021-05-15: qty 1

## 2021-05-15 MED ORDER — SODIUM CHLORIDE 0.9% FLUSH: 3.0000 mL | INTRAVENOUS | Status: DC | PRN

## 2021-05-15 MED ORDER — SODIUM CHLORIDE 0.9 % IV SOLN
INTRAVENOUS | Status: DC
Start: 2021-05-16 — End: 2021-05-16

## 2021-05-15 MED ORDER — SODIUM CHLORIDE 0.9 % IV SOLN: 250.0000 mL | INTRAVENOUS | Status: DC | PRN

## 2021-05-15 MED ORDER — SODIUM CHLORIDE 0.9% FLUSH
3.0000 mL | Freq: Two times a day (BID) | INTRAVENOUS | Status: DC
  Administered 2021-05-15 – 2021-05-16 (×2): 3 mL via INTRAVENOUS

## 2021-05-15 NOTE — Progress Notes (Signed)
Notified by CCMD with concerns  that patients T wave depression had worsened.  This RN notified cardiology on call, no new orders.  Patient scheduled for cardiac cath 05/16/2021.  Will continue to monitor.   ?

## 2021-05-15 NOTE — Progress Notes (Signed)
ANTICOAGULATION CONSULT NOTE - follow up ? ?Pharmacy Consult for heparin ?Indication:  LV Thrombus ? ?No Known Allergies ? ?Patient Measurements: ?Height: 5\' 6"  (167.6 cm) ?Weight: 69.2 kg (152 lb 9.6 oz) ?IBW/kg (Calculated) : 63.8 ?Heparin Dosing Weight: n/a. Use TBW = 69 kg ? ?Vital Signs: ?Temp: 97.6 ?F (36.4 ?C) (03/11 1955) ?Temp Source: Oral (03/11 1955) ?BP: 138/104 (03/11 1955) ?Pulse Rate: 80 (03/11 1955) ? ?Labs: ?Recent Labs  ?  05/13/21 ?1652 05/13/21 ?1900 05/13/21 ?2202 05/13/21 ?2358 05/14/21 ?0416 05/14/21 ?1918 05/15/21 ?0148  ?HGB 17.4*  --   --   --  16.5  --  13.3  ?HCT 52.9*  --   --   --  50.5  --  41.0  ?PLT 341  --   --   --  300  --  264  ?HEPARINUNFRC  --   --   --   --   --  0.33 0.53  ?CREATININE 1.62*  --   --  1.45* 1.21  --  0.98  ?TROPONINIHS 1,237* [id number]* 1,085* 1,110*  --   --   --   ? ? ? ?Estimated Creatinine Clearance: 92.2 mL/min (by C-G formula based on SCr of 0.98 mg/dL). ? ? ?Medical History: ?Past Medical History:  ?Diagnosis Date  ? Diabetes mellitus ([place])   ? Hypertension   ? Metacarpal bone fracture 07/11/2015  ? right small  ? Neuropathy   ? 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 ? ?Medications: No anticoagulants PTA ? ?Assessment: ?Patient is a 3 yoM with PMH of T2DM, GIB, polysubstance abuse. Presented after syncopal episode, pharmacy consulted to dose heparin drip by cardiology for LV thrombus. ? ?Baseline labs: ?-Hgb 16.5 ?-Plt 300 ? ?05/15/21, Today ?HL 0.53 therapeutic on 1300 units/hr ?CBC WNL ?Per RN no interruptions or bleeding ? ?Goal of Therapy:  ?Heparin level 0.3-0.7 units/ml ?Monitor platelets by anticoagulation protocol: Yes ?  ?Plan:  ?Continue heparin drip at 1300 units/hr ?Daily heparin level ?Daily CBC ? ?Dolly Rias RPh ?05/15/2021, 3:54 AM ? ?

## 2021-05-15 NOTE — Progress Notes (Signed)
Progress Note  Patient Name: David [name] Date of Encounter: 05/15/2021  [facility] Cardiologist: Jodelle Red, MD   Subjective   No acute events overnight. Tolerating carvedilol. All questions answered this AM.  Inpatient Medications    Scheduled Meds:  carvedilol  3.125 mg Oral BID WC   Chlorhexidine Gluconate Cloth  6 each Topical Q0600   folic acid  1 mg Oral Daily   insulin aspart  0-5 Units Subcutaneous QHS   insulin aspart  0-9 Units Subcutaneous TID WC   multivitamin with minerals  1 tablet Oral Daily   mupirocin ointment  1 application. Nasal BID   pantoprazole (PROTONIX) IV  40 mg Intravenous BID   sodium chloride flush  3 mL Intravenous Q12H   thiamine  100 mg Oral Daily   Or   thiamine  100 mg Intravenous Daily   Continuous Infusions:  heparin 1,300 Units/hr (05/15/21 0722)   PRN Meds: acetaminophen **OR** acetaminophen, hydrALAZINE, LORazepam **OR** LORazepam, polyethylene glycol   Vital Signs    Vitals:   05/14/21 1801 05/14/21 1955 05/15/21 0432 05/15/21 0500  BP: (!) 140/100 (!) 138/104 (!) 148/103   Pulse: 80 80 77   Resp: 16 16 16    Temp: 98 F (36.7 C) 97.6 F (36.4 C) 97.9 F (36.6 C)   TempSrc: Oral Oral Oral   SpO2: 100% 100% 100%   Weight:    69.3 kg  Height:        Intake/Output Summary (Last 24 hours) at 05/15/2021 1035 Last data filed at 05/15/2021 0600 Gross per 24 hour  Intake 861.86 ml  Output 350 ml  Net 511.86 ml   Last 3 Weights 05/15/2021 05/13/2021 05/13/2021  Weight (lbs) 152 lb 12.5 oz 152 lb 9.6 oz 145 lb  Weight (kg) 69.3 kg 69.219 kg 65.772 kg      Telemetry    SR - Personally Reviewed  ECG    Repeat ecg 05/14/21 with persistent ST/T changes - Personally Reviewed  Physical Exam   GEN: No acute distress.   Neck: No JVD Cardiac: RRR, no murmurs, rubs, or gallops.  Respiratory: Clear to auscultation bilaterally. GI: Soft, nontender, non-distended  MS: No edema; No deformity. Neuro:   Nonfocal  Psych: Normal affect   Labs    High Sensitivity Troponin:   Recent Labs  Lab 05/13/21 1652 05/13/21 1900 05/13/21 2202 05/13/21 2358  TROPONINIHS 1,237* 1,397* 1,085* 1,110*     Chemistry Recent Labs  Lab 05/13/21 1652 05/13/21 2358 05/14/21 0416 05/15/21 0148  NA 137 134* 140 135  K 2.8* 3.7 4.4 3.8  CL 109 101 97* 105  CO2 21* 25 24 24   GLUCOSE 170* 145* 87 115*  BUN 29* 34* 30* 21*  CREATININE 1.62* 1.45* 1.21 0.98  CALCIUM 6.5* 8.6* 9.2 8.4*  MG 1.4*  --  2.6* 2.1  PROT 5.3*  --  6.5 6.2*  ALBUMIN 2.6*  --  3.2* 3.1*  AST 23  --  46* 18  ALT 11  --  19 14  ALKPHOS 32*  --  40 35*  BILITOT 1.0  --  1.2 0.4  GFRNONAA 55* >60 >60 >60  ANIONGAP 7 8 19* 6    Lipids  Recent Labs  Lab 05/15/21 0148  CHOL 116  TRIG 90  HDL 46  LDLCALC 52  CHOLHDL 2.5    Hematology Recent Labs  Lab 05/13/21 1652 05/14/21 0416 05/15/21 0148  WBC 11.6* 10.4 8.3  RBC 6.35* 5.99* 4.89  HGB  17.4* 16.5 13.3  HCT 52.9* 50.5 41.0  MCV 83.3 84.3 83.8  MCH 27.4 27.5 27.2  MCHC 32.9 32.7 32.4  RDW 14.1 14.0 13.9  PLT 341 300 264   Thyroid No results for input(s): TSH, FREET4 in the last 168 hours.  BNPNo results for input(s): BNP, PROBNP in the last 168 hours.  DDimer No results for input(s): DDIMER in the last 168 hours.   Radiology    CT Head Wo Contrast  Result Date: 05/13/2021 CLINICAL DATA:  Syncope, cerebrovascular cause suspected EXAM: CT HEAD WITHOUT CONTRAST TECHNIQUE: Contiguous axial images were obtained from the base of the skull through the vertex without intravenous contrast. RADIATION DOSE REDUCTION: This exam was performed according to the departmental dose-optimization program which includes automated exposure control, adjustment of the mA and/or kV according to patient size and/or use of iterative reconstruction technique. COMPARISON:  05/05/2006 FINDINGS: Brain: No evidence of acute infarction, hemorrhage, cerebral edema, mass, mass effect, or  midline shift. No hydrocephalus or extra-axial fluid collection. Vascular: No hyperdense vessel. Skull: Normal. Negative for fracture or focal lesion. Sinuses/Orbits: The orbits are unremarkable. Mucosal thickening in the left maxillary sinus, ethmoid air cells and right sphenoid sinus. Bubbly fluid in the right sphenoid sinus. Other: The mastoid air cells are well aerated. IMPRESSION: IMPRESSION 1.  No acute intracranial process. 2. Bubbly fluid in the right sphenoid sinus, which is nonspecific but can be seen in the setting of acute sinusitis. Correlate with symptoms. Electronically Signed   By: Wiliam Ke M.D.   On: 05/13/2021 17:38   ECHOCARDIOGRAM COMPLETE  Result Date: 05/14/2021    ECHOCARDIOGRAM REPORT   Patient Name:   David [name] Date of Exam: 05/14/2021 Medical Rec #:  [id number]        Height:       66.0 in Accession #:    [id number]       Weight:       152.6 lb Date of Birth:  23-Feb-1981        BSA:          1.783 m Patient Age:    41 years         BP:           116/88 mmHg Patient Gender: M                HR:           77 bpm. Exam Location:  Inpatient Procedure: 2D Echo, 3D Echo, Cardiac Doppler, Color Doppler and Intracardiac            Opacification Agent Indications:    R55 Syncope  History:        Patient has prior history of Echocardiogram examinations, most                 recent 08/16/2020. Signs/Symptoms:Chest Pain; Risk                 Factors:Hypertension, Diabetes and Current Smoker. Polysubstance                 abuse. Elevated troponin. ETOH.  Sonographer:    Sheralyn Boatman RDCS Referring Phys: [id number] Cecille Po MELVIN IMPRESSIONS  1. Global hypokinesis, but mid to apical segments worse than base. 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 Small LV thrombus noted at apex, 0.98 x 0.75 cm. Left ventricular ejection fraction, by estimation, is 25 to 30%. The left ventricle has severely decreased function. The left ventricle demonstrates global hypokinesis.  Left  ventricular diastolic parameters are consistent with Grade I diastolic dysfunction (impaired relaxation).  2. Right ventricular systolic function is normal. The right ventricular size is normal. Tricuspid regurgitation signal is inadequate for assessing PA pressure.  3. The mitral valve is normal in structure. Trivial mitral valve regurgitation. No evidence of mitral stenosis.  4. The aortic valve is grossly normal. Aortic valve regurgitation is not visualized. No aortic stenosis is present.  5. The inferior vena cava is normal in size with greater than 50% respiratory variability, suggesting right atrial pressure of 3 mmHg. Comparison(s): No prior Echocardiogram. Conclusion(s)/Recommendation(s): Severely reduced LVEF, small LV thrombus. Findings discussed with patient. FINDINGS  Left Ventricle: Global hypokinesis, but mid to apical segments worse than base. 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 Small LV thrombus noted at apex, 0.98 x 0.75 cm. Left ventricular ejection fraction, by estimation, is 25 to 30%. The left ventricle has severely decreased function. The left ventricle demonstrates global hypokinesis. Definity contrast agent was given IV to delineate the left ventricular endocardial borders. The left ventricular internal cavity size was normal in size. There is no left ventricular hypertrophy. Left ventricular diastolic parameters are consistent with Grade I diastolic dysfunction (impaired relaxation). Right Ventricle: The right ventricular size is normal. No increase in right ventricular wall thickness. Right ventricular systolic function is normal. Tricuspid regurgitation signal is inadequate for assessing PA pressure. Left Atrium: Left atrial size was normal in size. Right Atrium: Right atrial size was normal in size. Pericardium: There is no evidence of pericardial effusion. Mitral Valve: The mitral valve is normal in structure. Trivial mitral valve regurgitation. No evidence  of mitral valve stenosis. Tricuspid Valve: The tricuspid valve is normal in structure. Tricuspid valve regurgitation is not demonstrated. No evidence of tricuspid stenosis. Aortic Valve: The aortic valve is grossly normal. Aortic valve regurgitation is not visualized. No aortic stenosis is present. Pulmonic Valve: The pulmonic valve was not well visualized. Pulmonic valve regurgitation is not visualized. No evidence of pulmonic stenosis. Aorta: The aortic root, ascending aorta, aortic arch and descending aorta are all structurally normal, with no evidence of dilitation or obstruction. Venous: The inferior vena cava is normal in size with greater than 50% respiratory variability, suggesting right atrial pressure of 3 mmHg. IAS/Shunts: The atrial septum is grossly normal.  LEFT VENTRICLE PLAX 2D LVIDd:         5.20 cm      Diastology LVIDs:         4.60 cm      LV e' medial:    4.13 cm/s LV PW:         1.00 cm      LV E/e' medial:  18.0 LV IVS:        1.00 cm      LV e' lateral:   3.90 cm/s LVOT diam:     2.20 cm      LV E/e' lateral: 19.1 LV SV:         61 LV SV Index:   34 LVOT Area:     3.80 cm                              3D Volume EF: LV Volumes (MOD)            3D EF:        25 % LV vol d, MOD A2C: 94.0 ml  LV EDV:  166 ml LV vol d, MOD A4C: 116.6 ml LV ESV:       125 ml LV vol s, MOD A2C: 69.2 ml  LV SV:        41 ml LV vol s, MOD A4C: 70.2 ml LV SV MOD A2C:     24.8 ml LV SV MOD A4C:     116.6 ml LV SV MOD BP:      36.2 ml RIGHT VENTRICLE             IVC RV S prime:     13.20 cm/s  IVC diam: 1.40 cm TAPSE (M-mode): 1.1 cm LEFT ATRIUM           Index        RIGHT ATRIUM           Index LA diam:      2.90 cm 1.63 cm/m   RA Area:     13.30 cm LA Vol (A2C): 24.9 ml 13.97 ml/m  RA Volume:   33.50 ml  18.79 ml/m LA Vol (A4C): 26.8 ml 15.03 ml/m  AORTIC VALVE LVOT Vmax:   98.10 cm/s LVOT Vmean:  61.700 cm/s LVOT VTI:    0.160 m  AORTA Ao Root diam: 3.20 cm Ao Asc diam:  3.30 cm MITRAL VALVE MV Area (PHT):  3.81 cm    SHUNTS MV Decel Time: 199 msec    Systemic VTI:  0.16 m MV E velocity: 74.30 cm/s  Systemic Diam: 2.20 cm MV A velocity: 97.90 cm/s MV E/A ratio:  0.76 Jodelle Red MD Electronically signed by Jodelle Red MD Signature Date/Time: 05/14/2021/12:18:54 PM    Final     Cardiac Studies   Echo 05/14/21 1. Global hypokinesis, but mid to apical segments worse than base.  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 Small LV  thrombus noted at apex, 0.98 x 0.75 cm. Left ventricular ejection  fraction, by estimation, is 25 to 30%. The  left ventricle has severely decreased function. The left ventricle  demonstrates global hypokinesis. Left ventricular diastolic parameters are  consistent with Grade I diastolic dysfunction (impaired relaxation).   2. Right ventricular systolic function is normal. The right ventricular  size is normal. Tricuspid regurgitation signal is inadequate for assessing  PA pressure.   3. The mitral valve is normal in structure. Trivial mitral valve  regurgitation. No evidence of mitral stenosis.   4. The aortic valve is grossly normal. Aortic valve regurgitation is not  visualized. No aortic stenosis is present.   5. The inferior vena cava is normal in size with greater than 50%  respiratory variability, suggesting right atrial pressure of 3 mmHg.   Patient Profile     41 y.o. male with a hx of substance abuse, GI bleed/gastritis, type II diabetes on insulin who is being seen for the evaluation of elevated troponins at the request of Dr. Margo Aye. Found to have new cardiomyopathy and LV thrombus.  Assessment & Plan    Elevated troponin New cardiomyopathy Reduced LV function without clinical heart failure -lipids excellent on no agents, LDL 52, TG 90 -pattern on echo likely nonischemic/takostubo -discussed cath to exclude CAD and evaluate hemodynamics Risks and benefits of cardiac catheterization have been discussed with the  patient.  These include bleeding, infection, kidney damage, stroke, heart attack, death.  The patient understands these risks and is willing to proceed.  -tolerating carvedilol -post cath, would start ARB or ARNI based on blood pressure and kidney function  Acute kidney injury  Hypokalemia Hypomagnesemia Hypocalcemia -Cr on presentation 1.62, now 0.98 -electrolyte abnormalities improved  LV thrombus -started heparin, change to oral anticoagulation after cath -while coumadin is recommended, management of this post discharge may be difficult. Patient's partner not in the room today, but would discuss coumadin vs DOAC based on options for outpatient management  Syncope -no arrhythmias on telemetry, given clinical history and electrolyte abnormalities suspect 2/2 dehydration  Substance use/exposure -cocaine and opiate positive on admission UDS. Denies use but does come into contact with these substances. Stressed importance of complete avoidance  For questions or updates, please contact [facility] Please consult [email or web address] for contact info under     Signed, Jodelle Red, MD  05/15/2021, 10:35 AM

## 2021-05-15 NOTE — Progress Notes (Signed)
PROGRESS NOTE  David [name] ZOX:[id number] DOB: 1980-08-05 DOA: 05/13/2021 PCP: Grayce Sessions, NP  HPI/Recap of past 24 hours: David [name] is a 41 y.o. male with medical history significant of GI bleed, LA Grade C reflux esophagitis with no bleeding, acute gastritis, type 2 diabetes, polysubstance abuse, alcohol use disorder, hypertension, who presented after an episode of syncope and collapse, he hit his head on the ground.  Reports ongoing issues with lightheadedness after starting clonidine.    Upon presentation to the ED, work-up revealed elevated troponin, high-sensitivity troponin peaked at greater than 1300, electrolyte disturbances, UDS positive for cocaine and opiates.  Seen by cardiology, had a 2D echo which revealed severely reduced LVEF and small left ventricular thrombus.  Started on heparin drip and Coreg per cardiology.  Plan for heart cath on 05/16/2021.  05/15/2021: Patient was seen at his bedside.  There were no acute events overnight.  He has no new complaints.  Denies any anginal symptoms.  Seen by cardiology.  Plan heart cath on 05/16/2021.    Assessment/Plan: Principal Problem:   Hypokalemia Active Problems:   Type 2 diabetes mellitus without complication, with long-term current use of insulin (HCC)   Essential hypertension   AKI (acute kidney injury) (HCC)   Polysubstance abuse (HCC)   Hypomagnesemia   Hypocalcemia   Troponin level elevated  Elevated troponin, rule out ACS He denies any anginal symptoms, however the patient is diabetic.   High-sensitivity troponin peaked at greater than 1300 Abnormal ST-T changes. Serial EKG 2D echo showing severely reduced LVEF 25 to 30% with global hypokinesis, LV thrombus. Continue heparin drip, continue Plan for heart cath on 05/16/2021 Closely monitor on telemetry Appreciate cardiology's assistance  Newly diagnosed combined systolic and diastolic CHF 2D echo done on 05/15/2018 showed LVEF 25 to 30%, grade 1  diastolic dysfunction, global hypokinesis, small LV thrombus. Coreg 3.125 mg twice daily started by cardiology. We will obtain fasting lipid panel in the morning and hemoglobin A1c Start strict I's and O's and daily weight Guideline directed medical therapy per cardiology.  Newly diagnosed left ventricular thrombus Started on heparin drip on 05/14/2021 Due to prior history of GI bleed will add IV PPI twice daily Monitor for any signs of bleeding.  Type 2 diabetes with hyperglycemia Hemoglobin A1c 6.7 on 11/17/2020 Repeat hemoglobin A1c Start insulin sliding scale and avoid hypoglycemia N.p.o. after midnight for planned heart cath  History of GI bleed, LA grade C reflux esophagitis, acute gastritis Continue IV Protonix 40 mg twice daily while on heparin drip. Closely monitor H&H, stable at this time with hemoglobin of 16.5.  Recent leukocytosis, likely reactive in the setting of acute illness Nonseptic appearing, afebrile. No evidence of active infective process.  Cocaine use disorder TOC consulted to provide resources for polysubstance abuse cessation.  Alcohol use disorder No evidence of alcohol withdrawal at the time of this visit. CIWA protocol Multivitamin, thiamine and folic acid supplement.      Code Status: Full code  Family Communication: Wife at bedside  Disposition Plan: Likely will discharge to home once cardiology signs off.   Consultants: Cardiology  Procedures: 2D echo 05/14/2021  Antimicrobials: None  DVT prophylaxis: Heparin drip  Status is: Likely inpatient.  Patient requires at least 2 midnights for further evaluation and treatment of present condition due to concern for ACS, newly diagnosed combined systolic and diastolic CHF with severely reduced LVEF, left ventricular thrombus requiring IV anticoagulation.    Objective: Vitals:   05/14/21 1801 05/14/21  1955 05/15/21 0432 05/15/21 0500  BP: (!) 140/100 (!) 138/104 (!) 148/103   Pulse:  80 80 77   Resp: 16 16 16    Temp: 98 F (36.7 C) 97.6 F (36.4 C) 97.9 F (36.6 C)   TempSrc: Oral Oral Oral   SpO2: 100% 100% 100%   Weight:    69.3 kg  Height:        Intake/Output Summary (Last 24 hours) at 05/15/2021 1223 Last data filed at 05/15/2021 1036 Gross per 24 hour  Intake 861.86 ml  Output 950 ml  Net -88.14 ml   Filed Weights   05/13/21 1638 05/13/21 2141 05/15/21 0500  Weight: 65.8 kg 69.2 kg 69.3 kg    Exam:  General: 41 y.o. year-old male well-developed well-nourished in no acute distress.  Alert and oriented x3.   Cardiovascular: Regular rate and rhythm no rubs or gallops.   Respiratory: Clear to auscultation no wheezes or rales.   Abdomen: Soft noted normal bowel sounds. Musculoskeletal: No lower extremity edema bilaterally.   Skin: No ulcerative lesions noted. Psychiatry: Mood is appropriate for condition and setting. Neuro: Moves all 4 extremities.   Data Reviewed: CBC: Recent Labs  Lab 05/13/21 1652 05/14/21 0416 05/15/21 0148  WBC 11.6* 10.4 8.3  HGB 17.4* 16.5 13.3  HCT 52.9* 50.5 41.0  MCV 83.3 84.3 83.8  PLT 341 300 264   Basic Metabolic Panel: Recent Labs  Lab 05/13/21 1652 05/13/21 2358 05/14/21 0416 05/15/21 0148  NA 137 134* 140 135  K 2.8* 3.7 4.4 3.8  CL 109 101 97* 105  CO2 21* 25 24 24   GLUCOSE 170* 145* 87 115*  BUN 29* 34* 30* 21*  CREATININE 1.62* 1.45* 1.21 0.98  CALCIUM 6.5* 8.6* 9.2 8.4*  MG 1.4*  --  2.6* 2.1  PHOS  --   --   --  3.2   GFR: Estimated Creatinine Clearance: 92.2 mL/min (by C-G formula based on SCr of 0.98 mg/dL). Liver Function Tests: Recent Labs  Lab 05/13/21 1652 05/14/21 0416 05/15/21 0148  AST 23 46* 18  ALT 11 19 14   ALKPHOS 32* 40 35*  BILITOT 1.0 1.2 0.4  PROT 5.3* 6.5 6.2*  ALBUMIN 2.6* 3.2* 3.1*   No results for input(s): LIPASE, AMYLASE in the last 168 hours. No results for input(s): AMMONIA in the last 168 hours. Coagulation Profile: No results for input(s): INR,  PROTIME in the last 168 hours. Cardiac Enzymes: No results for input(s): CKTOTAL, CKMB, CKMBINDEX, TROPONINI in the last 168 hours. BNP (last 3 results) No results for input(s): PROBNP in the last 8760 hours. HbA1C: No results for input(s): HGBA1C in the last 72 hours. CBG: Recent Labs  Lab 05/14/21 1456 05/14/21 1749 05/14/21 1950 05/15/21 0725 05/15/21 1200  GLUCAP 104* 111* 120* 140* 169*   Lipid Profile: Recent Labs    05/15/21 0148  CHOL 116  HDL 46  LDLCALC 52  TRIG 90  CHOLHDL 2.5   Thyroid Function Tests: No results for input(s): TSH, T4TOTAL, FREET4, T3FREE, THYROIDAB in the last 72 hours. Anemia Panel: No results for input(s): VITAMINB12, FOLATE, FERRITIN, TIBC, IRON, RETICCTPCT in the last 72 hours. Urine analysis:    Component Value Date/Time   COLORURINE YELLOW 05/14/2021 0131   APPEARANCEUR CLEAR 05/14/2021 0131   LABSPEC 1.017 05/14/2021 0131   PHURINE 5.0 05/14/2021 0131   GLUCOSEU 50 (A) 05/14/2021 0131   HGBUR NEGATIVE 05/14/2021 0131   BILIRUBINUR NEGATIVE 05/14/2021 0131   BILIRUBINUR negative 07/28/2015 1438  BILIRUBINUR Small 06/15/2014 1841   KETONESUR NEGATIVE 05/14/2021 0131   PROTEINUR 30 (A) 05/14/2021 0131   UROBILINOGEN 0.2 07/28/2015 1438   UROBILINOGEN 0.2 09/23/2014 1009   NITRITE NEGATIVE 05/14/2021 0131   LEUKOCYTESUR NEGATIVE 05/14/2021 0131   Sepsis Labs: @LABRCNTIP (procalcitonin:4,lacticidven:4)  ) Recent Results (from the past 240 hour(s))  Resp Panel by RT-PCR (Flu A&B, Covid) Nasopharyngeal Swab     Status: None   Collection Time: 05/13/21  6:36 PM   Specimen: Nasopharyngeal Swab; Nasopharyngeal(NP) swabs in vial transport medium  Result Value Ref Range Status   SARS Coronavirus 2 by RT PCR NEGATIVE NEGATIVE Final    Comment: (NOTE) SARS-CoV-2 target nucleic acids are NOT DETECTED.  The SARS-CoV-2 RNA is generally detectable in upper respiratory specimens during the acute phase of infection. The  lowest concentration of SARS-CoV-2 viral copies this assay can detect is 138 copies/mL. A negative result does not preclude SARS-Cov-2 infection and should not be used as the sole basis for treatment or other patient management decisions. A negative result may occur with  improper specimen collection/handling, submission of specimen other than nasopharyngeal swab, presence of viral mutation(s) within the areas targeted by this assay, and inadequate number of viral copies(<138 copies/mL). A negative result must be combined with clinical observations, patient history, and epidemiological information. The expected result is Negative.  Fact Sheet for Patients:  07/13/21  Fact Sheet for Healthcare Providers:  BloggerCourse.com  This test is no t yet approved or cleared by the [employer] FDA and  has been authorized for detection and/or diagnosis of SARS-CoV-2 by FDA under an Emergency Use Authorization (EUA). This EUA will remain  in effect (meaning this test can be used) for the duration of the COVID-19 declaration under Section 564(b)(1) of the Act, 21 U.S.C.section 360bbb-3(b)(1), unless the authorization is terminated  or revoked sooner.       Influenza A by PCR NEGATIVE NEGATIVE Final   Influenza B by PCR NEGATIVE NEGATIVE Final    Comment: (NOTE) The Xpert Xpress SARS-CoV-2/FLU/RSV plus assay is intended as an aid in the diagnosis of influenza from Nasopharyngeal swab specimens and should not be used as a sole basis for treatment. Nasal washings and aspirates are unacceptable for Xpert Xpress SARS-CoV-2/FLU/RSV testing.  Fact Sheet for Patients: [place]  Fact Sheet for Healthcare Providers: BloggerCourse.com  This test is not yet approved or cleared by the [employer]ious[employer] FDA and has been authorized for detection and/or diagnosis of SARS-CoV-2 by FDA under  an Emergency Use Authorization (EUA). This EUA will remain in effect (meaning this test can be used) for the duration of the COVID-19 declaration under Section 564(b)(1) of the Act, 21 U.S.C. section 360bbb-3(b)(1), unless the authorization is terminated or revoked.  Performed at [facility], [address] [place], [place]       Studies: No results found.  Scheduled Meds:  carvedilol  3.125 mg Oral BID WC   Chlorhexidine Gluconate Cloth  6 each Topical Q0600   folic acid  1 mg Oral Daily   insulin aspart  0-5 Units Subcutaneous QHS   insulin aspart  0-9 Units Subcutaneous TID WC   multivitamin with minerals  1 tablet Oral Daily   mupirocin ointment  1 application. Nasal BID   pantoprazole (PROTONIX) IV  40 mg Intravenous BID   sodium chloride flush  3 mL Intravenous Q12H   thiamine  100 mg Oral Daily   Or   thiamine  100 mg Intravenous Daily  Continuous Infusions:  heparin 1,300 Units/hr (05/15/21 0722)     LOS: 1 day     Darlin Drop, MD [facility] Pager [phone]  If 7PM-7AM, please contact night-coverage [email or web address] Password [facility] 05/15/2021, 12:23 PM

## 2021-05-16 ENCOUNTER — Encounter (HOSPITAL_COMMUNITY): Payer: Self-pay | Admitting: Interventional Cardiology

## 2021-05-16 ENCOUNTER — Encounter (HOSPITAL_COMMUNITY)
Admission: EM | Disposition: A | Discharge status: Home / Self Care | Payer: Self-pay | Source: Home / Self Care | Attending: Internal Medicine

## 2021-05-16 DIAGNOSIS — E876 Hypokalemia: Secondary | ICD-10-CM

## 2021-05-16 DIAGNOSIS — R55 Syncope and collapse: Secondary | ICD-10-CM

## 2021-05-16 DIAGNOSIS — R7989 Other specified abnormal findings of blood chemistry: Secondary | ICD-10-CM

## 2021-05-16 DIAGNOSIS — I5021 Acute systolic (congestive) heart failure: Secondary | ICD-10-CM

## 2021-05-16 HISTORY — PX: RIGHT/LEFT HEART CATH AND CORONARY ANGIOGRAPHY: CATH118266

## 2021-05-16 LAB — POCT I-STAT EG7
Acid-Base Excess: 3 mmol/L — ABNORMAL HIGH (ref 0.0–2.0)
Acid-Base Excess: 4 mmol/L — ABNORMAL HIGH (ref 0.0–2.0)
Bicarbonate: 28.7 mmol/L — ABNORMAL HIGH (ref 20.0–28.0)
Bicarbonate: 29.1 mmol/L — ABNORMAL HIGH (ref 20.0–28.0)
Calcium, Ion: 1.27 mmol/L (ref 1.15–1.40)
Calcium, Ion: 1.29 mmol/L (ref 1.15–1.40)
HCT: 43 % (ref 39.0–52.0)
HCT: 43 % (ref 39.0–52.0)
Hemoglobin: 14.6 g/dL (ref 13.0–17.0)
Hemoglobin: 14.6 g/dL (ref 13.0–17.0)
O2 Saturation: 69 %
O2 Saturation: 71 %
Potassium: 4.1 mmol/L (ref 3.5–5.1)
Potassium: 4.1 mmol/L (ref 3.5–5.1)
Sodium: 138 mmol/L (ref 135–145)
Sodium: 138 mmol/L (ref 135–145)
TCO2: 30 mmol/L (ref 22–32)
TCO2: 31 mmol/L (ref 22–32)
pCO2, Ven: 46.6 mmHg (ref 44–60)
pCO2, Ven: 46.8 mmHg (ref 44–60)
pH, Ven: 7.398 (ref 7.25–7.43)
pH, Ven: 7.403 (ref 7.25–7.43)
pO2, Ven: 36 mmHg (ref 32–45)
pO2, Ven: 37 mmHg (ref 32–45)

## 2021-05-16 LAB — POCT I-STAT 7, (LYTES, BLD GAS, ICA,H+H)
Acid-Base Excess: 2 mmol/L (ref 0.0–2.0)
Bicarbonate: 26.2 mmol/L (ref 20.0–28.0)
Calcium, Ion: 1.2 mmol/L (ref 1.15–1.40)
HCT: 41 % (ref 39.0–52.0)
Hemoglobin: 13.9 g/dL (ref 13.0–17.0)
O2 Saturation: 97 %
Potassium: 3.9 mmol/L (ref 3.5–5.1)
Sodium: 138 mmol/L (ref 135–145)
TCO2: 27 mmol/L (ref 22–32)
pCO2 arterial: 39.5 mmHg (ref 32–48)
pH, Arterial: 7.431 (ref 7.35–7.45)
pO2, Arterial: 92 mmHg (ref 83–108)

## 2021-05-16 LAB — BASIC METABOLIC PANEL
Anion gap: 8 (ref 5–15)
BUN: 13 mg/dL (ref 6–20)
CO2: 27 mmol/L (ref 22–32)
Calcium: 8.6 mg/dL — ABNORMAL LOW (ref 8.9–10.3)
Chloride: 101 mmol/L (ref 98–111)
Creatinine, Ser: 0.98 mg/dL (ref 0.61–1.24)
GFR, Estimated: >60 mL/min (ref >60)
Glucose, Bld: 91 mg/dL (ref 70–99)
Potassium: 3.7 mmol/L (ref 3.5–5.1)
Sodium: 136 mmol/L (ref 135–145)

## 2021-05-16 LAB — MAGNESIUM: Magnesium: 1.9 mg/dL (ref 1.7–2.4)

## 2021-05-16 LAB — CBC
HCT: 41.9 % (ref 39.0–52.0)
Hemoglobin: 14 g/dL (ref 13.0–17.0)
MCH: 27.7 pg (ref 26.0–34.0)
MCHC: 33.4 g/dL (ref 30.0–36.0)
MCV: 83 fL (ref 80.0–100.0)
Platelets: 274 10*3/uL (ref 150–400)
RBC: 5.05 MIL/uL (ref 4.22–5.81)
RDW: 13.8 % (ref 11.5–15.5)
WBC: 9.4 10*3/uL (ref 4.0–10.5)
nRBC: 0 % (ref 0.0–0.2)

## 2021-05-16 LAB — GLUCOSE, CAPILLARY
Glucose-Capillary: 115 mg/dL — ABNORMAL HIGH (ref 70–99)
Glucose-Capillary: 122 mg/dL — ABNORMAL HIGH (ref 70–99)
Glucose-Capillary: 230 mg/dL — ABNORMAL HIGH (ref 70–99)
Glucose-Capillary: 109 mg/dL — ABNORMAL HIGH (ref 70–99)

## 2021-05-16 LAB — HEMOGLOBIN A1C
Hgb A1c MFr Bld: 6.7 % — ABNORMAL HIGH (ref 4.8–5.6)
Mean Plasma Glucose: 146 mg/dL

## 2021-05-16 LAB — HEPARIN LEVEL (UNFRACTIONATED)
Heparin Unfractionated: >1.1 IU/mL — ABNORMAL HIGH (ref 0.30–0.70)
Heparin Unfractionated: >1.1 IU/mL — ABNORMAL HIGH (ref 0.30–0.70)

## 2021-05-16 LAB — PHOSPHORUS: Phosphorus: 3.9 mg/dL (ref 2.5–4.6)

## 2021-05-16 LAB — POCT ACTIVATED CLOTTING TIME: Activated Clotting Time: 203 seconds

## 2021-05-16 SURGERY — RIGHT/LEFT HEART CATH AND CORONARY ANGIOGRAPHY
Anesthesia: LOCAL

## 2021-05-16 MED ORDER — LABETALOL HCL 5 MG/ML IV SOLN: 10.0000 mg | INTRAVENOUS | Status: AC | PRN

## 2021-05-16 MED ORDER — VERAPAMIL HCL 2.5 MG/ML IV SOLN
INTRAVENOUS | Status: DC | PRN
  Administered 2021-05-16: 10 mL via INTRA_ARTERIAL

## 2021-05-16 MED ORDER — MIDAZOLAM HCL 2 MG/2ML IJ SOLN
INTRAMUSCULAR | Status: DC | PRN
  Administered 2021-05-16: 2 mg via INTRAVENOUS
  Administered 2021-05-16: 1 mg via INTRAVENOUS

## 2021-05-16 MED ORDER — HEPARIN SODIUM (PORCINE) 1000 UNIT/ML IJ SOLN
INTRAMUSCULAR | Status: DC | PRN
  Administered 2021-05-16: 3000 [IU] via INTRAVENOUS

## 2021-05-16 MED ORDER — FENTANYL CITRATE (PF) 100 MCG/2ML IJ SOLN
INTRAMUSCULAR | Status: DC | PRN
  Administered 2021-05-16 (×2): 25 ug via INTRAVENOUS

## 2021-05-16 MED ORDER — HEPARIN (PORCINE) IN NACL 1000-0.9 UT/500ML-% IV SOLN
INTRAVENOUS | Status: AC
  Filled 2021-05-16: qty 1000

## 2021-05-16 MED ORDER — SODIUM CHLORIDE 0.9% FLUSH: 3.0000 mL | INTRAVENOUS | Status: DC | PRN

## 2021-05-16 MED ORDER — HEPARIN SODIUM (PORCINE) 1000 UNIT/ML IJ SOLN
INTRAMUSCULAR | Status: AC
  Filled 2021-05-16: qty 10

## 2021-05-16 MED ORDER — MAGNESIUM SULFATE IN D5W 1-5 GM/100ML-% IV SOLN
1.0000 g | Freq: Once | INTRAVENOUS | Status: AC
  Administered 2021-05-16: 1 g via INTRAVENOUS
  Filled 2021-05-16: qty 100

## 2021-05-16 MED ORDER — IOHEXOL 350 MG/ML SOLN
INTRAVENOUS | Status: DC | PRN
  Administered 2021-05-16: 35 mL via INTRACARDIAC

## 2021-05-16 MED ORDER — HEPARIN (PORCINE) IN NACL 1000-0.9 UT/500ML-% IV SOLN
INTRAVENOUS | Status: DC | PRN
  Administered 2021-05-16 (×2): 500 mL

## 2021-05-16 MED ORDER — LIDOCAINE HCL (PF) 1 % IJ SOLN
INTRAMUSCULAR | Status: AC
  Filled 2021-05-16: qty 30

## 2021-05-16 MED ORDER — PANTOPRAZOLE SODIUM 40 MG PO TBEC
40.0000 mg | DELAYED_RELEASE_TABLET | Freq: Every day | ORAL | Status: DC
  Administered 2021-05-17: 40 mg via ORAL
  Filled 2021-05-16: qty 1

## 2021-05-16 MED ORDER — HEPARIN (PORCINE) 25000 UT/250ML-% IV SOLN
1050.0000 [IU]/h | INTRAVENOUS | Status: DC
  Administered 2021-05-16: 1050 [IU]/h via INTRAVENOUS
  Filled 2021-05-16: qty 250

## 2021-05-16 MED ORDER — SODIUM CHLORIDE 0.9 % IV SOLN: 250.0000 mL | INTRAVENOUS | Status: DC | PRN

## 2021-05-16 MED ORDER — HYDRALAZINE HCL 20 MG/ML IJ SOLN: 10.0000 mg | INTRAMUSCULAR | Status: AC | PRN

## 2021-05-16 MED ORDER — MIDAZOLAM HCL 2 MG/2ML IJ SOLN
INTRAMUSCULAR | Status: AC
  Filled 2021-05-16: qty 2

## 2021-05-16 MED ORDER — SODIUM CHLORIDE 0.9% FLUSH
3.0000 mL | Freq: Two times a day (BID) | INTRAVENOUS | Status: DC
  Administered 2021-05-16: 3 mL via INTRAVENOUS

## 2021-05-16 MED ORDER — ONDANSETRON HCL 4 MG/2ML IJ SOLN: 4.0000 mg | Freq: Four times a day (QID) | INTRAMUSCULAR | Status: DC | PRN

## 2021-05-16 MED ORDER — LIVING BETTER WITH HEART FAILURE BOOK
Freq: Once | Status: AC
  Administered 2021-05-16: 1

## 2021-05-16 MED ORDER — LIDOCAINE HCL (PF) 1 % IJ SOLN
INTRAMUSCULAR | Status: DC | PRN
Start: 2021-05-16 — End: 2021-05-16
  Administered 2021-05-16: 5 mL

## 2021-05-16 MED ORDER — POTASSIUM CHLORIDE CRYS ER 20 MEQ PO TBCR
30.0000 meq | EXTENDED_RELEASE_TABLET | Freq: Once | ORAL | Status: AC
  Administered 2021-05-16: 30 meq via ORAL
  Filled 2021-05-16: qty 1

## 2021-05-16 MED ORDER — VERAPAMIL HCL 2.5 MG/ML IV SOLN
INTRAVENOUS | Status: AC
  Filled 2021-05-16: qty 2

## 2021-05-16 MED ORDER — FENTANYL CITRATE (PF) 100 MCG/2ML IJ SOLN
INTRAMUSCULAR | Status: AC
  Filled 2021-05-16: qty 2

## 2021-05-16 MED ORDER — ACETAMINOPHEN 325 MG PO TABS: 650.0000 mg | ORAL_TABLET | ORAL | Status: DC | PRN

## 2021-05-16 SURGICAL SUPPLY — 15 items
CATH BALLN WEDGE 5F 110CM (CATHETERS) ×2 IMPLANT
CATH INFINITI 5 FR JL3.5 (CATHETERS) ×2 IMPLANT
CATH INFINITI JR4 5F (CATHETERS) ×2 IMPLANT
CATH SWAN GANZ 7F STRAIGHT (CATHETERS) ×2 IMPLANT
DEVICE RAD COMP TR BAND LRG (VASCULAR PRODUCTS) ×2 IMPLANT
GLIDESHEATH SLEND SS 6F .021 (SHEATH) ×2 IMPLANT
GUIDEWIRE INQWIRE 1.5J.035X260 (WIRE) IMPLANT
INQWIRE 1.5J .035X260CM (WIRE) ×3
KIT HEART LEFT (KITS) ×3 IMPLANT
PACK CARDIAC CATHETERIZATION (CUSTOM PROCEDURE TRAY) ×3 IMPLANT
SHEATH GLIDE SLENDER 4/5FR (SHEATH) ×2 IMPLANT
SHEATH PINNACLE 7F 10CM (SHEATH) ×2 IMPLANT
SHEATH PROBE COVER 6X72 (BAG) ×2 IMPLANT
TRANSDUCER W/STOPCOCK (MISCELLANEOUS) ×3 IMPLANT
TUBING CIL FLEX 10 FLL-RA (TUBING) ×3 IMPLANT

## 2021-05-16 NOTE — Progress Notes (Signed)
Received patient from [facility] via [facility].  Skin warm and dry resp even and unlabored,  denies CP at this time.  IVF of NS and Heparin infusing.  Pt on monitor, consent signed and waiting for cath procedure ?

## 2021-05-16 NOTE — Interval H&P Note (Signed)
Cath Lab Visit (complete for each Cath Lab visit) ? ?Clinical Evaluation Leading to the Procedure:  ? ?ACS: Yes.   ? ?Non-ACS:   ? ?Anginal Classification: CCS IV ? ?Anti-ischemic medical therapy: Minimal Therapy (1 class of medications) ? ?Non-Invasive Test Results: High-risk stress test findings: cardiac mortality >3%/year ? ?Prior CABG: No previous CABG ? ?Low EF ? ? ? ?History and Physical Interval Note: ? ?05/16/2021 ?12:40 PM ? ?David [name]  has presented today for surgery, with the diagnosis of NSTEMI.  The various methods of treatment have been discussed with the patient and family. After consideration of risks, benefits and other options for treatment, the patient has consented to  Procedure(s): ?RIGHT/LEFT HEART CATH AND CORONARY ANGIOGRAPHY (N/A) as a surgical intervention.  The patient's history has been reviewed, patient examined, no change in status, stable for surgery.  I have reviewed the patient's chart and labs.  Questions were answered to the patient's satisfaction.   ? ? ?David [name] ? ? ?

## 2021-05-16 NOTE — Progress Notes (Signed)
ANTICOAGULATION CONSULT NOTE - follow up ? ?Pharmacy Consult for heparin ?Indication:  LV Thrombus ? ?No Known Allergies ? ?Patient Measurements: ?Height: 5\' 6"  (167.6 cm) ?Weight: 69.3 kg (152 lb 12.5 oz) ?IBW/kg (Calculated) : 63.8 ?Heparin Dosing Weight: n/a. Use TBW = 69 kg ? ?Vital Signs: ?Temp: 98.5 ?F (36.9 ?C) (03/13 0426) ?Temp Source: Oral (03/13 0426) ?BP: 141/102 (03/13 0426) ?Pulse Rate: 83 (03/13 0426) ? ?Labs: ?Recent Labs  ?  05/13/21 ?1900 05/13/21 ?2202 05/13/21 ?2358 05/14/21 ?0416 05/14/21 ?1918 05/15/21 ?0148 05/16/21 ?05/18/21 05/16/21 ?0436  ?HGB  --   --   --  16.5  --  13.3 14.0  --   ?HCT  --   --   --  50.5  --  41.0 41.9  --   ?PLT  --   --   --  300  --  264 274  --   ?HEPARINUNFRC  --   --   --   --    < > 0.53 >1.10* >1.10*  ?CREATININE  --   --  1.45* 1.21  --  0.98 0.98  --   ?TROPONINIHS 1,397* 1,085* 1,110*  --   --   --   --   --   ? < > = values in this interval not displayed.  ? ? ? ?Estimated Creatinine Clearance: 92.2 mL/min (by C-G formula based on SCr of 0.98 mg/dL). ? ? ?Medical History: ?Past Medical History:  ?Diagnosis Date  ? Diabetes mellitus (HCC)   ? Hypertension   ? Metacarpal bone fracture 07/11/2015  ? right small  ? Neuropathy   ? Seizure with provoking factor (HCC)   ? 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 ? ?Medications: No anticoagulants PTA ? ?Assessment: ?Patient is a 71 yoM with PMH of T2DM, GIB, polysubstance abuse. Presented after syncopal episode, pharmacy consulted to dose heparin drip by cardiology for LV thrombus. ? ?Baseline labs: ?-Hgb 16.5 ?-Plt 300 ? ?05/16/21, Today ?HL >1.1 supra-therapeutic on 1300 units/hr ( had drawn again since had been therapeutic) ?CBC WNL ?Per RN no interruptions or bleeding ? ?Goal of Therapy:  ?Heparin level 0.3-0.7 units/ml ?Monitor platelets by anticoagulation protocol: Yes ?  ?Plan:  ?Hold heparin  x 1 hour ?Resume drip at 1050 units/hr ?Heparin level in 6 hours ?Daily heparin level ?Daily CBC ? ?05/18/21  RPh ?05/16/2021, 5:17 AM ? ?

## 2021-05-16 NOTE — Progress Notes (Signed)
TR BAND REMOVAL ? ?LOCATION:    Right radial ? ?DEFLATED PER PROTOCOL:    Yes.   ? ?TIME BAND OFF / DRESSING APPLIED:    1600 pm a clean dry dressing applied with gauze and tegaderm wrapped with coban  ? ?SITE UPON ARRIVAL:    Level 0 ? ?SITE AFTER BAND REMOVAL:    Level 0 ? ?CIRCULATION SENSATION AND MOVEMENT:    Within Normal Limits   Yes.   ? ?COMMENTS:   Care instruction  given to patient and report call to RN ?

## 2021-05-16 NOTE — Interval H&P Note (Signed)
Cath Lab Visit (complete for each Cath Lab visit) ? ?Clinical Evaluation Leading to the Procedure:  ? ?ACS: Yes.   ? ?Non-ACS:   ? ?Anginal Classification: CCS IV ? ?Anti-ischemic medical therapy: Minimal Therapy (1 class of medications) ? ?Non-Invasive Test Results: High-risk stress test findings: cardiac mortality >3%/year ? ?Prior CABG: No previous CABG ? ?Low EF by echo ? ? ? ?History and Physical Interval Note: ? ?05/16/2021 ?12:30 PM ? ?David [name]  has presented today for surgery, with the diagnosis of NSTEMI.  The various methods of treatment have been discussed with the patient and family. After consideration of risks, benefits and other options for treatment, the patient has consented to  Procedure(s): ?RIGHT/LEFT HEART CATH AND CORONARY ANGIOGRAPHY (N/A) as a surgical intervention.  The patient's history has been reviewed, patient examined, no change in status, stable for surgery.  I have reviewed the patient's chart and labs.  Questions were answered to the patient's satisfaction.   ? ? ?David [name] ? ? ?

## 2021-05-16 NOTE — H&P (View-Only) (Signed)
Progress Note  Patient Name: David [name] Date of Encounter: 05/16/2021  Primary Cardiologist: Jodelle Red, MD  Subjective   Pt denies any acute complaints, had questions about blood clot in his heart. Family present (mother, aunt in discussion; sister is sleeping). I called cath lab and they said procedure will likely be between 2-5pm.  Inpatient Medications    Scheduled Meds:  carvedilol  3.125 mg Oral BID WC   Chlorhexidine Gluconate Cloth  6 each Topical Q0600   folic acid  1 mg Oral Daily   insulin aspart  0-5 Units Subcutaneous QHS   insulin aspart  0-9 Units Subcutaneous TID WC   multivitamin with minerals  1 tablet Oral Daily   mupirocin ointment  1 application. Nasal BID   pantoprazole (PROTONIX) IV  40 mg Intravenous BID   sodium chloride flush  3 mL Intravenous Q12H   sodium chloride flush  3 mL Intravenous Q12H   thiamine  100 mg Oral Daily   Or   thiamine  100 mg Intravenous Daily   Continuous Infusions:  sodium chloride     sodium chloride 10 mL/hr at 05/15/21 2326   heparin 1,050 Units/hr (05/16/21 0627)   PRN Meds: sodium chloride, acetaminophen **OR** acetaminophen, hydrALAZINE, LORazepam **OR** LORazepam, polyethylene glycol, sodium chloride flush   Vital Signs    Vitals:   05/15/21 1330 05/15/21 1953 05/16/21 0426 05/16/21 0500  BP: (!) 138/100 (!) 134/100 (!) 141/102   Pulse: 73 77 83   Resp: 20 16 16    Temp: 98.2 F (36.8 C) 97.8 F (36.6 C) 98.5 F (36.9 C)   TempSrc: Oral Oral Oral   SpO2: 100% 100% 100%   Weight:    69.4 kg  Height:        Intake/Output Summary (Last 24 hours) at 05/16/2021 1003 Last data filed at 05/16/2021 05/18/2021 Gross per 24 hour  Intake 893.6 ml  Output 1400 ml  Net -506.4 ml   Last 3 Weights 05/16/2021 05/15/2021 05/13/2021  Weight (lbs) 153 lb 152 lb 12.5 oz 152 lb 9.6 oz  Weight (kg) 69.4 kg 69.3 kg 69.219 kg     Telemetry    NSR - Personally Reviewed  Physical Exam   GEN: No acute  distress.  HEENT: Normocephalic, atraumatic, sclera non-icteric. Facial tattoos noted. Neck: No JVD or bruits. Cardiac: RRR no murmurs, rubs, or gallops.  Respiratory: Clear to auscultation bilaterally. Breathing is unlabored. GI: Soft, nontender, non-distended, BS +x 4. MS: no deformity. Extremities: No clubbing or cyanosis. No edema. Distal pedal pulses are 2+ and equal bilaterally. Neuro:  AAOx3. Follows commands. Psych:  Responds to questions appropriately with a normal affect.  Labs    High Sensitivity Troponin:   Recent Labs  Lab 05/13/21 1652 05/13/21 1900 05/13/21 2202 05/13/21 2358  TROPONINIHS 1,237* 1,397* 1,085* 1,110*      Cardiac EnzymesNo results for input(s): TROPONINI in the last 168 hours. No results for input(s): TROPIPOC in the last 168 hours.   Chemistry Recent Labs  Lab 05/13/21 1652 05/13/21 2358 05/14/21 0416 05/15/21 0148 05/16/21 0317  NA 137   < > 140 135 136  K 2.8*   < > 4.4 3.8 3.7  CL 109   < > 97* 105 101  CO2 21*   < > 24 24 27   GLUCOSE 170*   < > 87 115* 91  BUN 29*   < > 30* 21* 13  CREATININE 1.62*   < > 1.21 0.98 0.98  CALCIUM  6.5*   < > 9.2 8.4* 8.6*  PROT 5.3*  --  6.5 6.2*  --   ALBUMIN 2.6*  --  3.2* 3.1*  --   AST 23  --  46* 18  --   ALT 11  --  19 14  --   ALKPHOS 32*  --  40 35*  --   BILITOT 1.0  --  1.2 0.4  --   GFRNONAA 55*   < > >60 >60 >60  ANIONGAP 7   < > 19* 6 8   < > = values in this interval not displayed.     Hematology Recent Labs  Lab 05/14/21 0416 05/15/21 0148 05/16/21 0317  WBC 10.4 8.3 9.4  RBC 5.99* 4.89 5.05  HGB 16.5 13.3 14.0  HCT 50.5 41.0 41.9  MCV 84.3 83.8 83.0  MCH 27.5 27.2 27.7  MCHC 32.7 32.4 33.4  RDW 14.0 13.9 13.8  PLT 300 264 274    BNPNo results for input(s): BNP, PROBNP in the last 168 hours.   DDimer No results for input(s): DDIMER in the last 168 hours.   Radiology    ECHOCARDIOGRAM COMPLETE  Result Date: 05/14/2021    ECHOCARDIOGRAM REPORT   Patient Name:    David [name] Date of Exam: 05/14/2021 Medical Rec #:  [id number]        Height:       66.0 in Accession #:    [id number]       Weight:       152.6 lb Date of Birth:  21-Aug-1980        BSA:          1.783 m Patient Age:    40 years         BP:           116/88 mmHg Patient Gender: M                HR:           77 bpm. Exam Location:  Inpatient Procedure: 2D Echo, 3D Echo, Cardiac Doppler, Color Doppler and Intracardiac            Opacification Agent Indications:    R55 Syncope  History:        Patient has prior history of Echocardiogram examinations, most                 recent 08/16/2020. Signs/Symptoms:Chest Pain; Risk                 Factors:Hypertension, Diabetes and Current Smoker. Polysubstance                 abuse. Elevated troponin. ETOH.  Sonographer:    Sheralyn Boatman RDCS Referring Phys: [id number] Cecille Po MELVIN IMPRESSIONS  1. Global hypokinesis, but mid to apical segments worse than base. 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 Small LV thrombus noted at apex, 0.98 x 0.75 cm. Left ventricular ejection fraction, by estimation, is 25 to 30%. The left ventricle has severely decreased function. The left ventricle demonstrates global hypokinesis. Left ventricular diastolic parameters are consistent with Grade I diastolic dysfunction (impaired relaxation).  2. Right ventricular systolic function is normal. The right ventricular size is normal. Tricuspid regurgitation signal is inadequate for assessing PA pressure.  3. The mitral valve is normal in structure. Trivial mitral valve regurgitation. No evidence of mitral stenosis.  4. The aortic valve is grossly normal. Aortic valve regurgitation is not visualized. No aortic  stenosis is present.  5. The inferior vena cava is normal in size with greater than 50% respiratory variability, suggesting right atrial pressure of 3 mmHg. Comparison(s): No prior Echocardiogram. Conclusion(s)/Recommendation(s): Severely reduced LVEF, small LV  thrombus. Findings discussed with patient. FINDINGS  Left Ventricle: Global hypokinesis, but mid to apical segments worse than base. 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 Small LV thrombus noted at apex, 0.98 x 0.75 cm. Left ventricular ejection fraction, by estimation, is 25 to 30%. The left ventricle has severely decreased function. The left ventricle demonstrates global hypokinesis. Definity contrast agent was given IV to delineate the left ventricular endocardial borders. The left ventricular internal cavity size was normal in size. There is no left ventricular hypertrophy. Left ventricular diastolic parameters are consistent with Grade I diastolic dysfunction (impaired relaxation). Right Ventricle: The right ventricular size is normal. No increase in right ventricular wall thickness. Right ventricular systolic function is normal. Tricuspid regurgitation signal is inadequate for assessing PA pressure. Left Atrium: Left atrial size was normal in size. Right Atrium: Right atrial size was normal in size. Pericardium: There is no evidence of pericardial effusion. Mitral Valve: The mitral valve is normal in structure. Trivial mitral valve regurgitation. No evidence of mitral valve stenosis. Tricuspid Valve: The tricuspid valve is normal in structure. Tricuspid valve regurgitation is not demonstrated. No evidence of tricuspid stenosis. Aortic Valve: The aortic valve is grossly normal. Aortic valve regurgitation is not visualized. No aortic stenosis is present. Pulmonic Valve: The pulmonic valve was not well visualized. Pulmonic valve regurgitation is not visualized. No evidence of pulmonic stenosis. Aorta: The aortic root, ascending aorta, aortic arch and descending aorta are all structurally normal, with no evidence of dilitation or obstruction. Venous: The inferior vena cava is normal in size with greater than 50% respiratory variability, suggesting right atrial pressure of 3 mmHg.  IAS/Shunts: The atrial septum is grossly normal.  LEFT VENTRICLE PLAX 2D LVIDd:         5.20 cm      Diastology LVIDs:         4.60 cm      LV e' medial:    4.13 cm/s LV PW:         1.00 cm      LV E/e' medial:  18.0 LV IVS:        1.00 cm      LV e' lateral:   3.90 cm/s LVOT diam:     2.20 cm      LV E/e' lateral: 19.1 LV SV:         61 LV SV Index:   34 LVOT Area:     3.80 cm                              3D Volume EF: LV Volumes (MOD)            3D EF:        25 % LV vol d, MOD A2C: 94.0 ml  LV EDV:       166 ml LV vol d, MOD A4C: 116.6 ml LV ESV:       125 ml LV vol s, MOD A2C: 69.2 ml  LV SV:        41 ml LV vol s, MOD A4C: 70.2 ml LV SV MOD A2C:     24.8 ml LV SV MOD A4C:     116.6 ml LV SV MOD BP:  36.2 ml RIGHT VENTRICLE             IVC RV S prime:     13.20 cm/s  IVC diam: 1.40 cm TAPSE (M-mode): 1.1 cm LEFT ATRIUM           Index        RIGHT ATRIUM           Index LA diam:      2.90 cm 1.63 cm/m   RA Area:     13.30 cm LA Vol (A2C): 24.9 ml 13.97 ml/m  RA Volume:   33.50 ml  18.79 ml/m LA Vol (A4C): 26.8 ml 15.03 ml/m  AORTIC VALVE LVOT Vmax:   98.10 cm/s LVOT Vmean:  61.700 cm/s LVOT VTI:    0.160 m  AORTA Ao Root diam: 3.20 cm Ao Asc diam:  3.30 cm MITRAL VALVE MV Area (PHT): 3.81 cm    SHUNTS MV Decel Time: 199 msec    Systemic VTI:  0.16 m MV E velocity: 74.30 cm/s  Systemic Diam: 2.20 cm MV A velocity: 97.90 cm/s MV E/A ratio:  0.76 Jodelle Red MD Electronically signed by Jodelle Red MD Signature Date/Time: 05/14/2021/12:18:54 PM    Final     Cardiac Studies   2d echo 05/14/21  1. Global hypokinesis, but mid to apical segments worse than base.  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 Small LV  thrombus noted at apex, 0.98 x 0.75 cm. Left ventricular ejection  fraction, by estimation, is 25 to 30%. The  left ventricle has severely decreased function. The left ventricle  demonstrates global hypokinesis. Left ventricular diastolic  parameters are  consistent with Grade I diastolic dysfunction (impaired relaxation).   2. Right ventricular systolic function is normal. The right ventricular  size is normal. Tricuspid regurgitation signal is inadequate for assessing  PA pressure.   3. The mitral valve is normal in structure. Trivial mitral valve  regurgitation. No evidence of mitral stenosis.   4. The aortic valve is grossly normal. Aortic valve regurgitation is not  visualized. No aortic stenosis is present.   5. The inferior vena cava is normal in size with greater than 50%  respiratory variability, suggesting right atrial pressure of 3 mmHg.   Comparison(s): No prior Echocardiogram.   Conclusion(s)/Recommendation(s): Severely reduced LVEF, small LV thrombus.  Findings discussed with patient.   Patient Profile     41 y.o. male with hx of substance abuse (ETOH abuse, cocaine), GI bleed/gastritis, type II diabetes on insulin who presented to the hospital with several day history of nausea and vomiting followed by episode of syncope. Initial labs showed AKI, hypokalemia, hypocalcemia with hypoalbuminemia, hemoconcentrated Hgb, elevated troponin. UDS + cocaine, opiates.  Assessment & Plan    1. Elevated troponin/newly recognized cardiomyopathy/acute combined CHF - hsTroponins high but flat - [phone] on admission  - echo 05/14/21 with global HK EF 25-30% - Dr. Cristal Deer felt this could represent Takotsubo/NICM but needs cardiac cath to discern whether CAD present - cardiac cath planned for today (consented 3/12 note) - currently treated with carvedilol (suspect chosen in lieu of metoprolol due to cocaine use) - post cath anticipate starting ARB +/- spironolactone as BP/kidney function dicate - volume status looks OK - will rx CHF booklet; also discussed daily weights  2. Syncope - given clinical hx and electrolyte abnormalities, this was suspected due to dehydration - consider event monitor at DC - Qt  prolongation noted on EKG 05/14/21 - - will repeat EKG today (~  on telemetry)  3. LV thrombus - remains on heparin per pharmacy, anticipate plan to transition to [facility] depending on MD preference post cath -> has no insurance so care management consulted  4. Substance abuse - UDS + cocaine, opiates, per PMH drinks minimum of 4 shots of liquor daily - abstinence of harmful substances will be vital for prognosis - discussed generalized recommendation to aovid harmful substances as part of HF education, did not go into detail about use with family present  5. AKI with electrolyte disturbances - improving -> K 3.7, Mg 1.9, calcium 8.6, creatinine normalized - given QT prolongation, will replete K and Mg today  6. DM2 - consider SGLT2i in future regimen, likely need to revisit as OP given lack of insurance  For questions or updates, please contact [facility] Please consult [email or web address] for contact info under Cardiology/STEMI.  Signed, Laurann Montana, PA-C 05/16/2021, 10:03 AM    History and all data above reviewed.  Patient examined.  I agree with the findings as above.  Denies pain or SOB. The patient exam reveals COR:  RRR  ,  Lungs:   Clear  ,  Abd: Positive bowel sounds, no rebound no guarding, Ext No edema  .  All available labs, radiology testing, previous records reviewed. Agree with documented assessment and plan.   Acute systolic HF:  Cath today.  EKG is unchanged with deep anterolateral T wave inversion and QT prolonged.  Suspect non ischemic cause.  Needs med titration and will need caseworker help with meds.    LV thrombus:  would suggest  given the appearance of the LV apex and the question of clot that the safest approach would be anticoagulation with Xarelto for 3 months or so.   Fayrene Fearing [facility]  11:33 AM  05/16/2021

## 2021-05-16 NOTE — Progress Notes (Addendum)
Progress Note  Patient Name: David [name] Date of Encounter: 05/16/2021  Primary Cardiologist: Jodelle Red, MD  Subjective   Pt denies any acute complaints, had questions about blood clot in his heart. Family present (mother, aunt in discussion; sister is sleeping). I called cath lab and they said procedure will likely be between 2-5pm.  Inpatient Medications    Scheduled Meds:  carvedilol  3.125 mg Oral BID WC   Chlorhexidine Gluconate Cloth  6 each Topical Q0600   folic acid  1 mg Oral Daily   insulin aspart  0-5 Units Subcutaneous QHS   insulin aspart  0-9 Units Subcutaneous TID WC   multivitamin with minerals  1 tablet Oral Daily   mupirocin ointment  1 application. Nasal BID   pantoprazole (PROTONIX) IV  40 mg Intravenous BID   sodium chloride flush  3 mL Intravenous Q12H   sodium chloride flush  3 mL Intravenous Q12H   thiamine  100 mg Oral Daily   Or   thiamine  100 mg Intravenous Daily   Continuous Infusions:  sodium chloride     sodium chloride 10 mL/hr at 05/15/21 2326   heparin 1,050 Units/hr (05/16/21 0627)   PRN Meds: sodium chloride, acetaminophen **OR** acetaminophen, hydrALAZINE, LORazepam **OR** LORazepam, polyethylene glycol, sodium chloride flush   Vital Signs    Vitals:   05/15/21 1330 05/15/21 1953 05/16/21 0426 05/16/21 0500  BP: (!) 138/100 (!) 134/100 (!) 141/102   Pulse: 73 77 83   Resp: 20 16 16    Temp: 98.2 F (36.8 C) 97.8 F (36.6 C) 98.5 F (36.9 C)   TempSrc: Oral Oral Oral   SpO2: 100% 100% 100%   Weight:    69.4 kg  Height:        Intake/Output Summary (Last 24 hours) at 05/16/2021 1003 Last data filed at 05/16/2021 05/18/2021 Gross per 24 hour  Intake 893.6 ml  Output 1400 ml  Net -506.4 ml   Last 3 Weights 05/16/2021 05/15/2021 05/13/2021  Weight (lbs) 153 lb 152 lb 12.5 oz 152 lb 9.6 oz  Weight (kg) 69.4 kg 69.3 kg 69.219 kg     Telemetry    NSR - Personally Reviewed  Physical Exam   GEN: No acute  distress.  HEENT: Normocephalic, atraumatic, sclera non-icteric. Facial tattoos noted. Neck: No JVD or bruits. Cardiac: RRR no murmurs, rubs, or gallops.  Respiratory: Clear to auscultation bilaterally. Breathing is unlabored. GI: Soft, nontender, non-distended, BS +x 4. MS: no deformity. Extremities: No clubbing or cyanosis. No edema. Distal pedal pulses are 2+ and equal bilaterally. Neuro:  AAOx3. Follows commands. Psych:  Responds to questions appropriately with a normal affect.  Labs    High Sensitivity Troponin:   Recent Labs  Lab 05/13/21 1652 05/13/21 1900 05/13/21 2202 05/13/21 2358  TROPONINIHS 1,237* 1,397* 1,085* 1,110*      Cardiac EnzymesNo results for input(s): TROPONINI in the last 168 hours. No results for input(s): TROPIPOC in the last 168 hours.   Chemistry Recent Labs  Lab 05/13/21 1652 05/13/21 2358 05/14/21 0416 05/15/21 0148 05/16/21 0317  NA 137   < > 140 135 136  K 2.8*   < > 4.4 3.8 3.7  CL 109   < > 97* 105 101  CO2 21*   < > 24 24 27   GLUCOSE 170*   < > 87 115* 91  BUN 29*   < > 30* 21* 13  CREATININE 1.62*   < > 1.21 0.98 0.98  CALCIUM  6.5*   < > 9.2 8.4* 8.6*  °PROT 5.3*  --  6.5 6.2*  --   °ALBUMIN 2.6*  --  3.2* 3.1*  --   °AST 23  --  46* 18  --   °ALT 11  --  19 14  --   °ALKPHOS 32*  --  40 35*  --   °BILITOT 1.0  --  1.2 0.4  --   °GFRNONAA 55*   < > >60 >60 >60  °ANIONGAP 7   < > 19* 6 8  ° < > = values in this interval not displayed.  °  ° °Hematology °Recent Labs  °Lab 05/14/21 °0416 05/15/21 °0148 05/16/21 °0317  °WBC 10.4 8.3 9.4  °RBC 5.99* 4.89 5.05  °HGB 16.5 13.3 14.0  °HCT 50.5 41.0 41.9  °MCV 84.3 83.8 83.0  °MCH 27.5 27.2 27.7  °MCHC 32.7 32.4 33.4  °RDW 14.0 13.9 13.8  °PLT 300 264 274  ° ° °BNPNo results for input(s): BNP, PROBNP in the last 168 hours.  ° °DDimer No results for input(s): DDIMER in the last 168 hours.  ° °Radiology  °  °ECHOCARDIOGRAM COMPLETE ° °Result Date: 05/14/2021 °   ECHOCARDIOGRAM REPORT   Patient Name:    Zeth T Brocksmith Date of Exam: 05/14/2021 Medical Rec #:  [id number]        Height:       66.0 in Accession #:    [id number]       Weight:       152.6 lb Date of Birth:  11/07/1980        BSA:          1.783 m² Patient Age:    40 years         BP:           116/88 mmHg Patient Gender: M                HR:           77 bpm. Exam Location:  Inpatient Procedure: 2D Echo, 3D Echo, Cardiac Doppler, Color Doppler and Intracardiac            Opacification Agent Indications:    R55 Syncope  History:        Patient has prior history of Echocardiogram examinations, most                 recent 08/16/2020. Signs/Symptoms:Chest Pain; Risk                 Factors:Hypertension, Diabetes and Current Smoker. Polysubstance                 abuse. Elevated troponin. ETOH.  Sonographer:    Tina West RDCS Referring Phys: [id number] ALEXANDER B MELVIN IMPRESSIONS  1. Global hypokinesis, but mid to apical segments worse than base. 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 Small LV thrombus noted at apex, 0.98 x 0.75 cm. Left ventricular ejection fraction, by estimation, is 25 to 30%. The left ventricle has severely decreased function. The left ventricle demonstrates global hypokinesis. Left ventricular diastolic parameters are consistent with Grade I diastolic dysfunction (impaired relaxation).  2. Right ventricular systolic function is normal. The right ventricular size is normal. Tricuspid regurgitation signal is inadequate for assessing PA pressure.  3. The mitral valve is normal in structure. Trivial mitral valve regurgitation. No evidence of mitral stenosis.  4. The aortic valve is grossly normal. Aortic valve regurgitation is not visualized. No aortic   stenosis is present.  5. The inferior vena cava is normal in size with greater than 50% respiratory variability, suggesting right atrial pressure of 3 mmHg. Comparison(s): No prior Echocardiogram. Conclusion(s)/Recommendation(s): Severely reduced LVEF, small LV  thrombus. Findings discussed with patient. FINDINGS  Left Ventricle: Global hypokinesis, but mid to apical segments worse than base. 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 Small LV thrombus noted at apex, 0.98 x 0.75 cm. Left ventricular ejection fraction, by estimation, is 25 to 30%. The left ventricle has severely decreased function. The left ventricle demonstrates global hypokinesis. Definity contrast agent was given IV to delineate the left ventricular endocardial borders. The left ventricular internal cavity size was normal in size. There is no left ventricular hypertrophy. Left ventricular diastolic parameters are consistent with Grade I diastolic dysfunction (impaired relaxation). Right Ventricle: The right ventricular size is normal. No increase in right ventricular wall thickness. Right ventricular systolic function is normal. Tricuspid regurgitation signal is inadequate for assessing PA pressure. Left Atrium: Left atrial size was normal in size. Right Atrium: Right atrial size was normal in size. Pericardium: There is no evidence of pericardial effusion. Mitral Valve: The mitral valve is normal in structure. Trivial mitral valve regurgitation. No evidence of mitral valve stenosis. Tricuspid Valve: The tricuspid valve is normal in structure. Tricuspid valve regurgitation is not demonstrated. No evidence of tricuspid stenosis. Aortic Valve: The aortic valve is grossly normal. Aortic valve regurgitation is not visualized. No aortic stenosis is present. Pulmonic Valve: The pulmonic valve was not well visualized. Pulmonic valve regurgitation is not visualized. No evidence of pulmonic stenosis. Aorta: The aortic root, ascending aorta, aortic arch and descending aorta are all structurally normal, with no evidence of dilitation or obstruction. Venous: The inferior vena cava is normal in size with greater than 50% respiratory variability, suggesting right atrial pressure of 3 mmHg.  IAS/Shunts: The atrial septum is grossly normal.  LEFT VENTRICLE PLAX 2D LVIDd:         5.20 cm      Diastology LVIDs:         4.60 cm      LV e' medial:    4.13 cm/s LV PW:         1.00 cm      LV E/e' medial:  18.0 LV IVS:        1.00 cm      LV e' lateral:   3.90 cm/s LVOT diam:     2.20 cm      LV E/e' lateral: 19.1 LV SV:         61 LV SV Index:   34 LVOT Area:     3.80 cm                              3D Volume EF: LV Volumes (MOD)            3D EF:        25 % LV vol d, MOD A2C: 94.0 ml  LV EDV:       166 ml LV vol d, MOD A4C: 116.6 ml LV ESV:       125 ml LV vol s, MOD A2C: 69.2 ml  LV SV:        41 ml LV vol s, MOD A4C: 70.2 ml LV SV MOD A2C:     24.8 ml LV SV MOD A4C:     116.6 ml LV SV MOD BP:  36.2 ml RIGHT VENTRICLE             IVC RV S prime:     13.20 cm/s  IVC diam: 1.40 cm TAPSE (M-mode): 1.1 cm LEFT ATRIUM           Index        RIGHT ATRIUM           Index LA diam:      2.90 cm 1.63 cm/m   RA Area:     13.30 cm LA Vol (A2C): 24.9 ml 13.97 ml/m  RA Volume:   33.50 ml  18.79 ml/m LA Vol (A4C): 26.8 ml 15.03 ml/m  AORTIC VALVE LVOT Vmax:   98.10 cm/s LVOT Vmean:  61.700 cm/s LVOT VTI:    0.160 m  AORTA Ao Root diam: 3.20 cm Ao Asc diam:  3.30 cm MITRAL VALVE MV Area (PHT): 3.81 cm    SHUNTS MV Decel Time: 199 msec    Systemic VTI:  0.16 m MV E velocity: 74.30 cm/s  Systemic Diam: 2.20 cm MV A velocity: 97.90 cm/s MV E/A ratio:  0.76 Jodelle Red MD Electronically signed by Jodelle Red MD Signature Date/Time: 05/14/2021/12:18:54 PM    Final     Cardiac Studies   2d echo 05/14/21  1. Global hypokinesis, but mid to apical segments worse than base.  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 Small LV  thrombus noted at apex, 0.98 x 0.75 cm. Left ventricular ejection  fraction, by estimation, is 25 to 30%. The  left ventricle has severely decreased function. The left ventricle  demonstrates global hypokinesis. Left ventricular diastolic  parameters are  consistent with Grade I diastolic dysfunction (impaired relaxation).   2. Right ventricular systolic function is normal. The right ventricular  size is normal. Tricuspid regurgitation signal is inadequate for assessing  PA pressure.   3. The mitral valve is normal in structure. Trivial mitral valve  regurgitation. No evidence of mitral stenosis.   4. The aortic valve is grossly normal. Aortic valve regurgitation is not  visualized. No aortic stenosis is present.   5. The inferior vena cava is normal in size with greater than 50%  respiratory variability, suggesting right atrial pressure of 3 mmHg.   Comparison(s): No prior Echocardiogram.   Conclusion(s)/Recommendation(s): Severely reduced LVEF, small LV thrombus.  Findings discussed with patient.   Patient Profile     41 y.o. male with hx of substance abuse (ETOH abuse, cocaine), GI bleed/gastritis, type II diabetes on insulin who presented to the hospital with several day history of nausea and vomiting followed by episode of syncope. Initial labs showed AKI, hypokalemia, hypocalcemia with hypoalbuminemia, hemoconcentrated Hgb, elevated troponin. UDS + cocaine, opiates.  Assessment & Plan    1. Elevated troponin/newly recognized cardiomyopathy/acute combined CHF - hsTroponins high but flat - [phone] on admission  - echo 05/14/21 with global HK EF 25-30% - Dr. Cristal Deer felt this could represent Takotsubo/NICM but needs cardiac cath to discern whether CAD present - cardiac cath planned for today (consented 3/12 note) - currently treated with carvedilol (suspect chosen in lieu of metoprolol due to cocaine use) - post cath anticipate starting ARB +/- spironolactone as BP/kidney function dicate - volume status looks OK - will rx CHF booklet; also discussed daily weights  2. Syncope - given clinical hx and electrolyte abnormalities, this was suspected due to dehydration - consider event monitor at DC - Qt  prolongation noted on EKG 05/14/21 - - will repeat EKG today (~  on telemetry)  3. LV thrombus - remains on heparin per pharmacy, anticipate plan to transition to [facility] depending on MD preference post cath -> has no insurance so care management consulted  4. Substance abuse - UDS + cocaine, opiates, per PMH drinks minimum of 4 shots of liquor daily - abstinence of harmful substances will be vital for prognosis - discussed generalized recommendation to aovid harmful substances as part of HF education, did not go into detail about use with family present  5. AKI with electrolyte disturbances - improving -> K 3.7, Mg 1.9, calcium 8.6, creatinine normalized - given QT prolongation, will replete K and Mg today  6. DM2 - consider SGLT2i in future regimen, likely need to revisit as OP given lack of insurance  For questions or updates, please contact [facility] Please consult [email or web address] for contact info under Cardiology/STEMI.  Signed, Laurann Montana, PA-C 05/16/2021, 10:03 AM    History and all data above reviewed.  Patient examined.  I agree with the findings as above.  Denies pain or SOB. The patient exam reveals COR:  RRR  ,  Lungs:   Clear  ,  Abd: Positive bowel sounds, no rebound no guarding, Ext No edema  .  All available labs, radiology testing, previous records reviewed. Agree with documented assessment and plan.   Acute systolic HF:  Cath today.  EKG is unchanged with deep anterolateral T wave inversion and QT prolonged.  Suspect non ischemic cause.  Needs med titration and will need caseworker help with meds.    LV thrombus:  would suggest  given the appearance of the LV apex and the question of clot that the safest approach would be anticoagulation with Xarelto for 3 months or so.   Fayrene Fearing [facility]  11:33 AM  05/16/2021

## 2021-05-16 NOTE — Progress Notes (Signed)
PROGRESS NOTE  [name] HKV:[id number] DOB: [birth year] DOA: 05/13/2021 PCP: Grayce Sessions, NP  HPI/Recap of past 24 hours: David [name] is a 41 y.o. male with medical history significant of GI bleed, LA Grade C reflux esophagitis with no bleeding, acute gastritis, type 2 diabetes, polysubstance abuse, alcohol use disorder, hypertension, who presented after an episode of syncope and collapse, he hit his head on the ground.  Reports ongoing issues with lightheadedness after starting clonidine.    Upon presentation to the ED, work-up revealed elevated troponin, high-sensitivity troponin peaked at greater than 1300, electrolyte disturbances, UDS positive for cocaine and opiates.  Seen by cardiology, had a 2D echo which revealed severely reduced LVEF and small left ventricular thrombus.  Started on heparin drip and Coreg per cardiology.  Plan for heart cath on 05/16/2021.  05/15/2021: Patient was seen at his bedside this morning.  His mother, aunt, sister and other family members were present in the room.  There were no acute events overnight.  He has no new complaints.  Plan for L/R heart cath today.    Assessment/Plan: Principal Problem:   Hypokalemia Active Problems:   Type 2 diabetes mellitus without complication, with long-term current use of insulin (HCC)   Essential hypertension   AKI (acute kidney injury) (HCC)   Polysubstance abuse (HCC)   Hypomagnesemia   Hypocalcemia   Troponin level elevated  Elevated troponin, rule out ACS He denies any anginal symptoms   High-sensitivity troponin peaked at greater than 1300 Abnormal ST-T changes. 2D echo showing severely reduced LVEF 25 to 30% with global hypokinesis, LV thrombus. Continue heparin drip, continue Plan for heart cath on 05/16/2021 Closely monitor on telemetry Appreciate cardiology's assistance  Newly diagnosed combined systolic and diastolic CHF 2D echo 05/14/2021 showed LVEF 25 to 30%, grade 1 diastolic  dysfunction, global hypokinesis, small LV thrombus. Coreg 3.125 mg twice daily started by cardiology. LDL 52, A1C 6.7 05/15/21 Continue strict I's and O's and daily weight Guideline directed medical therapy per cardiology.  Newly diagnosed left ventricular thrombus Started on heparin drip on 05/14/2021 Due to prior history of GI bleed will add IV PPI twice daily Monitor for any signs of bleeding.  Type 2 diabetes with hyperglycemia Hemoglobin A1c 6.7  C/w insulin sliding scale and avoid hypoglycemia N.p.o. after midnight for planned heart cath  History of GI bleed, LA grade C reflux esophagitis, acute gastritis Continue IV Protonix 40 mg twice daily while on heparin drip. Closely monitor H&H, stable at this time with hemoglobin of 16.5. No overt bleeding, Hg is uptrending. Switch IV Protonix 40 mg twice daily to p.o. Protonix 40 mg daily.  Resolved leukocytosis, likely reactive in the setting of acute illness Nonseptic appearing, afebrile. No evidence of active infective process.  Cocaine use disorder TOC consulted to provide resources for polysubstance abuse cessation.  Alcohol use disorder No evidence of alcohol withdrawal at the time of this visit. CIWA protocol Continue multivitamin, thiamine and folic acid supplement.      Code Status: Full code  Family Communication: Mother, aunt, sister and other family members at bedside.  Disposition Plan: Likely will discharge to home once cardiology signs off.   Consultants: Cardiology  Procedures: 2D echo 05/14/2021  Antimicrobials: None  DVT prophylaxis: Heparin drip  Status is: Likely inpatient.  Patient requires at least 2 midnights for further evaluation and treatment of present condition due to concern for ACS, newly diagnosed combined systolic and diastolic CHF with severely reduced LVEF, left ventricular  thrombus requiring IV anticoagulation.    Objective: Vitals:   05/16/21 0426 05/16/21 0500 05/16/21  1230 05/16/21 1245  BP: (!) 141/102  (!) 158/108   Pulse: 83  80   Resp: 16  20   Temp: 98.5 F (36.9 C)     TempSrc: Oral     SpO2: 100%  100% 100%  Weight:  69.4 kg    Height:        Intake/Output Summary (Last 24 hours) at 05/16/2021 1330 Last data filed at 05/16/2021 4235 Gross per 24 hour  Intake 893.6 ml  Output 800 ml  Net 93.6 ml   Filed Weights   05/13/21 2141 05/15/21 0500 05/16/21 0500  Weight: 69.2 kg 69.3 kg 69.4 kg    Exam:  General: 41 y.o. year-old male well-developed well-nourished in no acute distress.  He is alert and oriented x3.   Cardiovascular: Regular rate and rhythm normal sinus. Respiratory: Clear to auscultation no wheezes or rales. Abdomen: Soft nontender normal bowel sounds present. Musculoskeletal: No lower extremity edema bilaterally. Skin: No ulcerative lesions noted. Psychiatry: Mood is appropriate for condition and setting. Neuro: Moves all 4 extremities.   Data Reviewed: CBC: Recent Labs  Lab 05/13/21 1652 05/14/21 0416 05/15/21 0148 05/16/21 0317  WBC 11.6* 10.4 8.3 9.4  HGB 17.4* 16.5 13.3 14.0  HCT 52.9* 50.5 41.0 41.9  MCV 83.3 84.3 83.8 83.0  PLT 341 300 264 274   Basic Metabolic Panel: Recent Labs  Lab 05/13/21 1652 05/13/21 2358 05/14/21 0416 05/15/21 0148 05/16/21 0317  NA 137 134* 140 135 136  K 2.8* 3.7 4.4 3.8 3.7  CL 109 101 97* 105 101  CO2 21* 25 24 24 27   GLUCOSE 170* 145* 87 115* 91  BUN 29* 34* 30* 21* 13  CREATININE 1.62* 1.45* 1.21 0.98 0.98  CALCIUM 6.5* 8.6* 9.2 8.4* 8.6*  MG 1.4*  --  2.6* 2.1 1.9  PHOS  --   --   --  3.2 3.9   GFR: Estimated Creatinine Clearance: 92.2 mL/min (by C-G formula based on SCr of 0.98 mg/dL). Liver Function Tests: Recent Labs  Lab 05/13/21 1652 05/14/21 0416 05/15/21 0148  AST 23 46* 18  ALT 11 19 14   ALKPHOS 32* 40 35*  BILITOT 1.0 1.2 0.4  PROT 5.3* 6.5 6.2*  ALBUMIN 2.6* 3.2* 3.1*   No results for input(s): LIPASE, AMYLASE in the last 168 hours. No  results for input(s): AMMONIA in the last 168 hours. Coagulation Profile: No results for input(s): INR, PROTIME in the last 168 hours. Cardiac Enzymes: No results for input(s): CKTOTAL, CKMB, CKMBINDEX, TROPONINI in the last 168 hours. BNP (last 3 results) No results for input(s): PROBNP in the last 8760 hours. HbA1C: Recent Labs    05/15/21 0148  HGBA1C 6.7*   CBG: Recent Labs  Lab 05/15/21 0725 05/15/21 1200 05/15/21 1616 05/15/21 1951 05/16/21 0725  GLUCAP 140* 169* 164* 110* 115*   Lipid Profile: Recent Labs    05/15/21 0148  CHOL 116  HDL 46  LDLCALC 52  TRIG 90  CHOLHDL 2.5   Thyroid Function Tests: No results for input(s): TSH, T4TOTAL, FREET4, T3FREE, THYROIDAB in the last 72 hours. Anemia Panel: No results for input(s): VITAMINB12, FOLATE, FERRITIN, TIBC, IRON, RETICCTPCT in the last 72 hours. Urine analysis:    Component Value Date/Time   COLORURINE YELLOW 05/14/2021 0131   APPEARANCEUR CLEAR 05/14/2021 0131   LABSPEC 1.017 05/14/2021 0131   PHURINE 5.0 05/14/2021 0131   GLUCOSEU  50 (A) 05/14/2021 0131   HGBUR NEGATIVE 05/14/2021 0131   BILIRUBINUR NEGATIVE 05/14/2021 0131   BILIRUBINUR negative 07/28/2015 1438   BILIRUBINUR Small 06/15/2014 1841   KETONESUR NEGATIVE 05/14/2021 0131   PROTEINUR 30 (A) 05/14/2021 0131   UROBILINOGEN 0.2 07/28/2015 1438   UROBILINOGEN 0.2 09/23/2014 1009   NITRITE NEGATIVE 05/14/2021 0131   LEUKOCYTESUR NEGATIVE 05/14/2021 0131   Sepsis Labs: (procalcitonin:4,lacticidven:4)  ) Recent Results (from the past 240 hour(s))  Resp Panel by RT-PCR (Flu A&B, Covid) Nasopharyngeal Swab     Status: None   Collection Time: 05/13/21  6:36 PM   Specimen: Nasopharyngeal Swab; Nasopharyngeal(NP) swabs in vial transport medium  Result Value Ref Range Status   SARS Coronavirus 2 by RT PCR NEGATIVE NEGATIVE Final    Comment: (NOTE) SARS-CoV-2 target nucleic acids are NOT DETECTED.  The SARS-CoV-2 RNA is generally  detectable in upper respiratory specimens during the acute phase of infection. The lowest concentration of SARS-CoV-2 viral copies this assay can detect is 138 copies/mL. A negative result does not preclude SARS-Cov-2 infection and should not be used as the sole basis for treatment or other patient management decisions. A negative result may occur with  improper specimen collection/handling, submission of specimen other than nasopharyngeal swab, presence of viral mutation(s) within the areas targeted by this assay, and inadequate number of viral copies(<138 copies/mL). A negative result must be combined with clinical observations, patient history, and epidemiological information. The expected result is Negative.  Fact Sheet for Patients:  BloggerCourse.com  Fact Sheet for Healthcare Providers:  SeriousBroker.it  This test is no t yet approved or cleared by the [place] FDA and  has been authorized for detection and/or diagnosis of SARS-CoV-2 by FDA under an Emergency Use Authorization (EUA). This EUA will remain  in effect (meaning this test can be used) for the duration of the COVID-19 declaration under Section 564(b)(1) of the Act, 21 U.S.C.section 360bbb-3(b)(1), unless the authorization is terminated  or revoked sooner.       Influenza A by PCR NEGATIVE NEGATIVE Final   Influenza B by PCR NEGATIVE NEGATIVE Final    Comment: (NOTE) The Xpert Xpress SARS-CoV-2/FLU/RSV plus assay is intended as an aid in the diagnosis of influenza from Nasopharyngeal swab specimens and should not be used as a sole basis for treatment. Nasal washings and aspirates are unacceptable for Xpert Xpress SARS-CoV-2/FLU/RSV testing.  Fact Sheet for Patients: BloggerCourse.com  Fact Sheet for Healthcare Providers: SeriousBroker.it  This test is not yet approved or cleared by the [place] FDA  and has been authorized for detection and/or diagnosis of SARS-CoV-2 by FDA under an Emergency Use Authorization (EUA). This EUA will remain in effect (meaning this test can be used) for the duration of the COVID-19 declaration under Section 564(b)(1) of the Act, 21 U.S.C. section 360bbb-3(b)(1), unless the authorization is terminated or revoked.  Performed at [facility], [address] [place], [place] [address]       Studies: No results found.  Scheduled Meds:  [MAR Hold] carvedilol  3.125 mg Oral BID WC   [MAR Hold] Chlorhexidine Gluconate Cloth  6 each Topical Q0600   [MAR Hold] folic acid  1 mg Oral Daily   [MAR Hold] insulin aspart  0-5 Units Subcutaneous QHS   [MAR Hold] insulin aspart  0-9 Units Subcutaneous TID WC   [MAR Hold] Living Better with Heart Failure Book   Does not apply Once   [MAR Hold] multivitamin with minerals  1 tablet  Oral Daily   [MAR Hold] mupirocin ointment  1 application. Nasal BID   [MAR Hold] pantoprazole (PROTONIX) IV  40 mg Intravenous BID   [MAR Hold] potassium chloride  30 mEq Oral Once   [MAR Hold] sodium chloride flush  3 mL Intravenous Q12H   [MAR Hold] sodium chloride flush  3 mL Intravenous Q12H   [MAR Hold] thiamine  100 mg Oral Daily   Or   [MAR Hold] thiamine  100 mg Intravenous Daily    Continuous Infusions:  sodium chloride     sodium chloride 10 mL/hr at 05/15/21 2326   heparin 1,050 Units/hr (05/16/21 0627)   [MAR Hold] magnesium sulfate bolus IVPB       LOS: 2 days     Darlin Drop, MD [facility] Pager [phone]  If 7PM-7AM, please contact night-coverage [email or web address] Password [facility] 05/16/2021, 1:30 PM

## 2021-05-16 NOTE — Progress Notes (Signed)
ANTICOAGULATION CONSULT NOTE - follow up ? ?Pharmacy Consult for heparin ?Indication:  LV Thrombus ? ?No Known Allergies ? ?Patient Measurements: ?Height: 5\' 6"  (167.6 cm) ?Weight: 69.4 kg (153 lb) ?IBW/kg (Calculated) : 63.8 ?Heparin Dosing Weight: 69 kg ? ?Vital Signs: ?Temp: 98.5 ?F (36.9 ?C) (03/13 0426) ?Temp Source: Oral (03/13 0426) ?BP: 167/121 (03/13 1525) ?Pulse Rate: 91 (03/13 1525) ? ?Labs: ?Recent Labs  ?  05/13/21 ?1900 05/13/21 ?2202 05/13/21 ?2358 05/14/21 ?0416 05/14/21 ?1918 05/15/21 ?0148 05/16/21 ?[id number] 05/16/21 ?0436  ?HGB  --   --   --  16.5  --  13.3 14.0  --   ?HCT  --   --   --  50.5  --  41.0 41.9  --   ?PLT  --   --   --  300  --  264 274  --   ?HEPARINUNFRC  --   --   --   --    < > 0.53 >1.10* >1.10*  ?CREATININE  --   --  1.45* 1.21  --  0.98 0.98  --   ?TROPONINIHS 1,397* 1,085* 1,110*  --   --   --   --   --   ? < > = values in this interval not displayed.  ? ? ?Estimated Creatinine Clearance: 92.2 mL/min (by C-G formula based on SCr of 0.98 mg/dL). ? ? ?Medical History: ?Past Medical History:  ?Diagnosis Date  ? Diabetes mellitus ([place])   ? Hypertension   ? Metacarpal bone fracture 07/11/2015  ? right small  ? Neuropathy   ? Seizure with provoking factor ([place])   ? 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 ? ?Medications: No anticoagulants PTA ? ?Assessment: ?Patient is a 43 yoM with PMH of T2DM, GIB, polysubstance abuse (ETOH, cocaine, opiates). Presented after syncopal episode found to have LV thrombus. Pharmacy consulted to restart heparin drip 4 hours after sheath pull. ? ?Sheath pulled at 13:41. No signs of hematoma per notes. Heparin 3000 units given in cath. Previously supratherapeutic on 1300 units/hr. ? ?Goal of Therapy:  ?Heparin level 0.3-0.7 units/ml ?Monitor platelets by anticoagulation protocol: Yes ?  ?Plan:   ?Resume heparin at 1050 units/hr at 18:00, no bolus ?Monitor daily heparin level, CBC ?Monitor for signs/symptoms of bleeding  ? ?Benetta Spar, PharmD, BCPS,  BCCP ?Clinical Pharmacist ? ?Please check AMION for all [place] phone numbers ?After 10:00 PM, call [place] [phone] ? ?

## 2021-05-16 NOTE — Progress Notes (Signed)
9fr sheath aspirated and removed from right femoral vein. Manual pressure applied for 15 minutes. Site level ) no s+s of hematoma. Tegaderm dressing applied, bedrest instructions given. ? ? ?Right dp and pt palpable.  ? ? ?Bedrest for groin site begins at 14:50:00 ?

## 2021-05-17 ENCOUNTER — Other Ambulatory Visit (HOSPITAL_COMMUNITY): Payer: Self-pay

## 2021-05-17 ENCOUNTER — Ambulatory Visit (INDEPENDENT_AMBULATORY_CARE_PROVIDER_SITE_OTHER): Payer: Self-pay | Admitting: Primary Care

## 2021-05-17 LAB — CBC
HCT: 40.9 % (ref 39.0–52.0)
Hemoglobin: 13.6 g/dL (ref 13.0–17.0)
MCH: 27.5 pg (ref 26.0–34.0)
MCHC: 33.3 g/dL (ref 30.0–36.0)
MCV: 82.8 fL (ref 80.0–100.0)
Platelets: 263 10*3/uL (ref 150–400)
RBC: 4.94 MIL/uL (ref 4.22–5.81)
RDW: 13.7 % (ref 11.5–15.5)
WBC: 7.6 10*3/uL (ref 4.0–10.5)
nRBC: 0 % (ref 0.0–0.2)

## 2021-05-17 LAB — GLUCOSE, CAPILLARY
Glucose-Capillary: 157 mg/dL — ABNORMAL HIGH (ref 70–99)
Glucose-Capillary: 167 mg/dL — ABNORMAL HIGH (ref 70–99)

## 2021-05-17 LAB — BASIC METABOLIC PANEL
Anion gap: 8 (ref 5–15)
BUN: 17 mg/dL (ref 6–20)
CO2: 25 mmol/L (ref 22–32)
Calcium: 8.4 mg/dL — ABNORMAL LOW (ref 8.9–10.3)
Chloride: 102 mmol/L (ref 98–111)
Creatinine, Ser: 1.14 mg/dL (ref 0.61–1.24)
GFR, Estimated: >60 mL/min (ref >60)
Glucose, Bld: 142 mg/dL — ABNORMAL HIGH (ref 70–99)
Potassium: 4.2 mmol/L (ref 3.5–5.1)
Sodium: 135 mmol/L (ref 135–145)

## 2021-05-17 LAB — HEPARIN LEVEL (UNFRACTIONATED): Heparin Unfractionated: 0.47 IU/mL (ref 0.30–0.70)

## 2021-05-17 LAB — MAGNESIUM: Magnesium: 2.2 mg/dL (ref 1.7–2.4)

## 2021-05-17 LAB — TSH: TSH: 0.926 u[IU]/mL (ref 0.350–4.500)

## 2021-05-17 MED ORDER — CARVEDILOL 3.125 MG PO TABS
3.1250 mg | ORAL_TABLET | Freq: Two times a day (BID) | ORAL | 0 refills | Status: DC
Start: 2021-05-17 — End: 2021-05-24
  Filled 2021-05-17: qty 180, 90d supply, fill #0

## 2021-05-17 MED ORDER — APIXABAN (ELIQUIS) VTE STARTER PACK (10MG AND 5MG)
ORAL_TABLET | ORAL | 0 refills | Status: DC
  Filled 2021-05-17: qty 74, 30d supply, fill #0

## 2021-05-17 MED ORDER — THIAMINE HCL 100 MG PO TABS
100.0000 mg | ORAL_TABLET | Freq: Every day | ORAL | 0 refills | Status: AC
  Filled 2021-05-17: qty 90, 90d supply, fill #0

## 2021-05-17 MED ORDER — LOSARTAN POTASSIUM 25 MG PO TABS: 25.0000 mg | ORAL_TABLET | Freq: Every day | ORAL | Status: DC

## 2021-05-17 MED ORDER — FOLIC ACID 1 MG PO TABS
1.0000 mg | ORAL_TABLET | Freq: Every day | ORAL | 0 refills | Status: AC
  Filled 2021-05-17: qty 90, 90d supply, fill #0

## 2021-05-17 MED ORDER — MUPIROCIN 2 % EX OINT
1.0000 "application " | TOPICAL_OINTMENT | Freq: Two times a day (BID) | CUTANEOUS | 0 refills | Status: DC
  Filled 2021-05-17: qty 22, 11d supply, fill #0

## 2021-05-17 MED ORDER — ADULT MULTIVITAMIN W/MINERALS CH
1.0000 | ORAL_TABLET | Freq: Every day | ORAL | 0 refills | Status: DC
Start: 2021-05-17 — End: 2021-07-06
  Filled 2021-05-17: qty 90, 90d supply, fill #0

## 2021-05-17 MED ORDER — APIXABAN 5 MG PO TABS
10.0000 mg | ORAL_TABLET | Freq: Two times a day (BID) | ORAL | Status: DC
  Administered 2021-05-17: 10 mg via ORAL
  Filled 2021-05-17: qty 2

## 2021-05-17 MED ORDER — LOSARTAN POTASSIUM 25 MG PO TABS
25.0000 mg | ORAL_TABLET | Freq: Every day | ORAL | 0 refills | Status: DC
  Filled 2021-05-17: qty 90, 90d supply, fill #0

## 2021-05-17 MED ORDER — APIXABAN 5 MG PO TABS
5.0000 mg | ORAL_TABLET | Freq: Two times a day (BID) | ORAL | 0 refills | Status: DC
Start: 2021-05-17 — End: 2021-06-16
  Filled 2021-05-17: qty 120, 60d supply, fill #0

## 2021-05-17 MED ORDER — APIXABAN 5 MG PO TABS: 5.0000 mg | ORAL_TABLET | Freq: Two times a day (BID) | ORAL | Status: DC

## 2021-05-17 MED ORDER — PANTOPRAZOLE SODIUM 40 MG PO TBEC
40.0000 mg | DELAYED_RELEASE_TABLET | Freq: Every day | ORAL | 0 refills | Status: DC
  Filled 2021-05-17: qty 30, 30d supply, fill #0

## 2021-05-17 MED ORDER — BLOOD GLUCOSE MONITOR KIT
PACK | 0 refills | Status: DC
Start: 2021-05-17 — End: 2024-01-22
  Filled 2021-05-17: qty 1, fill #0

## 2021-05-17 MED ORDER — METFORMIN HCL 500 MG PO TABS
500.0000 mg | ORAL_TABLET | Freq: Two times a day (BID) | ORAL | 0 refills | Status: DC
  Filled 2021-05-17: qty 180, 90d supply, fill #0

## 2021-05-17 MED ORDER — POLYETHYLENE GLYCOL 3350 17 G PO PACK
17.0000 g | PACK | Freq: Every day | ORAL | 0 refills | Status: DC | PRN
  Filled 2021-05-17: qty 14, 14d supply, fill #0

## 2021-05-17 NOTE — Discharge Summary (Addendum)
?Discharge Summary ? ?JANZEN SACKS RKY:[id number] DOB: May 28, 1980 ? ?PCP: Kerin Perna, NP ? ?Admit date: 05/13/2021 ?Discharge date: 05/17/2021 ? ?Time spent: 35 minutes. ? ?Recommendations for Outpatient Follow-up:  ?Follow-up with cardiology within a week. ?Follow-up with your primary care provider in 1 to 2 weeks. ?Take your medications as prescribed. ?Completely abstain from polysubstance use, tobacco, or alcohol use. ?Weight yourself daily and document your weight. ?Do not drive until cleared by cardiology. ?Do not have contact with cocaine, contact may induce a fatal cardiac event.   ? ?Discharge Diagnoses:  ?Active Hospital Problems  ? Diagnosis Date Noted  ? Hypokalemia 05/13/2021  ? Acute systolic heart failure ([place])   ? Hypomagnesemia 05/13/2021  ? Hypocalcemia 05/13/2021  ? Troponin level elevated 05/13/2021  ? Polysubstance abuse ([place]) 08/16/2020  ? AKI (acute kidney injury) ([place]) 08/15/2020  ? Essential hypertension 04/05/2016  ? Type 2 diabetes mellitus without complication, with long-term current use of insulin ([place])   ?  ?Resolved Hospital Problems  ?No resolved problems to display.  ? ? ?Discharge Condition: Stable ? ?Diet recommendation: Heart healthy diet. ? ?Vitals:  ? 05/16/21 2125 05/17/21 0425  ?BP: (!) 111/93 116/85  ?Pulse: 86 94  ?Resp: 18 19  ?Temp: 98.6 ?F (37 ?C) 98.2 ?F (36.8 ?C)  ?SpO2: 100% 98%  ? ? ?History of present illness:  ?David [name] is a 41 y.o. male with medical history significant of GI bleed, LA Grade C reflux esophagitis with no bleeding, acute gastritis, type 2 diabetes, polysubstance abuse, alcohol use disorder, hypertension, who presented after an episode of syncope and collapse, he hit his head on the ground.  Reports ongoing issues with lightheadedness after starting clonidine.   ?  ?Upon presentation to the ED, work-up revealed elevated troponin, high-sensitivity troponin peaked at greater than 1300, electrolyte disturbances, UDS positive for cocaine  and opiates.  Seen by cardiology, had a 2D echo which revealed severely reduced LVEF and small left ventricular thrombus.  Started on heparin drip and had a heart cath on 05/16/2021, diagnosed with nonischemic cardiomyopathy. ?  ?05/17/2021:  Patient was seen at his bedside.  His wife was present in the room.  He has no new complaints.  Denies any anginal symptoms.  No bleeding. ? ?Hospital Course:  ?Principal Problem: ?  Hypokalemia ?Active Problems: ?  Type 2 diabetes mellitus without complication, with long-term current use of insulin ([place]) ?  Essential hypertension ?  AKI (acute kidney injury) ([place]) ?  Polysubstance abuse ([place]) ?  Hypomagnesemia ?  Hypocalcemia ?  Troponin level elevated ?  Acute systolic heart failure ([place]) ? ?Elevated troponin secondary to newly diagnosed nonischemic cardiomyopathy. ?Newly diagnosed known ischemic cardiomyopathy. ?Denies any anginal symptoms ?High-sensitivity troponin peaked at greater than 1300 with abnormal ST-T changes. ?2D echo showing severely reduced LVEF 25 to 30% with global hypokinesis, LV thrombus.   ?Post left and right heart cath on 05/16/2021, diagnosed as nonischemic cardiomyopathy.  Per cardiology no need for antiplatelet on DOAC. ?Received heparin drip and switched to Eliquis on 05/17/2021. ?Outpatient follow-up with cardiology. ?  ?Newly diagnosed combined systolic and diastolic CHF ?2D echo 05/19/1759 showed LVEF 25 to 60%, grade 1 diastolic dysfunction, global hypokinesis, small LV thrombus. ?Coreg 3.125 mg twice daily, losartan 25 mg daily started by cardiology. ?LDL 52, A1C 6.7 on 05/15/21 ?Per cardiology no statin needed with normal coronaries. ?Continue strict I's and O's and daily weight ?Guideline directed medical therapy per cardiology. ?  ?Newly diagnosed left ventricular thrombus ?Started  on heparin drip on 05/14/2021, switched to Eliquis on 05/17/2021. ?Due to prior history of GI bleed added PPI, Protonix daily. ? ?Syncope, suspect cardiogenic ?Negative  orthostatic vital signs from 05/14/2021 ?Management per cardiology ? ?QTc prolongation ?QTc 520 from 12-lead EKG this a.m. ?Optimize potassium and magnesium level ?On the day of discharge potassium 4.2, magnesium 2.2. ?Avoid QTc prolonging agents ?Do not drive until cleared by cardiology ?Patient denies cocaine use and says he handles it so absorbs it in his skin.   ?Any contact with cocaine may induce a fatal cardiac event.   ?  ?Type 2 diabetes with hyperglycemia ?Hemoglobin A1c 6.7  ?Metformin 500 mg twice daily ?Follow-up with your primary care provider. ?  ?History of GI bleed, LA grade C reflux esophagitis, acute gastritis ?Received IV Protonix 40 mg twice daily while on heparin drip. ?No overt bleeding.  Protonix switched to p.o. 40 mg daily while on Eliquis. ?Follow-up with your primary care provider. ?  ?Resolved leukocytosis, likely reactive in the setting of LV thrombus. ?Nonseptic appearing, afebrile with no leukocytosis. ?No evidence of active infective process. ?  ?UDS positive for cocaine ?Patient denies use of cocaine but states he handles it with his hand. ?Patient denies cocaine use and says he handles it so absorbs it in his skin.   ?Any contact with cocaine may induce a fatal cardiac event.   ?Avoid contact with cocaine ?  ?Alcohol use disorder ?No evidence of alcohol withdrawal at the time of this visit. ?Continue multivitamin, thiamine and folic acid supplement. ?  ?  ?  ?  ?  ?Code Status: Full code ?  ?Family Communication: Wife at bedside. ?  ?Consultants: ?Cardiology ?  ?Procedures: ?2D echo 05/14/2021 ?Left and right heart cath on 05/16/2021. ?  ?Antimicrobials: ?None ?  ?DVT prophylaxis: ?Heparin drip off on 05/17/2021, Eliquis started. ?  ? ? ?Discharge Exam: ?BP 116/85 (BP Location: Left Arm)   Pulse 94   Temp 98.2 ?F (36.8 ?C) (Oral)   Resp 19   Ht _0  (1.676 m)   Wt 68.8 kg   SpO2 98%   BMI 24.48 kg/m?  ?General: 41 y.o. year-old male well developed well nourished in no acute  distress.  Alert and oriented x3. ?Cardiovascular: Regular rate and rhythm with no rubs or gallops.  No thyromegaly or JVD noted.   ?Respiratory: Clear to auscultation with no wheezes or rales. Good inspiratory effort. ?Abdomen: Soft nontender nondistended with normal bowel sounds x4 quadrants. ?Musculoskeletal: No lower extremity edema bilaterally. ?Skin: No ulcerative lesions noted or rashes, ?Psychiatry: Mood is appropriate for condition and setting ? ?Discharge Instructions ?You were cared for by a hospitalist during your hospital stay. If you have any questions about your discharge medications or the care you received while you were in the hospital after you are discharged, you can call the unit and asked to speak with the hospitalist on call if the hospitalist that took care of you is not available. Once you are discharged, your primary care physician will handle any further medical issues. Please note that NO REFILLS for any discharge medications will be authorized once you are discharged, as it is imperative that you return to your primary care physician (or establish a relationship with a primary care physician if you do not have one) for your aftercare needs so that they can reassess your need for medications and monitor your lab values. ? ? ?Allergies as of 05/17/2021   ?No Known Allergies ?  ? ?  ?  Medication List  ?  ? ?STOP taking these medications   ? ?cloNIDine 0.2 mg/24hr patch ?Commonly known as: CATAPRES - Dosed in mg/24 hr ?  ?GOODY HEADACHE PO ?  ?ondansetron 4 MG disintegrating tablet ?Commonly known as: ZOFRAN-ODT ?  ? ?  ? ?TAKE these medications   ? ?apixaban 5 MG Tabs tablet ?Commonly known as: ELIQUIS ?Take 1 tablet (5 mg total) by mouth 2 (two) times daily. ?  ?Apixaban Starter Pack (38m and 546m ?Commonly known as: ELIQUIS STARTER PACK ?Take as directed on package: start with two-4m36mablets twice daily for 7 days. On day 8, switch to one-4mg49mblet twice daily. ?Start taking on: June 15, 2021 ?  ?blood glucose meter kit and supplies Kit ?Dispense based on patient and insurance preference. Use up to four times daily as directed. ?  ?carvedilol 3.125 MG tablet ?Commonly known as: COREG ?Take 1 tab

## 2021-05-17 NOTE — Progress Notes (Addendum)
? ?Progress Note ? ?Patient Name: David [name] ?Date of Encounter: 05/17/2021 ? ?Primary Cardiologist: Jodelle Red, MD ? ?Subjective  ? ?Denies complaints, no CP or SOB, no bleeding at cath sites. ?Reports that at discharge he will need a note from the hospital because he is on probation. I asked him to discuss with the primary team. ? ?Inpatient Medications  ?  ?Scheduled Meds: ? carvedilol  3.125 mg Oral BID WC  ? Chlorhexidine Gluconate Cloth  6 each Topical Q0600  ? folic acid  1 mg Oral Daily  ? insulin aspart  0-5 Units Subcutaneous QHS  ? insulin aspart  0-9 Units Subcutaneous TID WC  ? multivitamin with minerals  1 tablet Oral Daily  ? mupirocin ointment  1 application. Nasal BID  ? pantoprazole  40 mg Oral Daily  ? sodium chloride flush  3 mL Intravenous Q12H  ? sodium chloride flush  3 mL Intravenous Q12H  ? sodium chloride flush  3 mL Intravenous Q12H  ? thiamine  100 mg Oral Daily  ? Or  ? thiamine  100 mg Intravenous Daily  ? ?Continuous Infusions: ? sodium chloride    ? heparin 1,050 Units/hr (05/16/21 1812)  ? ?PRN Meds: ?sodium chloride, acetaminophen **OR** acetaminophen, acetaminophen, hydrALAZINE, LORazepam **OR** LORazepam, ondansetron (ZOFRAN) IV, polyethylene glycol, sodium chloride flush  ? ?Vital Signs  ?  ?Vitals:  ? 05/16/21 1605 05/16/21 2125 05/17/21 0425 05/17/21 0430  ?BP: (!) 169/120 (!) 111/93 116/85   ?Pulse: 76 86 94   ?Resp: 13 18 19    ?Temp:  98.6 ?F (37 ?C) 98.2 ?F (36.8 ?C)   ?TempSrc:  Oral Oral   ?SpO2: 100% 100% 98%   ?Weight:    68.8 kg  ?Height:      ? ? ?Intake/Output Summary (Last 24 hours) at 05/17/2021 0903 ?Last data filed at 05/17/2021 0700 ?Gross per 24 hour  ?Intake 733.65 ml  ?Output 600 ml  ?Net 133.65 ml  ? ?Last 3 Weights 05/17/2021 05/16/2021 05/15/2021  ?Weight (lbs) 151 lb 10.8 oz 153 lb 152 lb 12.5 oz  ?Weight (kg) 68.8 kg 69.4 kg 69.3 kg  ?  ? ?Telemetry  ?  ?NSR - Personally Reviewed ? ?ECG  ?  ?NSR 88bpm, LVH with diffuse TW changes - TWI I, II,  avL, V3-V6 with biphasic appearance in V2 - Personally Reviewed ? ?Physical Exam  ? ?GEN: No acute distress.  ?HEENT: Normocephalic, atraumatic, sclera non-icteric. ?Neck: No JVD or bruits. ?Cardiac: RRR no murmurs, rubs, or gallops.  ?Respiratory: Clear to auscultation bilaterally. Breathing is unlabored. ?GI: Soft, nontender, non-distended, BS +x 4. ?MS: no deformity. ?Extremities: No clubbing or cyanosis. No edema. Distal pedal pulses are 2+ and equal bilaterally. Right groin cath site without hematoma, ecchymosis, or bruit. Right radial cath site without hematoma or ecchymosis; good pulse. ?Neuro:  AAOx3. Follows commands. ?Psych:  Responds to questions appropriately with a normal affect. ? ?Labs  ?  ?High Sensitivity Troponin:   ?Recent Labs  ?Lab 05/13/21 ?1652 05/13/21 ?1900 05/13/21 ?2202 05/13/21 ?2358  ?TROPONINIHS 1,237* 2359* 1,085* 1,110*  ?   ? ?Cardiac EnzymesNo results for input(s): TROPONINI in the last 168 hours. No results for input(s): TROPIPOC in the last 168 hours.  ? ?Chemistry ?Recent Labs  ?Lab 05/13/21 ?1652 05/13/21 ?2358 05/14/21 ?0416 05/15/21 ?0148 05/16/21 ?05/18/21 05/16/21 ?1317 05/16/21 ?1329 05/17/21 ?0515  ?NA 137   < > 140 135 136 138 138  138 135  ?K 2.8*   < > 4.4  3.8 3.7 3.9 4.1  4.1 4.2  ?CL 109   < > 97* 105 101  --   --  102  ?CO2 21*   < > 24 24 27   --   --  25  ?GLUCOSE 170*   < > 87 115* 91  --   --  142*  ?BUN 29*   < > 30* 21* 13  --   --  17  ?CREATININE 1.62*   < > 1.21 0.98 0.98  --   --  1.14  ?CALCIUM 6.5*   < > 9.2 8.4* 8.6*  --   --  8.4*  ?PROT 5.3*  --  6.5 6.2*  --   --   --   --   ?ALBUMIN 2.6*  --  3.2* 3.1*  --   --   --   --   ?AST 23  --  46* 18  --   --   --   --   ?ALT 11  --  19 14  --   --   --   --   ?ALKPHOS 32*  --  40 35*  --   --   --   --   ?BILITOT 1.0  --  1.2 0.4  --   --   --   --   ?GFRNONAA 55*   < > >60 >60 >60  --   --  >60  ?ANIONGAP 7   < > 19* 6 8  --   --  8  ? < > = values in this interval not displayed.  ?  ? ?Hematology ?Recent  Labs  ?Lab 05/15/21 ?0148 05/16/21 ?05/18/21 05/16/21 ?1317 05/16/21 ?1329 05/17/21 ?0515  ?WBC 8.3 9.4  --   --  7.6  ?RBC 4.89 5.05  --   --  4.94  ?HGB 13.3 14.0 13.9 14.6  14.6 13.6  ?HCT 41.0 41.9 41.0 43.0  43.0 40.9  ?MCV 83.8 83.0  --   --  82.8  ?MCH 27.2 27.7  --   --  27.5  ?MCHC 32.4 33.4  --   --  33.3  ?RDW 13.9 13.8  --   --  13.7  ?PLT 264 274  --   --  263  ? ? ?BNPNo results for input(s): BNP, PROBNP in the last 168 hours.  ? ?DDimer No results for input(s): DDIMER in the last 168 hours.  ? ?Radiology  ?  ?CARDIAC CATHETERIZATION ? ?Result Date: 05/16/2021 ?  LV end diastolic pressure is normal.   There is no aortic valve stenosis.   Ao sat 97%, PA sat 70%, mean PA pressure 13 mm Hg, mean PCWP 6 mm Hg; CO 4.6 L/min; CI 2.59.   No angiographically apparent coronary artery disease.   Mild, right subclavian tortuosity. Nonischemic cardiomyopathy.  Continue medical therapy.  We spoke about lifestyle changes including smoking cessation.  Restart heparin IV in 4 hours.  Okay to start oral anticoagulant tomorrow.   ? ?Cardiac Studies  ? ?2d echo 05/14/21 ? 1. Global hypokinesis, but mid to apical segments worse than base.  ?Consider takotsubo cardiomyopathy, though CAD cannot be excluded. Small LV  ?thrombus noted at apex, 0.98 x 0.75 cm. Left ventricular ejection  ?fraction, by estimation, is 25 to 30%. The  ?left ventricle has severely decreased function. The left ventricle  ?demonstrates global hypokinesis. Left ventricular diastolic parameters are  ?consistent with Grade I diastolic dysfunction (impaired relaxation).  ? 2. Right ventricular systolic function  is normal. The right ventricular  ?size is normal. Tricuspid regurgitation signal is inadequate for assessing  ?PA pressure.  ? 3. The mitral valve is normal in structure. Trivial mitral valve  ?regurgitation. No evidence of mitral stenosis.  ? 4. The aortic valve is grossly normal. Aortic valve regurgitation is not  ?visualized. No aortic stenosis is  present.  ? 5. The inferior vena cava is normal in size with greater than 50%  ?respiratory variability, suggesting right atrial pressure of 3 mmHg.  ? ?Comparison(s): No prior Echocardiogram.  ? ?Conclusion(s)/Recommendation(s): Severely reduced LVEF, small LV thrombus.  ?Findings discussed with patient.  ? ?Patient Profile  ?   ?41 y.o. male with hx of substance abuse (ETOH abuse, cocaine), GI bleed/gastritis, type II diabetes on insulin who presented to the hospital with several day history of nausea and vomiting followed by episode of syncope. Initial labs showed AKI, hypokalemia, hypocalcemia with hypoalbuminemia, hemoconcentrated Hgb, elevated troponin. UDS + cocaine, opiates. ? ?Assessment & Plan  ?  ?1. Elevated troponin/newly recognized NICM/acute combined CHF ?- hsTroponins high but flat - [phone] on admission  ?- echo 05/14/21 with global HK EF 25-30% ?- cath 05/16/21 with no CAD, normal LVEDP -> CM is nonischemic ?- currently treated with carvedilol (suspect chosen in lieu of metoprolol due to cocaine use) - will review advancement of regimen with MD. I suspect it may be best to start with generics and follow-up in the [facility] HF clinic as OP to discuss furthering his regimen where resources are in place ?- reviewed 2g sodium restriction, 2L fluid restriction, daily weights with patient. ?- volume status looks normal ? ?2. Syncope ?- given clinical hx and electrolyte abnormalities, this was suspected due to dehydration ?- continues to have QT prolongation - on EKG this AM - will review with MD whether we need to have EP see prior to dc or on follow-up, as well as whether event monitoring/driving restrictions are needed ?  ?3. LV thrombus ?- started on IV heparin -> IM has placed order to transition to apixaban per pharmacy today ?- care management consult requested due to lack of insurance (does appear they gave him a coupon for Eliquis already) ?  ?4. Substance abuse ?- UDS + cocaine,  opiates, per PMH drinks minimum of 4 shots of liquor daily ?- complete abstinence of harmful substances will be vital for prognosis as well as safety with medications - discussed with patient ?  ?5. AKI with ele

## 2021-05-17 NOTE — Progress Notes (Signed)
HF TOC f/u arranged 3/21 and placed on AVS. ?

## 2021-05-17 NOTE — Progress Notes (Signed)
ANTICOAGULATION CONSULT NOTE - follow up ? ?Pharmacy Consult for heparin ?Indication:  LV Thrombus ? ?No Known Allergies ? ?Patient Measurements: ?Height: 5\' 6"  (167.6 cm) ?Weight: 68.8 kg (151 lb 10.8 oz) ?IBW/kg (Calculated) : 63.8 ?Heparin Dosing Weight: 69 kg ? ?Vital Signs: ?Temp: 98.2 ?F (36.8 ?C) (03/14 0425) ?Temp Source: Oral (03/14 0425) ?BP: 116/85 (03/14 0425) ?Pulse Rate: 94 (03/14 0425) ? ?Labs: ?Recent Labs  ?  05/15/21 ?0148 05/16/21 ?05/18/21 05/16/21 ?0436 05/16/21 ?1317 05/16/21 ?1329 05/17/21 ?0515  ?HGB 13.3 14.0  --  13.9 14.6  14.6 13.6  ?HCT 41.0 41.9  --  41.0 43.0  43.0 40.9  ?PLT 264 274  --   --   --  263  ?HEPARINUNFRC 0.53 >1.10* >1.10*  --   --  0.47  ?CREATININE 0.98 0.98  --   --   --  1.14  ? ? ? ?Estimated Creatinine Clearance: 79.3 mL/min (by C-G formula based on SCr of 1.14 mg/dL). ? ? ?Medical History: ?Past Medical History:  ?Diagnosis Date  ? Diabetes mellitus (HCC)   ? Hypertension   ? Metacarpal bone fracture 07/11/2015  ? right small  ? Neuropathy   ? Seizure with provoking factor (HCC)   ? 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 ? ?Medications: No anticoagulants PTA ? ?Assessment: ?Patient is a 8 yoM with PMH of T2DM, GIB, polysubstance abuse (ETOH, cocaine, opiates). Presented after syncopal episode found to have LV thrombus. Pharmacy consulted to restart heparin drip 4 hours after sheath pull. ? ?Sheath pulled at 13:41. No signs of hematoma per notes. Heparin 3000 units given in cath. Previously supratherapeutic on 1300 units/hr. ? ?05/17/2021: ?Heparin level 0.47- therapeutic on heparin 1050 units/hr ?CBC WNL ?No bleeding or infusion related issues per RN ? ?Goal of Therapy:  ?Heparin level 0.3-0.7 units/ml ?Monitor platelets by anticoagulation protocol: Yes ?  ?Plan:   ?Continue heparin at 1050 units/hr  ?Check confirmatory level at 12n ?Monitor daily heparin level, CBC ?Monitor for signs/symptoms of bleeding  ?F/U plans to resume Xarelto ? ?05/19/2021 PharmD,  BCPS ?Clinical Pharmacist ? ? ?

## 2021-05-18 ENCOUNTER — Telehealth: Payer: Self-pay

## 2021-05-18 ENCOUNTER — Telehealth (INDEPENDENT_AMBULATORY_CARE_PROVIDER_SITE_OTHER): Payer: Self-pay | Admitting: Primary Care

## 2021-05-18 NOTE — Telephone Encounter (Signed)
Transition Care Management Unsuccessful Follow-up Telephone Call ? ?Date of discharge and from where:  05/17/2021, [facility]  ? ?Attempts:  1st Attempt ? ?Reason for unsuccessful TCM follow-up call:  Unable to leave message - called # [phone]. Phone just rings, no option to leave a message. ? ?Need to discuss scheduling a hospital follow up appointment with PCP ? ?  ?

## 2021-05-18 NOTE — Telephone Encounter (Signed)
Copied from CRM [id number]. Topic: General - Inquiry >> May 18, 2021 10:33 AM Aretta Nip wrote: Reason for CRM: Pt was in [facility] yesterday with a slight heart attack per mother, why missed appt yesterday, ED wants to be seen asap fu as no HFU available on time frame [phone] Arline Asp , mother... call her

## 2021-05-18 NOTE — Telephone Encounter (Signed)
Transition Care Management Follow-up Telephone Call ? ?Call received from patient.  ?Date of discharge and from where: 05/17/2021, [facility]  ?How have you been since you were released from the hospital? He said he is feeling okay just somewhat depressed about the new diagnosis of cardiomyopathy. Explained to him that compliance with medications, lifestyle modifications, diet are all important factors for managing this diagnosis . Also informed him that [facility] has an LCSW available for him to speak to about his feelings of depression regarding this diagnosis.   ?Any questions or concerns? Yes - noted above. ?He needs a new glucometer  ? ?Items Reviewed: ?Did the pt receive and understand the discharge instructions provided? Yes  ?Medications obtained and verified? Yes - he said he has all medications. The only question he had was regarding the clonidine patch.  He wanted to confirm that he is to stop using it and per the AVS, he is to stop using it.  ?Other? No  ?Any new allergies since your discharge? No  ?Dietary orders reviewed? Yes ?Do you have support at home? Yes - his wife and mother.  ? ?Home Care and Equipment/Supplies: ?Were home health services ordered? no ?If so, what is the name of the agency? N/a  ?Has the agency set up a time to come to the patient's home? not applicable ?Were any new equipment or medical supplies ordered?  No ?What is the name of the medical supply agency? N/a ?Were you able to get the supplies/equipment? not applicable ?Do you have any questions related to the use of the equipment or supplies? No ? ?Functional Questionnaire: (I = Independent and D = Dependent) ?ADLs: independent - his wife and mother help with medication management and insulin administration if needed ? ? ?Follow up appointments reviewed: ? ?PCP Hospital f/u appt confirmed? Yes  Scheduled to see Gwinda Passe, NP - 05/26/2021.  ?Specialist Hospital f/u appt confirmed? Yes  Scheduled to see cardiology -  05/24/2021.  ?Are transportation arrangements needed? No  ?If their condition worsens, is the pt aware to call PCP or go to the Emergency Dept.? Yes ?Was the patient provided with contact information for the PCP's office or ED? Yes ?Was to pt encouraged to call back with questions or concerns? Yes ? ?

## 2021-05-24 ENCOUNTER — Telehealth (HOSPITAL_COMMUNITY): Payer: Self-pay | Admitting: Pharmacy Technician

## 2021-05-24 ENCOUNTER — Other Ambulatory Visit: Payer: Self-pay

## 2021-05-24 ENCOUNTER — Other Ambulatory Visit (HOSPITAL_COMMUNITY): Payer: Self-pay

## 2021-05-24 ENCOUNTER — Ambulatory Visit (HOSPITAL_COMMUNITY)
Admit: 2021-05-24 | Discharge: 2021-05-24 | Disposition: A | Discharge status: Home / Self Care | Payer: Medicaid Other | Attending: Adult Health | Admitting: Adult Health

## 2021-05-24 ENCOUNTER — Telehealth (HOSPITAL_COMMUNITY): Payer: Self-pay | Admitting: *Deleted

## 2021-05-24 ENCOUNTER — Telehealth (HOSPITAL_COMMUNITY): Payer: Self-pay

## 2021-05-24 ENCOUNTER — Encounter (HOSPITAL_COMMUNITY): Payer: Self-pay

## 2021-05-24 VITALS — BP 130/80 | HR 81 | Wt 159.0 lb

## 2021-05-24 DIAGNOSIS — I82890 Acute embolism and thrombosis of other specified veins: Secondary | ICD-10-CM | POA: Insufficient documentation

## 2021-05-24 DIAGNOSIS — I5022 Chronic systolic (congestive) heart failure: Secondary | ICD-10-CM | POA: Diagnosis not present

## 2021-05-24 DIAGNOSIS — I110 Hypertensive heart disease with heart failure: Secondary | ICD-10-CM | POA: Diagnosis not present

## 2021-05-24 DIAGNOSIS — R9431 Abnormal electrocardiogram [ECG] [EKG]: Secondary | ICD-10-CM | POA: Insufficient documentation

## 2021-05-24 DIAGNOSIS — Z79899 Other long term (current) drug therapy: Secondary | ICD-10-CM | POA: Insufficient documentation

## 2021-05-24 DIAGNOSIS — Z8719 Personal history of other diseases of the digestive system: Secondary | ICD-10-CM | POA: Diagnosis not present

## 2021-05-24 DIAGNOSIS — I513 Intracardiac thrombosis, not elsewhere classified: Secondary | ICD-10-CM

## 2021-05-24 DIAGNOSIS — E876 Hypokalemia: Secondary | ICD-10-CM | POA: Insufficient documentation

## 2021-05-24 DIAGNOSIS — I5042 Chronic combined systolic (congestive) and diastolic (congestive) heart failure: Secondary | ICD-10-CM | POA: Insufficient documentation

## 2021-05-24 DIAGNOSIS — F1410 Cocaine abuse, uncomplicated: Secondary | ICD-10-CM | POA: Insufficient documentation

## 2021-05-24 DIAGNOSIS — Z7901 Long term (current) use of anticoagulants: Secondary | ICD-10-CM | POA: Insufficient documentation

## 2021-05-24 DIAGNOSIS — E1140 Type 2 diabetes mellitus with diabetic neuropathy, unspecified: Secondary | ICD-10-CM | POA: Insufficient documentation

## 2021-05-24 DIAGNOSIS — I428 Other cardiomyopathies: Secondary | ICD-10-CM | POA: Insufficient documentation

## 2021-05-24 LAB — CBC
HCT: 38.9 % — ABNORMAL LOW (ref 39.0–52.0)
Hemoglobin: 12.6 g/dL — ABNORMAL LOW (ref 13.0–17.0)
MCH: 27.5 pg (ref 26.0–34.0)
MCHC: 32.4 g/dL (ref 30.0–36.0)
MCV: 84.7 fL (ref 80.0–100.0)
Platelets: 344 10*3/uL (ref 150–400)
RBC: 4.59 MIL/uL (ref 4.22–5.81)
RDW: 14.2 % (ref 11.5–15.5)
WBC: 7.4 10*3/uL (ref 4.0–10.5)
nRBC: 0 % (ref 0.0–0.2)

## 2021-05-24 LAB — BASIC METABOLIC PANEL
Anion gap: 6 (ref 5–15)
BUN: 12 mg/dL (ref 6–20)
CO2: 27 mmol/L (ref 22–32)
Calcium: 9.1 mg/dL (ref 8.9–10.3)
Chloride: 105 mmol/L (ref 98–111)
Creatinine, Ser: 1.2 mg/dL (ref 0.61–1.24)
GFR, Estimated: >60 mL/min (ref >60)
Glucose, Bld: 114 mg/dL — ABNORMAL HIGH (ref 70–99)
Potassium: 4.1 mmol/L (ref 3.5–5.1)
Sodium: 138 mmol/L (ref 135–145)

## 2021-05-24 MED ORDER — EMPAGLIFLOZIN 10 MG PO TABS
10.0000 mg | ORAL_TABLET | Freq: Every day | ORAL | 3 refills | Status: DC
  Filled 2021-05-24: qty 30, 30d supply, fill #0

## 2021-05-24 MED ORDER — CARVEDILOL 3.125 MG PO TABS
3.1250 mg | ORAL_TABLET | Freq: Two times a day (BID) | ORAL | 3 refills | Status: DC
Start: 2021-05-24 — End: 2021-10-31
  Filled 2021-05-24: qty 60, 30d supply, fill #0

## 2021-05-24 MED ORDER — SPIRONOLACTONE 25 MG PO TABS
12.5000 mg | ORAL_TABLET | Freq: Every day | ORAL | 3 refills | Status: DC
  Filled 2021-05-24: qty 15, 30d supply, fill #0

## 2021-05-24 MED ORDER — LOSARTAN POTASSIUM 25 MG PO TABS
25.0000 mg | ORAL_TABLET | Freq: Every day | ORAL | 3 refills | Status: DC
  Filled 2021-05-24: qty 30, 30d supply, fill #0

## 2021-05-24 NOTE — Progress Notes (Signed)
All HF meds sent to [facility] under HF Fund. Application for pt assistance for Eliquis signed by patient, will complete and have Dr Gala Romney sign provider section and send into BMS. ? ?Meredith Staggers, RN, BSN, CHFN ?Specialty Coordinator ?[facility] ? ?

## 2021-05-24 NOTE — Progress Notes (Signed)
? ? ?HEART & VASCULAR TRANSITION OF CARE CONSULT NOTE  ? ? ? ?Referring Physician: Dr Percival Spanish ?Primary Care: Juluis Mire NP  ?Primary Cardiologist: Dr Harrell Gave ? ?HPI: ?Referred to clinic by Dr Percival Spanish for heart failure consultation.  ? ?David [name] is a 41 year old with h/o  LV thrombus, GI bleed, gastritis, cocaine abuse, DMII, HTN, QQT prolongation, DMII, and HF systolic/diastolic hf.  ? ?Admitted 05/13/21 with N/V followed by syncope. On admit had AKI, hypokalemia, and hypocalcemia. UDS + cocaine and opitates. ECHO with EF 25-30%. Cath preserved cardiac output and normal cors. Started on GDMT. Started on DOAC for possible LV thrombus.  ? ?Overall feeling ok. Has has episodes of dyspnea at night. Occasionally short of breath walking.  Denies PND/Orthopnea. Appetite ok. No fever or chills. Weight at home 151-152 . He does not use cocaine but handles cocaine. He does not wear gloves or mask. Lives with his wife and 3 children.  Taking all medications.  ? ?Cardiac Testing  ?Echo 05/14/21 with global HK EF 25-30%, small LV thrombus. Possible Takotsubo ?Echo 08/2020 EF 50-55% RV normal  ? ?- LHC 05/16/21 with no CAD, normal LVEDP -> CM is nonischemic ? ?Review of Systems: [y] = yes, [ ] = no  ? ?General: Weight gain [ ]; Weight loss [ ]; Anorexia [ ]; Fatigue [ Y]; Fever [ ]; Chills [ ]; Weakness [ ]  ?Cardiac: Chest pain/pressure [ ]; Resting SOB [ ]; Exertional SOB [ Y]; Orthopnea [ ]; Pedal Edema [ ]; Palpitations [ ]; Syncope [ ]; Presyncope [ ]; Paroxysmal nocturnal dyspnea[ ]  ?Pulmonary: Cough [ ]; Wheezing[ ]; Hemoptysis[ ]; Sputum [ ]; Snoring [ ]  ?GI: Vomiting[ ]; Dysphagia[ ]; Melena[ ]; Hematochezia [ ]; Heartburn[ ]; Abdominal pain [ ]; Constipation [ ]; Diarrhea [ ]; BRBPR [ ]  ?GU: Hematuria[ ]; Dysuria [ ]; Nocturia[ ]  ?Vascular: Pain in legs with walking [ ]; Pain in feet with lying flat [ ]; Non-healing sores [ ]; Stroke [ ]; TIA [ ]; Slurred speech [ ];  ?Neuro: Headaches[ ]; Vertigo[ ];  Seizures[ ]; Paresthesias[ ];Blurred vision [ ]; Diplopia [ ]; Vision changes [ ]  ?Ortho/Skin: Arthritis [ ]; Joint pain [ Y]; Muscle pain [ ]; Joint swelling [ ]; Back Pain [ ]; Rash [ ]  ?Psych: Depression[ ]; Anxiety[ ]  ?Heme: Bleeding problems [ ]; Clotting disorders [ ]; Anemia [ ]  ?Endocrine: Diabetes [ Y]; Thyroid dysfunction[ ] ? ? ?Past Medical History:  ?Diagnosis Date  ? Diabetes mellitus ([place])   ? Hypertension   ? Metacarpal bone fracture 07/11/2015  ? right small  ? Neuropathy   ? Seizure with provoking factor ([place])   ? 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 ? ?Current Outpatient Medications  ?Medication Sig Dispense Refill  ? apixaban (ELIQUIS) 5 MG TABS tablet Take 1 tablet (5 mg total) by mouth 2 (two) times daily. 120 tablet 0  ? [START ON 06/15/2021] APIXABAN (ELIQUIS) VTE STARTER PACK (10MG AND 5MG) Take as directed on package: start with two-10m tablets twice daily for 7 days. On day 8, switch to one-538mtablet twice daily. 74 each 0  ? blood glucose meter kit and supplies KIT Dispense based on patient and insurance preference. Use up to four times daily as directed. 1 each 0  ? carvedilol (COREG) 3.125 MG tablet Take 1 tablet (3.125 mg total) by mouth 2 (two) times daily with a meal. 180  tablet 0  ? folic acid (FOLVITE) 1 MG tablet Take 1 tablet (1 mg total) by mouth daily. 90 tablet 0  ? insulin aspart (NOVOLOG) 100 UNIT/ML injection For blood sugars 0-150 give 0 units of insulin, 200-250 give 4 units of insulin, 251-300 give 6 units, 301-350 give 8 units, 351- 400 give 10 units, > 400 give 12 units and call M.D. ?Discussed hypoglycemia protocol. 10 mL PRN  ? Insulin Pen Needle (PEN NEEDLES 31GX5/16") 31G X 8 MM MISC As directed 100 each 1  ? losartan (COZAAR) 25 MG tablet Take 1 tablet (25 mg total) by mouth daily. 90 tablet 0  ? Multiple Vitamin (MULTIVITAMIN WITH MINERALS) TABS tablet Take 1 tablet by mouth daily. 90 tablet 0  ? mupirocin ointment (BACTROBAN) 2 % Place 1 application into  the nose 2 (two) times daily. 22 g 0  ? pantoprazole (PROTONIX) 40 MG tablet Take 1 tablet (40 mg total) by mouth daily. 30 tablet 0  ? polyethylene glycol (MIRALAX / GLYCOLAX) 17 g packet Take 17 g by mouth daily as needed for mild constipation. 14 each 0  ? thiamine 100 MG tablet Take 1 tablet (100 mg total) by mouth daily. 90 tablet 0  ? ?No current facility-administered medications for this encounter.  ? ? ?No Known Allergies ? ?  ?Social History  ? ?Socioeconomic History  ? Marital status: Married  ?  Spouse name: Not on file  ? Number of children: Not on file  ? Years of education: Not on file  ? Highest education level: Not on file  ?Occupational History  ? Not on file  ?Tobacco Use  ? Smoking status: Every Day  ?  Packs/day: 1.00  ?  Years: 10.00  ?  Pack years: 10.00  ?  Types: Cigarettes  ? Smokeless tobacco: Never  ?Vaping Use  ? Vaping Use: Never used  ?Substance and Sexual Activity  ? Alcohol use: Yes  ?  Alcohol/week: 30.0 standard drinks  ?  Types: 30 Shots of liquor per week  ?  Comment: minimum 4 shots of liquor daily  ? Drug use: Yes  ?  Types: Oxycodone, Marijuana  ? Sexual activity: Yes  ?  Birth control/protection: Condom  ?Other Topics Concern  ? Not on file  ?Social History Narrative  ? Not on file  ? ?Social Determinants of Health  ? ?Financial Resource Strain: Not on file  ?Food Insecurity: Not on file  ?Transportation Needs: Not on file  ?Physical Activity: Not on file  ?Stress: Not on file  ?Social Connections: Not on file  ?Intimate Partner Violence: Not on file  ? ? ?  ?Family History  ?Problem Relation Age of Onset  ? Diabetes Father   ? ? ?Vitals:  ? 05/24/21 1416  ?BP: 130/80  ?Pulse: 81  ?SpO2: 97%  ?Weight: 72.1 kg (159 lb)  ? ?Wt Readings from Last 3 Encounters:  ?05/24/21 72.1 kg (159 lb)  ?05/17/21 68.8 kg (151 lb 10.8 oz)  ?01/13/21 71.1 kg (156 lb 12.8 oz)  ? ? ?PHYSICAL EXAM: ?General: Walked in the clinic. No respiratory difficulty ?HEENT: normal ?Neck: supple. no JVD.  Carotids 2+ bilat; no bruits. No lymphadenopathy or thryomegaly appreciated. ?Cor: PMI nondisplaced. Regular rate & rhythm. No rubs, gallops or murmurs. ?Lungs: clear ?Abdomen: soft, nontender, nondistended. No hepatosplenomegaly. No bruits or masses. Good bowel sounds. ?Extremities: no cyanosis, clubbing, rash, edema ?Neuro: alert & oriented x 3, cranial nerves grossly intact. moves all 4 extremities w/o difficulty. Affect  pleasant. ? ?ECG: SR 76 bpm QT 450/ QTc 506 ms  ? ? ?ASSESSMENT & PLAN: ?1. HFrEF, NICM  ?05/14/2021 Echo EF 25-30% Small LV thrombus. He has not insurance therefore will not be able to further assess with CMRI. ?Cath- stable hemodynamics. Normal coronaries ?NYHA III. Volume status elevated. Add jardiance  ?-Continue low dose carvedilol ?- Continue current dose of losartan.  ?-Add 12.5 mg spironolactone daily.   ?-Add jardiance 10 mg daily -  ?- He will get spiro and jardiance through the HF fund.  ?- Check BMET  ?- Repeat ECHO in 3 months.  ? ?2. LV Thrombus ?On eliquis, continue  ? ?3. Cocaine Abuse ?- Discussed cessation.  ? ?4. DMII ?Hgb A1C 6.7  ?Has follow up with PCP tomorrow.  ?- Add jardiance.  ? ?5. Prolonged QT  ?QTc 506 ms.  ?Check BMET today  ? ?Discussed purpose of medications, testing, and next steps. Stressed importance of follow up. Anticipate check ECHO in 3 months after HF meds optimized.  ? ?Referred to HFSW ( Medications, Drug Abuse, Insurance, Financial ): Yes  ?Refer to Pharmacy:  No ?Refer to Home Health: No ?Refer to Advanced [facility] Failure Clinic: Yes Dr Haroldine Laws  ?Refer to General Cardiology: No ? ?Follow up in HF clinic in 2 weeks.  ? ?  ?5:36 PM ? ?

## 2021-05-24 NOTE — Telephone Encounter (Signed)
Heart Failure Nurse Navigator Progress Note  ? ?Attempted to call only number listed for appointment reminder, phone service turned off.  ? ?Rhae Hammock, BSN, RN ?Heart Failure Nurse Navigator ?[phone]  ?

## 2021-05-24 NOTE — Telephone Encounter (Signed)
Transitions of Care Pharmacy   Call attempted for a pharmacy transitions of care follow-up. Unable to leave voicemail.  Call attempt #1. Will follow-up in 2-3 days.    

## 2021-05-24 NOTE — Patient Instructions (Signed)
Start Jardiance 10 mg Daily ? ?Start Spironolactone 12.5 mg (1/2 tab) Daily ? ?Labs done today, we will call you for abnormal results ? ?Thank you for allowing [place] to provider your heart failure care after your recent hospitalization. Please follow-up with our Advanced [facility] Clinic in 2 weeks and again in 6 weeks ? ?Do the following things EVERYDAY: ?Weigh yourself in the morning before breakfast. Write it down and keep it in a log. ?Take your medicines as prescribed ?Eat low salt foods--Limit salt (sodium) to 2000 mg per day.  ?Stay as active as you can everyday ?Limit all fluids for the day to less than 2 liters ? ?If you have any questions or concerns before your next appointment please send [place] a message through [place] or call our office at [phone].   ? ?TO LEAVE A MESSAGE FOR THE NURSE SELECT OPTION 2, PLEASE LEAVE A MESSAGE INCLUDING: ?YOUR NAME ?DATE OF BIRTH ?CALL BACK NUMBER ?REASON FOR CALL**this is important as we prioritize the call backs ? ?YOU WILL RECEIVE A CALL BACK THE SAME DAY AS LONG AS YOU CALL BEFORE 4:00 PM ? ?At the [facility], you and your health needs are our priority. As part of our continuing mission to provide you with exceptional heart care, we have created designated Provider Care Teams. These Care Teams include your primary Cardiologist (physician) and Advanced Practice Providers (APPs- Physician Assistants and Nurse Practitioners) who all work together to provide you with the care you need, when you need it.  ? ?You may see any of the following providers on your designated Care Team at your next follow up: ?Dr Arvilla Meres ?Dr Marca Ancona ?Tonye Becket, NP ?Robbie Lis, PA ?Jessica Milford,NP ?Anna Genre, PA ?Karle Plumber, PharmD ? ? ?Please be sure to bring in all your medications bottles to every appointment.  ? ?

## 2021-05-24 NOTE — Telephone Encounter (Addendum)
Advanced Heart Failure Patient Advocate Encounter ? ?Sent in BMS application via fax.Document scanned to chart.  ? ?Will follow up. ? ?

## 2021-05-24 NOTE — Progress Notes (Signed)
?Heart and Vascular Care Navigation ? ?05/24/2021 ? ?David [name] ?[birth year] ?[id number] ? ?Reason for Referral: SDOH screen ?  ?Engaged with patient face to face for initial visit for Heart and Vascular Care Coordination. ?                                                                                                  ?Assessment:  CSW met with pt to complete standard SDOH screen.   ? ?Pt lives with wife and biological child (49monthold baby boy) as well as wife's two other minor children.  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   ? ?Pt interested in applying for disability- explained how pt can initiate application through social security.   ? ?Pt no yet screened by financial counseling for possible medicaid.  CSW sent message to counselors they have placed him on a list for screening. ? ?No insurance at this time- expressed financial strain with getting medications.  Pt will get meds through HF fund and CSW helped to initiate application for [employer]  assistance and provided to Patient Advocate for completion and follow up.                                 ? ?HRT/VAS Care Coordination   ? ? Patients Home Cardiology Office [facility] Clinic  ? Living arrangements for the past 2 months Apartment  ? Lives with: Minor Children; Spouse  ? Patient Current Insurance Coverage Self-Pay  ? Patient Has Concern With Paying Medical Bills Yes  ? Medical Bill Referrals: financial counseling for Medicaid screening  ? Does Patient Have Prescription Coverage? No  ? Patient Prescription Assistance Programs Heart Failure Fund  ? Home Assistive Devices/Equipment None  ? ?  ? ? ?Social History:                                                                             ?SDOH Screenings  ? ?Alcohol Screen: Not on file  ?Depression (PHQ2-9): Low Risk   ? PHQ-2 Score: 0  ?Financial Resource Strain: High Risk  ? Difficulty of Paying Living Expenses: Hard  ?Food  Insecurity: No Food Insecurity  ? Worried About RCharity fundraiserin the Last Year: Never true  ? Ran Out of Food in the Last Year: Never true  ?Housing: Low Risk   ? Last Housing Risk Score: 0  ?Physical Activity: Not on file  ?Social Connections: Not on file  ?Stress: Not on file  ?Tobacco Use: High Risk  ? Smoking Tobacco Use: Every Day  ? Smokeless Tobacco Use: Never  ? Passive Exposure: Not on file  ?Transportation Needs: No Transportation Needs  ?  Lack of Transportation (Medical): No  ? Lack of Transportation (Non-Medical): No  ? ? ?SDOH Interventions: ?Financial Resources:  Financial Strain Interventions: Other (Comment) (referral to HF fund) ?Social Security for Disability application assistance  ?Food Insecurity:  Food Insecurity Interventions: Intervention Not Indicated  ?Housing Insecurity:  Housing Interventions: Intervention Not Indicated  ?Transportation:   Transportation Interventions: Intervention Not Indicated  ? ?Follow-up plan:   ? ?Clinic to complete medication assistance application for Eliquis.   ? ?CSW will continue to follow pt and assist pt as needed ? ?Jorge Ny, LCSW ?Clinical Social Worker ?[facility] ?Desk#: [phone] ?Cell#: [phone] ? ? ? ?

## 2021-05-25 ENCOUNTER — Telehealth (HOSPITAL_COMMUNITY): Payer: Self-pay

## 2021-05-25 NOTE — Telephone Encounter (Signed)
Transitions of Care Pharmacy   Call attempted for a pharmacy transitions of care follow-up. Unable to leave voicemail.   Call attempt #2. Will follow-up in 2-3 days.    

## 2021-05-26 ENCOUNTER — Ambulatory Visit (INDEPENDENT_AMBULATORY_CARE_PROVIDER_SITE_OTHER): Payer: Self-pay | Admitting: Primary Care

## 2021-05-26 ENCOUNTER — Other Ambulatory Visit (HOSPITAL_COMMUNITY): Payer: Self-pay

## 2021-05-26 ENCOUNTER — Telehealth (HOSPITAL_COMMUNITY): Payer: Self-pay

## 2021-05-26 NOTE — Telephone Encounter (Signed)
Transitions of Care Pharmacy   Call attempted for a pharmacy transitions of care follow-up. Unable to leave voicemail.  Call attempt #3. Will no longer attempt follow up for TOC pharmacy   

## 2021-05-27 NOTE — Telephone Encounter (Signed)
I spoke with this pt  and scheduled an appt for 06/16/21. He missed his appt with Marcelino Duster yesterday.

## 2021-06-01 NOTE — Telephone Encounter (Signed)
Advanced Heart Failure Patient Advocate Encounter  ? ?Patient was approved to receive Eliquis from [employer]. ? ?Patient ID: [id number] ?Effective dates: 05/18/21 through 05/18/22 ? ?90 day supply delivered on 05/25/21. ? ?Archer Asa, CPhT ? ? ?

## 2021-06-03 NOTE — Progress Notes (Addendum)
? ?ADVANCED HF CLINIC CONSULT NOTE ? ? ?Primary Care: Kerin Perna, NP ?Primary Cardiologist: Dr. Harrell Gave ?HF Cardiologist: Dr. Haroldine Laws ? ?HPI: ?David [name] is a 41 y.o. with h/o  LV thrombus, GI bleed, gastritis, cocaine abuse, DMII, HTN, QQT prolongation, and systolic/diastolic HF.  ?  ?Admitted 05/13/21 with N/V followed by syncope. On admit had AKI, hypokalemia, and hypocalcemia. UDS + cocaine and opitates. ECHO with EF 25-30%. Cath preserved cardiac output and normal cors. Started on GDMT. Started on DOAC for possible LV thrombus.  ?  ?Seen in [facility] 3/23, volume up, NYHA III symptoms. Arlyce Harman and Scott added. ? ?Today he returns for HF follow up. Overall feeling fatigued. He is not SOB with walking or ADLs. Main complaint is a toothache. Denies palpitations, abnormal bleeding, CP, dizziness, edema, or PND/Orthopnea. Appetite ok. No fever or chills. Weight at home 145-146 pounds. Taking all medications. Handles cocaine does not wear gloves/mask, smokes 1/2 ppd cigarettes. He snores. Lives with his wife (she has 2 children and they have a 38 month old together), sometimes stays with his mom too.  ? ?  ?Cardiac Testing  ?- Echo 3/23 with global HK EF 25-30%, small LV thrombus. Possible Takotsubo ? ?- Echo 6/22 EF 50-55% RV normal  ?  ?- LHC 05/16/21 with no CAD, normal LVEDP -> CM is nonischemic ?  ?Review of Systems: [y] = yes, '[ ]'  = no  ? ?General: Weight gain '[ ]' ; Weight loss '[ ]' ; Anorexia '[ ]' ; Fatigue Blue.Reese ]; Fever '[ ]' ; Chills '[ ]' ; Weakness '[ ]'   ?Cardiac: Chest pain/pressure '[ ]' ; Resting SOB '[ ]' ; Exertional SOB '[ ]' ; Orthopnea '[ ]' ; Pedal Edema '[ ]' ; Palpitations '[ ]' ; Syncope '[ ]' ; Presyncope '[ ]' ; Paroxysmal nocturnal dyspnea'[ ]'   ?Pulmonary: Cough '[ ]' ; Wheezing'[ ]' ; Hemoptysis'[ ]' ; Sputum '[ ]' ; Snoring '[ ]'   ?GI: Vomiting'[ ]' ; Dysphagia'[ ]' ; Melena'[ ]' ; Hematochezia '[ ]' ; Heartburn'[ ]' ; Abdominal pain '[ ]' ; Constipation '[ ]' ; Diarrhea '[ ]' ; BRBPR '[ ]'   ?GU: Hematuria'[ ]' ; Dysuria '[ ]' ; Nocturia'[ ]'   ?Vascular: Pain in legs  with walking '[ ]' ; Pain in feet with lying flat '[ ]' ; Non-healing sores '[ ]' ; Stroke '[ ]' ; TIA '[ ]' ; Slurred speech '[ ]' ;  ?Neuro: Headaches'[ ]' ; Vertigo'[ ]' ; Seizures'[ ]' ; Paresthesias'[ ]' ;Blurred vision '[ ]' ; Diplopia '[ ]' ; Vision changes '[ ]'   ?Ortho/Skin: Arthritis '[ ]' ; Joint pain '[ ]' ; Muscle pain '[ ]' ; Joint swelling '[ ]' ; Back Pain '[ ]' ; Rash '[ ]'   ?Psych: Depression'[ ]' ; Anxiety'[ ]'   ?Heme: Bleeding problems '[ ]' ; Clotting disorders '[ ]' ; Anemia '[ ]'   ?Endocrine: Diabetes Blue.Reese ]; Thyroid dysfunction'[ ]'  ? ? ?Past Medical History:  ?Diagnosis Date  ? Diabetes mellitus ([place])   ? Hypertension   ? Metacarpal bone fracture 07/11/2015  ? right small  ? Neuropathy   ? 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 ?Current Outpatient Medications  ?Medication Sig Dispense Refill  ? [START ON 06/15/2021] APIXABAN (ELIQUIS) VTE STARTER PACK (10MG AND 5MG) Take as directed on package: start with two-85m tablets twice daily for 7 days. On day 8, switch to one-56mtablet twice daily. 74 each 0  ? carvedilol (COREG) 3.125 MG tablet Take 1 tablet (3.125 mg total) by mouth 2 (two) times daily with a meal. 60 tablet 3  ? empagliflozin (JARDIANCE) 10 MG TABS tablet Take 1 tablet (10 mg total) by mouth daily before breakfast. 30 tablet 3  ?  folic acid (FOLVITE) 1 MG tablet Take 1 tablet (1 mg total) by mouth daily. 90 tablet 0  ? insulin aspart (NOVOLOG) 100 UNIT/ML injection For blood sugars 0-150 give 0 units of insulin, 200-250 give 4 units of insulin, 251-300 give 6 units, 301-350 give 8 units, 351- 400 give 10 units, > 400 give 12 units and call M.D. ?Discussed hypoglycemia protocol. 10 mL PRN  ? Insulin Pen Needle (PEN NEEDLES 31GX5/16") 31G X 8 MM MISC As directed 100 each 1  ? losartan (COZAAR) 25 MG tablet Take 1 tablet (25 mg total) by mouth daily. 30 tablet 3  ? Multiple Vitamin (MULTIVITAMIN WITH MINERALS) TABS tablet Take 1 tablet by mouth daily. 90 tablet 0  ? mupirocin ointment (BACTROBAN) 2 % Place 1  application into the nose 2 (two) times daily. 22 g 0  ? pantoprazole (PROTONIX) 40 MG tablet Take 1 tablet (40 mg total) by mouth daily. 30 tablet 0  ? polyethylene glycol (MIRALAX / GLYCOLAX) 17 g packet Take 17 g by mouth daily as needed for mild constipation. 14 each 0  ? spironolactone (ALDACTONE) 25 MG tablet Take 0.5 tablets (12.5 mg total) by mouth daily. 15 tablet 3  ? thiamine 100 MG tablet Take 1 tablet (100 mg total) by mouth daily. 90 tablet 0  ? apixaban (ELIQUIS) 5 MG TABS tablet Take 1 tablet (5 mg total) by mouth 2 (two) times daily. (Patient not taking: Reported on 06/06/2021) 120 tablet 0  ? blood glucose meter kit and supplies KIT Dispense based on patient and insurance preference. Use up to four times daily as directed. (Patient not taking: Reported on 06/06/2021) 1 each 0  ? ?No current facility-administered medications for this encounter.  ? ?No Known Allergies ? ?Social History  ? ?Socioeconomic History  ? Marital status: Married  ?  Spouse name: Not on file  ? Number of children: Not on file  ? Years of education: Not on file  ? Highest education level: Not on file  ?Occupational History  ? Not on file  ?Tobacco Use  ? Smoking status: Every Day  ?  Packs/day: 1.00  ?  Years: 10.00  ?  Pack years: 10.00  ?  Types: Cigarettes  ? Smokeless tobacco: Never  ?Vaping Use  ? Vaping Use: Never used  ?Substance and Sexual Activity  ? Alcohol use: Yes  ?  Alcohol/week: 30.0 standard drinks  ?  Types: 30 Shots of liquor per week  ?  Comment: minimum 4 shots of liquor daily  ? Drug use: Yes  ?  Types: Oxycodone, Marijuana  ? Sexual activity: Yes  ?  Birth control/protection: Condom  ?Other Topics Concern  ? Not on file  ?Social History Narrative  ? Not on file  ? ?Social Determinants of Health  ? ?Financial Resource Strain: High Risk  ? Difficulty of Paying Living Expenses: Hard  ?Food Insecurity: No Food Insecurity  ? Worried About Charity fundraiser in the Last Year: Never true  ? Ran Out of Food in the  Last Year: Never true  ?Transportation Needs: No Transportation Needs  ? Lack of Transportation (Medical): No  ? Lack of Transportation (Non-Medical): No  ?Physical Activity: Not on file  ?Stress: Not on file  ?Social Connections: Not on file  ?Intimate Partner Violence: Not on file  ? ?Family History  ?Problem Relation Age of Onset  ? Diabetes Father   ? ?BP 90/76   Pulse (!) 102   Wt 67 kg (  147 lb 12.8 oz)   SpO2 97%   BMI 23.86 kg/m?  ? ?Wt Readings from Last 3 Encounters:  ?06/06/21 67 kg (147 lb 12.8 oz)  ?05/24/21 72.1 kg (159 lb)  ?05/17/21 68.8 kg (151 lb 10.8 oz)  ? ?PHYSICAL EXAM: ?General:  NAD. No resp difficulty, thin ?HEENT: Normal ?Neck: Supple. No JVD. Carotids 2+ bilat; no bruits. No lymphadenopathy or thryomegaly appreciated. ?Cor: PMI nondisplaced. Regular rate & rhythm. No rubs, gallops or murmurs. ?Lungs: Clear ?Abdomen: Soft, nontender, nondistended. No hepatosplenomegaly. No bruits or masses. Good bowel sounds. ?Extremities: No cyanosis, clubbing, rash, edema ?Neuro: Alert & oriented x 3, cranial nerves grossly intact. Moves all 4 extremities w/o difficulty. Affect pleasant. ? ?ECG: NSR 93 bpm, QTc 499 msec, inferior TWI (personally reviewed) ? ?ASSESSMENT & PLAN: ?1. HFrEF, NICM  ?- Echo (3/23): EF 25-30% Small LV thrombus. No cMRI with no insurance. ?- Cath(3/23): normal cors and stable hemodynamics. ?- NYHA II-early III (fatigue). Volume status looks ok today. ?- Continue carvedilol 3.125 mg bid. ?- Continue losartan 25 mg daily. No BP room for ARNi. ?- Continue spironolactone 12.5 mg daily.   ?- Continue Jardiance 10 mg daily. ?- Arlyce Harman and Jardiance through the HF fund.  ?- Echo next visit. ?- BMET today. ?  ?2. LV Thrombus ?- Continue Eliquis 5 mg bid. No bleeding issues ?- Recent CBC ok. ?  ?3. Cocaine Abuse/Tobacco abuse ?- Long discussion about avoiding cocaine. ?- Discussed smoking cessation, he has cut back to 1/2 ppd.  ?  ?4. DMII ?- Hgb A1C 6.7  ?- Per PCP.  ?- On SGLT2i.  ?   ?5. Prolonged QT  ?- QTc 499 ms.  ?- Check labs. ? ?6. Snoring ?- Discussed sleep study, he would like to defer ? ?7. Toothache ?- Offered abx until he can see dentist, he declined. ?- Try Tylenol, needs to see

## 2021-06-06 ENCOUNTER — Encounter (HOSPITAL_COMMUNITY): Payer: Self-pay

## 2021-06-06 ENCOUNTER — Ambulatory Visit (HOSPITAL_COMMUNITY)
Admission: RE | Admit: 2021-06-06 | Discharge: 2021-06-06 | Disposition: A | Discharge status: Home / Self Care | Payer: Medicaid Other | Source: Ambulatory Visit | Attending: Family Medicine | Admitting: Family Medicine

## 2021-06-06 VITALS — BP 90/76 | HR 102 | Wt 147.8 lb

## 2021-06-06 DIAGNOSIS — E119 Type 2 diabetes mellitus without complications: Secondary | ICD-10-CM

## 2021-06-06 DIAGNOSIS — I513 Intracardiac thrombosis, not elsewhere classified: Secondary | ICD-10-CM | POA: Diagnosis not present

## 2021-06-06 DIAGNOSIS — Z7901 Long term (current) use of anticoagulants: Secondary | ICD-10-CM | POA: Diagnosis not present

## 2021-06-06 DIAGNOSIS — R9431 Abnormal electrocardiogram [ECG] [EKG]: Secondary | ICD-10-CM | POA: Diagnosis not present

## 2021-06-06 DIAGNOSIS — I110 Hypertensive heart disease with heart failure: Secondary | ICD-10-CM | POA: Diagnosis present

## 2021-06-06 DIAGNOSIS — I5042 Chronic combined systolic (congestive) and diastolic (congestive) heart failure: Secondary | ICD-10-CM | POA: Diagnosis not present

## 2021-06-06 DIAGNOSIS — F17210 Nicotine dependence, cigarettes, uncomplicated: Secondary | ICD-10-CM | POA: Insufficient documentation

## 2021-06-06 DIAGNOSIS — F1190 Opioid use, unspecified, uncomplicated: Secondary | ICD-10-CM | POA: Diagnosis not present

## 2021-06-06 DIAGNOSIS — I444 Left anterior fascicular block: Secondary | ICD-10-CM | POA: Insufficient documentation

## 2021-06-06 DIAGNOSIS — Z79899 Other long term (current) drug therapy: Secondary | ICD-10-CM | POA: Insufficient documentation

## 2021-06-06 DIAGNOSIS — Z8719 Personal history of other diseases of the digestive system: Secondary | ICD-10-CM | POA: Insufficient documentation

## 2021-06-06 DIAGNOSIS — Z833 Family history of diabetes mellitus: Secondary | ICD-10-CM | POA: Insufficient documentation

## 2021-06-06 DIAGNOSIS — E876 Hypokalemia: Secondary | ICD-10-CM | POA: Diagnosis not present

## 2021-06-06 DIAGNOSIS — K0889 Other specified disorders of teeth and supporting structures: Secondary | ICD-10-CM | POA: Diagnosis not present

## 2021-06-06 DIAGNOSIS — I428 Other cardiomyopathies: Secondary | ICD-10-CM | POA: Diagnosis not present

## 2021-06-06 DIAGNOSIS — R5383 Other fatigue: Secondary | ICD-10-CM | POA: Diagnosis not present

## 2021-06-06 DIAGNOSIS — R0683 Snoring: Secondary | ICD-10-CM | POA: Diagnosis not present

## 2021-06-06 DIAGNOSIS — Z7984 Long term (current) use of oral hypoglycemic drugs: Secondary | ICD-10-CM | POA: Diagnosis not present

## 2021-06-06 DIAGNOSIS — E1140 Type 2 diabetes mellitus with diabetic neuropathy, unspecified: Secondary | ICD-10-CM | POA: Diagnosis not present

## 2021-06-06 DIAGNOSIS — I5021 Acute systolic (congestive) heart failure: Secondary | ICD-10-CM

## 2021-06-06 DIAGNOSIS — F1410 Cocaine abuse, uncomplicated: Secondary | ICD-10-CM | POA: Diagnosis not present

## 2021-06-06 DIAGNOSIS — I5022 Chronic systolic (congestive) heart failure: Secondary | ICD-10-CM

## 2021-06-06 DIAGNOSIS — Z720 Tobacco use: Secondary | ICD-10-CM

## 2021-06-06 DIAGNOSIS — Z794 Long term (current) use of insulin: Secondary | ICD-10-CM

## 2021-06-06 LAB — BASIC METABOLIC PANEL
Anion gap: 10 (ref 5–15)
BUN: 39 mg/dL — ABNORMAL HIGH (ref 6–20)
CO2: 25 mmol/L (ref 22–32)
Calcium: 9.5 mg/dL (ref 8.9–10.3)
Chloride: 100 mmol/L (ref 98–111)
Creatinine, Ser: 1.9 mg/dL — ABNORMAL HIGH (ref 0.61–1.24)
GFR, Estimated: 46 mL/min — ABNORMAL LOW (ref >60)
Glucose, Bld: 138 mg/dL — ABNORMAL HIGH (ref 70–99)
Potassium: 3.9 mmol/L (ref 3.5–5.1)
Sodium: 135 mmol/L (ref 135–145)

## 2021-06-06 NOTE — Addendum Note (Signed)
Encounter addended by: Jacklynn Ganong, FNP on: 06/06/2021 4:08 PM ? Actions taken: Clinical Note Signed

## 2021-06-06 NOTE — Patient Instructions (Signed)
There has been no changes to your medications. ? ?Labs done today, your results will be available in MyChart, we will contact you for abnormal readings. ? ? ?Your physician has requested that you have an echocardiogram. Echocardiography is a painless test that uses sound waves to create images of your heart. It provides your doctor with information about the size and shape of your heart and how well your heart?s chambers and valves are working. This procedure takes approximately one hour. There are no restrictions for this procedure. ? ?Your physician recommends that you schedule a follow-up appointment in: with Dr. Gala Romney as scheduled  ? ?If you have any questions or concerns before your next appointment please send [place] a message through [place] or call our office at [phone].   ? ?TO LEAVE A MESSAGE FOR THE NURSE SELECT OPTION 2, PLEASE LEAVE A MESSAGE INCLUDING: ?YOUR NAME ?DATE OF BIRTH ?CALL BACK NUMBER ?REASON FOR CALL**this is important as we prioritize the call backs ? ?YOU WILL RECEIVE A CALL BACK THE SAME DAY AS LONG AS YOU CALL BEFORE 4:00 PM ? ?At the [facility], you and your health needs are our priority. As part of our continuing mission to provide you with exceptional heart care, we have created designated Provider Care Teams. These Care Teams include your primary Cardiologist (physician) and Advanced Practice Providers (APPs- Physician Assistants and Nurse Practitioners) who all work together to provide you with the care you need, when you need it.  ? ?You may see any of the following providers on your designated Care Team at your next follow up: ?Dr Arvilla Meres ?Dr Marca Ancona ?Tonye Becket, NP ?Robbie Lis, PA ?Jessica Milford,NP ?Anna Genre, PA ?Karle Plumber, PharmD ? ? ?Please be sure to bring in all your medications bottles to every appointment.  ? ? ?

## 2021-06-07 NOTE — Addendum Note (Signed)
Encounter addended by: Jacklynn Ganong, FNP on: 06/07/2021 8:14 AM ? Actions taken: Clinical Note Signed

## 2021-06-16 ENCOUNTER — Encounter (INDEPENDENT_AMBULATORY_CARE_PROVIDER_SITE_OTHER): Payer: Self-pay

## 2021-06-16 ENCOUNTER — Institutional Professional Consult (permissible substitution) (INDEPENDENT_AMBULATORY_CARE_PROVIDER_SITE_OTHER): Payer: Self-pay | Admitting: Clinical

## 2021-06-16 ENCOUNTER — Ambulatory Visit (HOSPITAL_COMMUNITY)
Admission: RE | Admit: 2021-06-16 | Discharge: 2021-06-16 | Disposition: A | Discharge status: Home / Self Care | Payer: Medicaid Other | Source: Ambulatory Visit | Attending: Internal Medicine | Admitting: Internal Medicine

## 2021-06-16 ENCOUNTER — Other Ambulatory Visit (HOSPITAL_COMMUNITY): Payer: Self-pay

## 2021-06-16 ENCOUNTER — Telehealth (HOSPITAL_COMMUNITY): Payer: Self-pay | Admitting: Pharmacy Technician

## 2021-06-16 DIAGNOSIS — I5022 Chronic systolic (congestive) heart failure: Secondary | ICD-10-CM | POA: Diagnosis not present

## 2021-06-16 LAB — BASIC METABOLIC PANEL
Anion gap: 4 — ABNORMAL LOW (ref 5–15)
BUN: 14 mg/dL (ref 6–20)
CO2: 28 mmol/L (ref 22–32)
Calcium: 9.3 mg/dL (ref 8.9–10.3)
Chloride: 107 mmol/L (ref 98–111)
Creatinine, Ser: 1.03 mg/dL (ref 0.61–1.24)
GFR, Estimated: >60 mL/min (ref >60)
Glucose, Bld: 132 mg/dL — ABNORMAL HIGH (ref 70–99)
Potassium: 4.2 mmol/L (ref 3.5–5.1)
Sodium: 139 mmol/L (ref 135–145)

## 2021-06-16 MED ORDER — APIXABAN 5 MG PO TABS
5.0000 mg | ORAL_TABLET | Freq: Two times a day (BID) | ORAL | 3 refills | Status: DC
Start: 2021-06-16 — End: 2021-07-06
  Filled 2021-06-16: qty 60, 30d supply, fill #0

## 2021-06-16 MED ORDER — LOSARTAN POTASSIUM 25 MG PO TABS
12.5000 mg | ORAL_TABLET | Freq: Every day | ORAL | 3 refills | Status: DC
  Filled 2021-06-16: qty 15, 30d supply, fill #0

## 2021-06-16 MED ORDER — SPIRONOLACTONE 25 MG PO TABS
12.5000 mg | ORAL_TABLET | Freq: Every day | ORAL | 3 refills | Status: DC
  Filled 2021-06-16: qty 15, 30d supply, fill #0
  Filled 2021-07-05 (×2): qty 15, 30d supply, fill #1

## 2021-06-16 MED ORDER — EMPAGLIFLOZIN 10 MG PO TABS
10.0000 mg | ORAL_TABLET | Freq: Every day | ORAL | 3 refills | Status: DC
  Filled 2021-06-16: qty 30, 30d supply, fill #0

## 2021-06-16 NOTE — Telephone Encounter (Signed)
Patient Advocate Encounter ?  ?Received notification from [facility] that prior authorization for Jardiance is required. ?  ?PA submitted on CoverMyMeds ?Key [id number] ?Status is pending ?  ?Will continue to follow. ? ?

## 2021-06-17 NOTE — Telephone Encounter (Signed)
Advanced Heart Failure Patient Advocate Encounter ? ?Prior Authorization for David [name] has been approved.   ? ?PA# [id number] ?Effective dates: 06/02/21 through 06/16/22 ? ?Charlann Boxer, CPhT ? ? ?

## 2021-06-21 ENCOUNTER — Other Ambulatory Visit: Payer: Self-pay

## 2021-06-21 ENCOUNTER — Emergency Department (HOSPITAL_COMMUNITY): Payer: Medicaid Other

## 2021-06-21 ENCOUNTER — Emergency Department (HOSPITAL_COMMUNITY)
Admission: EM | Admit: 2021-06-21 | Discharge: 2021-06-22 | Disposition: A | Discharge status: Home / Self Care | Payer: Medicaid Other | Attending: Emergency Medicine | Admitting: Emergency Medicine

## 2021-06-21 ENCOUNTER — Encounter (HOSPITAL_COMMUNITY): Payer: Self-pay | Admitting: Emergency Medicine

## 2021-06-21 DIAGNOSIS — R079 Chest pain, unspecified: Secondary | ICD-10-CM | POA: Diagnosis present

## 2021-06-21 DIAGNOSIS — E876 Hypokalemia: Secondary | ICD-10-CM | POA: Diagnosis not present

## 2021-06-21 DIAGNOSIS — Z794 Long term (current) use of insulin: Secondary | ICD-10-CM | POA: Diagnosis not present

## 2021-06-21 DIAGNOSIS — M549 Dorsalgia, unspecified: Secondary | ICD-10-CM | POA: Insufficient documentation

## 2021-06-21 DIAGNOSIS — Z7901 Long term (current) use of anticoagulants: Secondary | ICD-10-CM | POA: Diagnosis not present

## 2021-06-21 DIAGNOSIS — I10 Essential (primary) hypertension: Secondary | ICD-10-CM | POA: Insufficient documentation

## 2021-06-21 DIAGNOSIS — R1110 Vomiting, unspecified: Secondary | ICD-10-CM

## 2021-06-21 LAB — COMPREHENSIVE METABOLIC PANEL
ALT: 18 U/L (ref 0–44)
AST: 28 U/L (ref 15–41)
Albumin: 4.1 g/dL (ref 3.5–5.0)
Alkaline Phosphatase: 42 U/L (ref 38–126)
Anion gap: 10 (ref 5–15)
BUN: 10 mg/dL (ref 6–20)
CO2: 25 mmol/L (ref 22–32)
Calcium: 9.7 mg/dL (ref 8.9–10.3)
Chloride: 104 mmol/L (ref 98–111)
Creatinine, Ser: 1.03 mg/dL (ref 0.61–1.24)
GFR, Estimated: >60 mL/min (ref >60)
Glucose, Bld: 177 mg/dL — ABNORMAL HIGH (ref 70–99)
Potassium: 3.8 mmol/L (ref 3.5–5.1)
Sodium: 139 mmol/L (ref 135–145)
Total Bilirubin: 1.3 mg/dL — ABNORMAL HIGH (ref 0.3–1.2)
Total Protein: 7.5 g/dL (ref 6.5–8.1)

## 2021-06-21 LAB — CBC WITH DIFFERENTIAL/PLATELET
Abs Immature Granulocytes: 0.02 10*3/uL (ref 0.00–0.07)
Basophils Absolute: 0 10*3/uL (ref 0.0–0.1)
Basophils Relative: 0 %
Eosinophils Absolute: 0 10*3/uL (ref 0.0–0.5)
Eosinophils Relative: 0 %
HCT: 45.1 % (ref 39.0–52.0)
Hemoglobin: 14.7 g/dL (ref 13.0–17.0)
Immature Granulocytes: 0 %
Lymphocytes Relative: 14 %
Lymphs Abs: 1.2 10*3/uL (ref 0.7–4.0)
MCH: 27 pg (ref 26.0–34.0)
MCHC: 32.6 g/dL (ref 30.0–36.0)
MCV: 82.9 fL (ref 80.0–100.0)
Monocytes Absolute: 0.2 10*3/uL (ref 0.1–1.0)
Monocytes Relative: 3 %
Neutro Abs: 6.7 10*3/uL (ref 1.7–7.7)
Neutrophils Relative %: 83 %
Platelets: 353 10*3/uL (ref 150–400)
RBC: 5.44 MIL/uL (ref 4.22–5.81)
RDW: 14.2 % (ref 11.5–15.5)
WBC: 8.2 10*3/uL (ref 4.0–10.5)
nRBC: 0 % (ref 0.0–0.2)

## 2021-06-21 LAB — BRAIN NATRIURETIC PEPTIDE: B Natriuretic Peptide: 180.3 pg/mL — ABNORMAL HIGH (ref 0.0–100.0)

## 2021-06-21 LAB — TROPONIN I (HIGH SENSITIVITY)
Troponin I (High Sensitivity): 12 ng/L (ref <18)
Troponin I (High Sensitivity): 15 ng/L (ref <18)

## 2021-06-21 LAB — LIPASE, BLOOD: Lipase: 34 U/L (ref 11–51)

## 2021-06-21 MED ORDER — SPIRONOLACTONE 12.5 MG HALF TABLET
12.5000 mg | ORAL_TABLET | Freq: Every day | ORAL | Status: DC
  Administered 2021-06-21: 12.5 mg via ORAL
  Filled 2021-06-21: qty 1

## 2021-06-21 MED ORDER — ONDANSETRON HCL 4 MG PO TABS
4.0000 mg | ORAL_TABLET | Freq: Three times a day (TID) | ORAL | 0 refills | Status: DC | PRN
  Filled 2021-06-22: qty 20, 7d supply, fill #0

## 2021-06-21 MED ORDER — ONDANSETRON HCL 4 MG/2ML IJ SOLN
INTRAMUSCULAR | Status: AC
  Filled 2021-06-21: qty 2

## 2021-06-21 MED ORDER — ONDANSETRON HCL 4 MG/2ML IJ SOLN
4.0000 mg | Freq: Once | INTRAMUSCULAR | Status: AC
  Administered 2021-06-21: 4 mg via INTRAVENOUS

## 2021-06-21 MED ORDER — LOSARTAN POTASSIUM 25 MG PO TABS
12.5000 mg | ORAL_TABLET | Freq: Every day | ORAL | Status: DC
  Administered 2021-06-21: 12.5 mg via ORAL
  Filled 2021-06-21: qty 0.5

## 2021-06-21 MED ORDER — CARVEDILOL 3.125 MG PO TABS
3.1250 mg | ORAL_TABLET | Freq: Two times a day (BID) | ORAL | Status: DC
  Administered 2021-06-21: 3.125 mg via ORAL
  Filled 2021-06-21: qty 1

## 2021-06-21 NOTE — ED Notes (Signed)
Pt reporting no CP or SOB at this time, pt reports no nausea at this time but reports 1 episode of vomiting ~15 min ago  ?

## 2021-06-21 NOTE — ED Notes (Addendum)
Pt alert, NAD, restless, requesting drink. BP checked while lying still, good cuff, good position, calm. BP elevated. Denies taking BP meds, or prescribed meds.  ?

## 2021-06-21 NOTE — Discharge Instructions (Signed)
Your history, exam, work-up today showed persistent elevated blood pressures but they began to improve with your home medicine.  Your nausea and vomiting improved after medications and your labs did not show evidence of acute cardiac injury at this time.  We feel you are safe for discharge home after proving stability for approximately 9 hours.  Please follow-up with your primary team and rest and stay hydrated.  If any symptoms change or worsen acutely, please return to the nearest emergency department. ?

## 2021-06-21 NOTE — ED Notes (Signed)
Dr. Tegeler at bedside.  

## 2021-06-21 NOTE — ED Triage Notes (Signed)
Pt's cp started this morning. Hx CHF. Pt nauseous and vomiting for EMS. Diaphoretic, inverted T waves on EKG. EMS gave 324mg  ASA and 1 intro. BP 180/120, HR 75 ?

## 2021-06-21 NOTE — ED Notes (Addendum)
EDP at [facility]. CP resolved. Vomiting resolved. ?

## 2021-06-21 NOTE — ED Notes (Signed)
MD aware of pt BP  

## 2021-06-21 NOTE — ED Provider Notes (Signed)
?[facility] ?Provider Note ? ? ?CSN: [id number] ?Arrival date & time: 06/21/21  1439 ? ?  ? ?History ? ?Chief Complaint  ?Patient presents with  ? Chest Pain  ? ? ?David [name] is a 41 y.o. male. ? ?HPI ?-Presenting for evaluation of back pain, bladder chest pain and shortness of breath, both of which have resolved, after treatment by EMS.  He continues to feel nauseated and has vomited in the ED prior to my seeing him.  He states that yesterday he was well and is following his prescribed medical therapy ?  ? ?Home Medications ?Prior to Admission medications   ?Medication Sig Start Date End Date Taking? Authorizing Provider  ?apixaban (ELIQUIS) 5 MG TABS tablet Take 1 tablet (5 mg total) by mouth 2 (two) times daily. 06/16/21   Rafael Bihari, FNP  ?APIXABAN (ELIQUIS) VTE STARTER PACK (10MG AND 5MG) Take as directed on package: start with two-20m tablets twice daily for 7 days. On day 8, switch to one-514mtablet twice daily. 06/15/21   HaKayleen MemosDO  ?blood glucose meter kit and supplies KIT Dispense based on patient and insurance preference. Use up to four times daily as directed. 05/17/21   HaKayleen MemosDO  ?carvedilol (COREG) 3.125 MG tablet Take 1 tablet (3.125 mg total) by mouth 2 (two) times daily with a meal. 05/24/21   Clegg, Amy D, NP  ?empagliflozin (JARDIANCE) 10 MG TABS tablet Take 1 tablet (10 mg total) by mouth daily before breakfast. 06/16/21   [place], JeMaricela BoFNP  ?folic acid (FOLVITE) 1 MG tablet Take 1 tablet (1 mg total) by mouth daily. 05/17/21 08/15/21  HaKayleen MemosDO  ?insulin aspart (NOVOLOG) 100 UNIT/ML injection For blood sugars 0-150 give 0 units of insulin, 200-250 give 4 units of insulin, 251-300 give 6 units, 301-350 give 8 units, 351- 400 give 10 units, > 400 give 12 units and call M.D. ?Discussed hypoglycemia protocol. 01/13/21   EdKerin PernaNP  ?Insulin Pen Needle (PEN NEEDLES 31GX5/16") 31G X 8 MM MISC As directed  08/16/20   MeKathie DikeMD  ?losartan (COZAAR) 25 MG tablet Take 0.5 tablets (12.5 mg total) by mouth daily. 06/16/21   MiRafael BihariFNP  ?Multiple Vitamin (MULTIVITAMIN WITH MINERALS) TABS tablet Take 1 tablet by mouth daily. 05/17/21 08/15/21  HaKayleen MemosDO  ?mupirocin ointment (BACTROBAN) 2 % Place 1 application into the nose 2 (two) times daily. 05/17/21   HaKayleen MemosDO  ?pantoprazole (PROTONIX) 40 MG tablet Take 1 tablet (40 mg total) by mouth daily. 05/17/21 06/21/21  HaKayleen MemosDO  ?polyethylene glycol (MIRALAX / GLYCOLAX) 17 g packet Take 17 g by mouth daily as needed for mild constipation. 05/17/21   HaKayleen MemosDO  ?spironolactone (ALDACTONE) 25 MG tablet Take 0.5 tablets (12.5 mg total) by mouth daily. 06/16/21   MiRafael BihariFNP  ?thiamine 100 MG tablet Take 1 tablet (100 mg total) by mouth daily. 05/17/21 08/15/21  HaKayleen MemosDO  ?   ? ?Allergies    ?Patient has no known allergies.   ? ?Review of Systems   ?Review of Systems ? ?Physical Exam ?Updated Vital Signs ?BP (!) 204/114 (BP Location: Right Arm)   Pulse 78   Temp 99.4 ?F (37.4 ?C) (Oral)   Resp (!) 22   SpO2 99%  ?Physical Exam ?Vitals and nursing note reviewed.  ?Constitutional:   ?   General:  He is in acute distress (He has restless).  ?   Appearance: He is well-developed. He is not ill-appearing.  ?HENT:  ?   Head: Normocephalic and atraumatic.  ?   Right Ear: External ear normal.  ?   Left Ear: External ear normal.  ?Eyes:  ?   Conjunctiva/sclera: Conjunctivae normal.  ?   Pupils: Pupils are equal, round, and reactive to light.  ?Neck:  ?   Trachea: Phonation normal.  ?Cardiovascular:  ?   Rate and Rhythm: Normal rate.  ?Pulmonary:  ?   Effort: Pulmonary effort is normal.  ?Abdominal:  ?   General: There is no distension.  ?Musculoskeletal:     ?   General: Normal range of motion.  ?   Cervical back: Normal range of motion and neck supple.  ?Skin: ?   General: Skin is warm and dry.  ?Neurological:  ?    Mental Status: He is alert and oriented to person, place, and time.  ?   Cranial Nerves: No cranial nerve deficit.  ?   Sensory: No sensory deficit.  ?   Motor: No abnormal muscle tone.  ?   Coordination: Coordination normal.  ?Psychiatric:     ?   Attention and Perception: He is inattentive.     ?   Mood and Affect: Mood is anxious.     ?   Speech: He is communicative.     ?   Behavior: Behavior is hyperactive.     ?   Thought Content: Thought content is not delusional.     ?   Cognition and Memory: Cognition is impaired.     ?   Judgment: Judgment is impulsive.  ? ? ?ED Results / Procedures / Treatments   ?Labs ?(all labs ordered are listed, but only abnormal results are displayed) ?Labs Reviewed  ?COMPREHENSIVE METABOLIC PANEL - Abnormal; Notable for the following components:  ?    Result Value  ? Glucose, Bld 177 (*)   ? Total Bilirubin 1.3 (*)   ? All other components within normal limits  ?CBC WITH DIFFERENTIAL/PLATELET  ?LIPASE, BLOOD  ?BRAIN NATRIURETIC PEPTIDE  ?TROPONIN I (HIGH SENSITIVITY)  ?TROPONIN I (HIGH SENSITIVITY)  ? ? ?EKG ?EKG Interpretation ? ?Date/Time:  Tuesday June 21 2021 14:48:18 EDT ?Ventricular Rate:  70 ?PR Interval:  158 ?QRS Duration: 98 ?QT Interval:  462 ?QTC Calculation: 499 ?R Axis:   -68 ?Text Interpretation: Sinus rhythm Consider right atrial enlargement LAD, consider left anterior fascicular block Repol abnrm, global ischemia, diffuse leads since last tracing no significant change Confirmed by Daleen Bo [phone]) on 06/21/2021 2:51:34 PM ? ?Radiology ?DG Chest Portable 1 View ? ?Result Date: 06/21/2021 ?CLINICAL DATA:  Shortness of breath. EXAM: PORTABLE CHEST 1 VIEW COMPARISON:  None. FINDINGS: No consolidation. No visible pleural effusions or pneumothorax. Cardiomediastinal silhouette is within normal limits. No displaced fracture. IMPRESSION: No evidence of acute cardiopulmonary disease. Electronically Signed   By: Margaretha Sheffield M.D.   On: 06/21/2021 15:29    ? ?Procedures ?Procedures  ? ? ?Medications Ordered in ED ?Medications  ?carvedilol (COREG) tablet 3.125 mg (has no administration in time range)  ?losartan (COZAAR) tablet 12.5 mg (has no administration in time range)  ?spironolactone (ALDACTONE) tablet 12.5 mg (has no administration in time range)  ?ondansetron (ZOFRAN) injection 4 mg (4 mg Intravenous Given 06/21/21 1450)  ? ? ?ED Course/ Medical Decision Making/ A&P ?  ?                        ?  Medical Decision Making ?Patient presenting by EMS for evaluation of chest pain which started after vomiting.  During transport he received nitroglycerin and aspirin.  He was also given Zofran for vomiting.  He was recently hospitalized about a month ago with syncope.  He was found to be abusing cocaine ? ?Problems Addressed: ?Hypertension, unspecified type: chronic illness or injury ?   Details: Aggravated by vomiting today and not tolerating usual medicines ?Vomiting, unspecified vomiting type, unspecified whether nausea present: acute illness or injury ?   Details: Started this morning, cause not clear ? ?Amount and/or Complexity of Data Reviewed ?External Data Reviewed: labs, radiology and notes. ?   Details: Copy from EMR: ?Mr Rodgers is a 41 y.o. with h/o  LV thrombus, GI bleed, gastritis, cocaine abuse, DMII, HTN, QQT prolongation, and systolic/diastolic HF.  ?  ?Admitted 05/13/21 with N/V followed by syncope. On admit had AKI, hypokalemia, and hypocalcemia. UDS + cocaine and opitates. ECHO with EF 25-30%. Cath preserved cardiac output and normal cors. Started on GDMT. Started on DOAC for possible LV thrombus.  ?  ?Seen in [facility] 3/23, volume up, NYHA III symptoms. Arlyce Harman and [place] added. ?  ?Today he returns for HF follow up. Overall feeling fatigued. He is not SOB with walking or ADLs. Main complaint is a toothache. Denies palpitations, abnormal bleeding, CP, dizziness, edema, or PND/Orthopnea. Appetite ok. No fever or chills. Weight at home 145-146 pounds. Taking  all medications. Handles cocaine does not wear gloves/mask, smokes 1/2 ppd cigarettes. He snores. Lives with his wife (she has 2 children and they have a 38 month old together), sometimes stays with his mom too.  ?  ?  ?Cardiac

## 2021-06-21 NOTE — ED Notes (Signed)
Pain, sob, nausea improved, vomiting abated. Preoccupied with sleep, minimal answers to questions.  ?

## 2021-06-21 NOTE — ED Provider Notes (Signed)
5:25 PM ?Care assumed from Dr. Effie Shy.  At time of transfer of care, patient is awaiting for results of BMP, delta troponin, and p.o. challenge.  Per previous team, his primary complaint was nausea and vomiting and as it has improved, if he passes p.o. challenge and labs are not significantly abnormal, plan of care will be to discharge home. ? ?11:38 PM ?After home blood pressure medicine, blood pressures been improving.  On reassessment patient is feeling much better and was able to tolerate p.o.  He had negative troponin x2 and BNP is only minimally elevated.  He agrees with plan for discharge home.  We will give prescription for nausea medicine and he will follow-up with his PCP.  He had no other questions or concerns and was discharged in good condition. ? ? ?Clinical Impression: ?1. Vomiting, unspecified vomiting type, unspecified whether nausea present   ?2. Hypertension, unspecified type   ? ? ?Disposition: Discharge ? ?Condition: Good ? ?I have discussed the results, Dx and Tx plan with the pt(& family if present). He/she/they expressed understanding and agree(s) with the plan. Discharge instructions discussed at great length. Strict return precautions discussed and pt &/or family have verbalized understanding of the instructions. No further questions at time of discharge.  ? ? ?New Prescriptions  ? ONDANSETRON (ZOFRAN) 4 MG TABLET    Take 1 tablet (4 mg total) by mouth every 8 (eight) hours as needed for nausea or vomiting.  ? ? ?Follow Up: ?Grayce Sessions, NP ?[address] ?[place] [address] ?[phone] ? ? ? ? ?[facility] EMERGENCY DEPARTMENT ?[address] ?[id number] ?[place] [address] ?[phone] ? ? ? ? ?  ?Lonette Stevison, Canary Brim, MD ?06/21/21 2340 ? ?

## 2021-06-21 NOTE — ED Notes (Signed)
Tolerating PO fluids/ ginger ale ?

## 2021-06-22 ENCOUNTER — Other Ambulatory Visit (HOSPITAL_COMMUNITY): Payer: Self-pay

## 2021-06-22 NOTE — ED Notes (Signed)
Pt waiting on ride who is bringing him clothes  ?

## 2021-06-22 NOTE — ED Notes (Signed)
Patient verbalizes understanding of discharge instructions. Opportunity for questioning and answers were provided. Armband removed by staff, pt discharged from ED via wheelchair.  

## 2021-07-05 ENCOUNTER — Other Ambulatory Visit (HOSPITAL_COMMUNITY): Payer: Self-pay

## 2021-07-06 ENCOUNTER — Ambulatory Visit (HOSPITAL_COMMUNITY)
Admission: RE | Admit: 2021-07-06 | Discharge: 2021-07-06 | Disposition: A | Discharge status: Home / Self Care | Payer: Medicaid Other | Source: Ambulatory Visit | Attending: Internal Medicine | Admitting: Internal Medicine

## 2021-07-06 ENCOUNTER — Ambulatory Visit (HOSPITAL_BASED_OUTPATIENT_CLINIC_OR_DEPARTMENT_OTHER)
Admission: RE | Admit: 2021-07-06 | Discharge: 2021-07-06 | Disposition: A | Discharge status: Home / Self Care | Payer: Medicaid Other | Source: Ambulatory Visit | Attending: Family Medicine | Admitting: Family Medicine

## 2021-07-06 ENCOUNTER — Ambulatory Visit (HOSPITAL_COMMUNITY): Admission: RE | Admit: 2021-07-06 | Payer: Medicaid Other | Source: Ambulatory Visit

## 2021-07-06 ENCOUNTER — Encounter (HOSPITAL_COMMUNITY): Payer: Self-pay | Admitting: Internal Medicine

## 2021-07-06 VITALS — BP 130/88 | HR 75 | Wt 152.2 lb

## 2021-07-06 DIAGNOSIS — I513 Intracardiac thrombosis, not elsewhere classified: Secondary | ICD-10-CM | POA: Diagnosis not present

## 2021-07-06 DIAGNOSIS — I5022 Chronic systolic (congestive) heart failure: Secondary | ICD-10-CM | POA: Diagnosis not present

## 2021-07-06 DIAGNOSIS — E1140 Type 2 diabetes mellitus with diabetic neuropathy, unspecified: Secondary | ICD-10-CM | POA: Diagnosis not present

## 2021-07-06 DIAGNOSIS — F17210 Nicotine dependence, cigarettes, uncomplicated: Secondary | ICD-10-CM | POA: Insufficient documentation

## 2021-07-06 DIAGNOSIS — I5021 Acute systolic (congestive) heart failure: Secondary | ICD-10-CM

## 2021-07-06 DIAGNOSIS — Z86718 Personal history of other venous thrombosis and embolism: Secondary | ICD-10-CM | POA: Diagnosis not present

## 2021-07-06 DIAGNOSIS — I428 Other cardiomyopathies: Secondary | ICD-10-CM | POA: Insufficient documentation

## 2021-07-06 DIAGNOSIS — E785 Hyperlipidemia, unspecified: Secondary | ICD-10-CM | POA: Insufficient documentation

## 2021-07-06 DIAGNOSIS — R9431 Abnormal electrocardiogram [ECG] [EKG]: Secondary | ICD-10-CM | POA: Diagnosis not present

## 2021-07-06 DIAGNOSIS — Z7901 Long term (current) use of anticoagulants: Secondary | ICD-10-CM | POA: Diagnosis not present

## 2021-07-06 DIAGNOSIS — I110 Hypertensive heart disease with heart failure: Secondary | ICD-10-CM | POA: Diagnosis not present

## 2021-07-06 DIAGNOSIS — I081 Rheumatic disorders of both mitral and tricuspid valves: Secondary | ICD-10-CM | POA: Diagnosis not present

## 2021-07-06 DIAGNOSIS — Z8719 Personal history of other diseases of the digestive system: Secondary | ICD-10-CM | POA: Diagnosis not present

## 2021-07-06 DIAGNOSIS — Z720 Tobacco use: Secondary | ICD-10-CM

## 2021-07-06 DIAGNOSIS — Z7984 Long term (current) use of oral hypoglycemic drugs: Secondary | ICD-10-CM | POA: Insufficient documentation

## 2021-07-06 DIAGNOSIS — I10 Essential (primary) hypertension: Secondary | ICD-10-CM

## 2021-07-06 LAB — BRAIN NATRIURETIC PEPTIDE: B Natriuretic Peptide: 20.9 pg/mL (ref 0.0–100.0)

## 2021-07-06 LAB — BASIC METABOLIC PANEL
Anion gap: 7 (ref 5–15)
BUN: 13 mg/dL (ref 6–20)
CO2: 22 mmol/L (ref 22–32)
Calcium: 9.3 mg/dL (ref 8.9–10.3)
Chloride: 107 mmol/L (ref 98–111)
Creatinine, Ser: 0.98 mg/dL (ref 0.61–1.24)
GFR, Estimated: >60 mL/min (ref >60)
Glucose, Bld: 131 mg/dL — ABNORMAL HIGH (ref 70–99)
Potassium: 3.9 mmol/L (ref 3.5–5.1)
Sodium: 136 mmol/L (ref 135–145)

## 2021-07-06 LAB — CBC
HCT: 40 % (ref 39.0–52.0)
Hemoglobin: 13.3 g/dL (ref 13.0–17.0)
MCH: 27.7 pg (ref 26.0–34.0)
MCHC: 33.3 g/dL (ref 30.0–36.0)
MCV: 83.3 fL (ref 80.0–100.0)
Platelets: 235 10*3/uL (ref 150–400)
RBC: 4.8 MIL/uL (ref 4.22–5.81)
RDW: 14.9 % (ref 11.5–15.5)
WBC: 7.8 10*3/uL (ref 4.0–10.5)
nRBC: 0 % (ref 0.0–0.2)

## 2021-07-06 LAB — ECHOCARDIOGRAM COMPLETE
Area-P 1/2: 3.06 cm2
Calc EF: 43.7 %
S' Lateral: 3.8 cm
Single Plane A2C EF: 42.5 %
Single Plane A4C EF: 44 %

## 2021-07-06 MED ORDER — LOSARTAN POTASSIUM 50 MG PO TABS: 50.0000 mg | ORAL_TABLET | Freq: Every day | ORAL | 6 refills | Status: DC

## 2021-07-06 NOTE — Patient Instructions (Signed)
Medication Changes: ? ?STOP Eliquis ? ?INCREASE Losartan to 50 mg Daily ? ?Lab Work: ? ?Done today, we will call you for abnormal results ? ?Testing/Procedures: ? ?None ? ?Referrals: ? ?None ? ?Special Instructions // Education: ? ?Do the following things EVERYDAY: ?Weigh yourself in the morning before breakfast. Write it down and keep it in a log. ?Take your medicines as prescribed ?Eat low salt foods--Limit salt (sodium) to 2000 mg per day.  ?Stay as active as you can everyday ?Limit all fluids for the day to less than 2 liters ? ? ?Follow-Up in: 3 months ? ?At the [facility], you and your health needs are our priority. We have a designated team specialized in the treatment of Heart Failure. This Care Team includes your primary Heart Failure Specialized Cardiologist (physician), Advanced Practice Providers (APPs- Physician Assistants and Nurse Practitioners), and Pharmacist who all work together to provide you with the care you need, when you need it.  ? ?You may see any of the following providers on your designated Care Team at your next follow up: ? ?Dr Arvilla Meres ?Dr Marca Ancona ?Tonye Becket, NP ?Robbie Lis, PA ?Jessica Milford,NP ?Anna Genre, PA ?Karle Plumber, PharmD ? ? ?Please be sure to bring in all your medications bottles to every appointment.  ? ?Need to Contact us: ? ?If you have any questions or concerns before your next appointment please send [place] a message through [place] or call our office at [phone].   ? ?TO LEAVE A MESSAGE FOR THE NURSE SELECT OPTION 2, PLEASE LEAVE A MESSAGE INCLUDING: ?YOUR NAME ?DATE OF BIRTH ?CALL BACK NUMBER ?REASON FOR CALL**this is important as we prioritize the call backs ? ?YOU WILL RECEIVE A CALL BACK THE SAME DAY AS LONG AS YOU CALL BEFORE 4:00 PM ? ? ?

## 2021-07-06 NOTE — Addendum Note (Signed)
Encounter addended by: Scarlette Calico, RN on: 07/06/2021 4:43 PM ? Actions taken: Diagnosis association updated, Medication long-term status modified, Pharmacy for encounter modified, Order list changed, Clinical Note Signed, Charge Capture section accepted

## 2021-07-06 NOTE — Progress Notes (Signed)
? ?[facility] CONSULT NOTE ? ? ?Primary Care: Kerin Perna, NP ?Primary Cardiologist: Dr. Harrell Gave ?HF Cardiologist: Dr. Haroldine Laws ? ?HPI: ?David [name] is a 41 y.o. with h/o  LV thrombus, GI bleed, gastritis, cocaine abuse, DMII, HTN, QQT prolongation, and systolic/diastolic HF.  ?  ?Admitted 05/13/21 with N/V followed by syncope. On admit had AKI, hypokalemia, and hypocalcemia. UDS + cocaine and opitates. ECHO with EF 25-30%. Cath preserved cardiac output and normal cors. Started on GDMT. Started on DOAC for possible LV thrombus.  ?  ?Seen in [facility] 3/23, volume up, NYHA III symptoms. Arlyce Harman and [place] added. ? ?Today he returns for HF follow up. Feels pretty good. Under a lot of stress as he is apart from his wife. Not using cocaine. Smoking 1/2 ppd. No ETOH.  ? ?Echo today 07/06/21: EF 40-45% RV normal No LV thrombus  ?  ?Cardiac Testing  ?- Echo 3/23 with global HK EF 25-30%, small LV thrombus. Possible Takotsubo ? ?- Echo 6/22 EF 50-55% RV normal  ?  ?- LHC 05/16/21 with no CAD, normal LVEDP -> CM is nonischemic ?  ? ? ?Past Medical History:  ?Diagnosis Date  ? Diabetes mellitus ([place])   ? Hypertension   ? Metacarpal bone fracture 07/11/2015  ? right small  ? Neuropathy   ? 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 ?Current Outpatient Medications  ?Medication Sig Dispense Refill  ? acetaminophen (TYLENOL) 500 MG tablet Take 500 mg by mouth every 6 (six) hours as needed for moderate pain.    ? apixaban (ELIQUIS) 5 MG TABS tablet Take 1 tablet (5 mg total) by mouth 2 (two) times daily. 60 tablet 3  ? blood glucose meter kit and supplies KIT Dispense based on patient and insurance preference. Use up to four times daily as directed. 1 each 0  ? carvedilol (COREG) 3.125 MG tablet Take 1 tablet (3.125 mg total) by mouth 2 (two) times daily with a meal. 60 tablet 3  ? empagliflozin (JARDIANCE) 10 MG TABS tablet Take 1 tablet (10 mg total) by mouth daily before breakfast.  30 tablet 3  ? folic acid (FOLVITE) 1 MG tablet Take 1 tablet (1 mg total) by mouth daily. 90 tablet 0  ? insulin aspart (NOVOLOG) 100 UNIT/ML injection For blood sugars 0-150 give 0 units of insulin, 200-250 give 4 units of insulin, 251-300 give 6 units, 301-350 give 8 units, 351- 400 give 10 units, > 400 give 12 units and call M.D. ?Discussed hypoglycemia protocol. 10 mL PRN  ? Insulin Pen Needle (PEN NEEDLES 31GX5/16") 31G X 8 MM MISC As directed 100 each 1  ? losartan (COZAAR) 25 MG tablet Take 0.5 tablets (12.5 mg total) by mouth daily. 15 tablet 3  ? ondansetron (ZOFRAN) 4 MG tablet Take 1 tablet (4 mg total) by mouth every 8 (eight) hours as needed for nausea or vomiting. 20 tablet 0  ? pantoprazole (PROTONIX) 40 MG tablet Take 1 tablet (40 mg total) by mouth daily. 30 tablet 0  ? polyethylene glycol (MIRALAX / GLYCOLAX) 17 g packet Take 17 g by mouth daily as needed for mild constipation. 14 each 0  ? spironolactone (ALDACTONE) 25 MG tablet Take 0.5 tablets (12.5 mg total) by mouth daily. 15 tablet 3  ? thiamine 100 MG tablet Take 1 tablet (100 mg total) by mouth daily. 90 tablet 0  ? ?No current facility-administered medications for this encounter.  ? ?No  Known Allergies ? ?Social History  ? ?Socioeconomic History  ? Marital status: Married  ?  Spouse name: Not on file  ? Number of children: Not on file  ? Years of education: Not on file  ? Highest education level: Not on file  ?Occupational History  ? Not on file  ?Tobacco Use  ? Smoking status: Every Day  ?  Packs/day: 1.00  ?  Years: 10.00  ?  Pack years: 10.00  ?  Types: Cigarettes  ? Smokeless tobacco: Never  ?Vaping Use  ? Vaping Use: Never used  ?Substance and Sexual Activity  ? Alcohol use: Yes  ?  Alcohol/week: 30.0 standard drinks  ?  Types: 30 Shots of liquor per week  ?  Comment: minimum 4 shots of liquor daily  ? Drug use: Yes  ?  Types: Oxycodone, Marijuana  ? Sexual activity: Yes  ?  Birth control/protection: Condom  ?Other Topics Concern  ?  Not on file  ?Social History Narrative  ? Not on file  ? ?Social Determinants of Health  ? ?Financial Resource Strain: High Risk  ? Difficulty of Paying Living Expenses: Hard  ?Food Insecurity: No Food Insecurity  ? Worried About Charity fundraiser in the Last Year: Never true  ? Ran Out of Food in the Last Year: Never true  ?Transportation Needs: No Transportation Needs  ? Lack of Transportation (Medical): No  ? Lack of Transportation (Non-Medical): No  ?Physical Activity: Not on file  ?Stress: Not on file  ?Social Connections: Not on file  ?Intimate Partner Violence: Not on file  ? ?Family History  ?Problem Relation Age of Onset  ? Diabetes Father   ? ?BP 130/88   Pulse 75   Wt 69 kg (152 lb 3.2 oz)   SpO2 98%   BMI 24.57 kg/m?  ? ?Wt Readings from Last 3 Encounters:  ?07/06/21 69 kg (152 lb 3.2 oz)  ?06/06/21 67 kg (147 lb 12.8 oz)  ?05/24/21 72.1 kg (159 lb)  ? ?PHYSICAL EXAM: ?General:  Well appearing. No resp difficulty ?HEENT: normal ?Neck: supple. no JVD. Carotids 2+ bilat; no bruits. No lymphadenopathy or thryomegaly appreciated. ?Cor: PMI nondisplaced. Regular rate & rhythm. No rubs, gallops or murmurs. ?Lungs: clear ?Abdomen: soft, nontender, nondistended. No hepatosplenomegaly. No bruits or masses. Good bowel sounds. ?Extremities: no cyanosis, clubbing, rash, edema ?Neuro: alert & orientedx3, cranial nerves grossly intact. moves all 4 extremities w/o difficulty. Affect pleasant ? ? ?ASSESSMENT & PLAN: ? ?1. HFrEF, NICM  ?- Echo (3/23): EF 25-30% Small LV thrombus. No cMRI with no insurance. ?- Cath(3/23): normal cors and stable hemodynamics. ?- Echo today 07/06/21: EF 40-45% RV normal ?- Improved NYHA I ?- Continue carvedilol 3.125 mg bid. ?- Increase losartan to 50 daily Will not switch to ARNI with improving LV and cost issues ?- Continue spironolactone 12.5 mg daily.   ?- Continue Jardiance 10 mg daily. ?  ?2. LV Thrombus ?- Resolved with improvement in LV function ?- Stop Eliquis  ?  ?3. Cocaine  Abuse/Tobacco abuse ?- Says he is not using cocaine ?- Remains on 1/2 ppd. Discussed need to quit ?  ?4. DMII ?- Per PCP.  ?- Continue SGLT2i  ?  ?5. Prolonged QT  ?- Repeat ECG ? ?6. HTN  ?- BP up a little.  ?- increase losartan ? ? ?Glori Bickers, MD  ?4:25 PM ? ? ?

## 2021-07-19 ENCOUNTER — Observation Stay (HOSPITAL_COMMUNITY)
Admission: EM | Admit: 2021-07-19 | Discharge: 2021-07-20 | Discharge status: Against Medical Advice | Payer: Medicaid Other | Attending: Internal Medicine | Admitting: Internal Medicine

## 2021-07-19 ENCOUNTER — Encounter (HOSPITAL_COMMUNITY): Payer: Self-pay | Admitting: Emergency Medicine

## 2021-07-19 ENCOUNTER — Other Ambulatory Visit: Payer: Self-pay

## 2021-07-19 ENCOUNTER — Emergency Department (HOSPITAL_COMMUNITY): Payer: Medicaid Other

## 2021-07-19 DIAGNOSIS — R55 Syncope and collapse: Secondary | ICD-10-CM | POA: Diagnosis not present

## 2021-07-19 DIAGNOSIS — Z794 Long term (current) use of insulin: Secondary | ICD-10-CM | POA: Insufficient documentation

## 2021-07-19 DIAGNOSIS — F1410 Cocaine abuse, uncomplicated: Secondary | ICD-10-CM | POA: Diagnosis not present

## 2021-07-19 DIAGNOSIS — Z7901 Long term (current) use of anticoagulants: Secondary | ICD-10-CM | POA: Diagnosis not present

## 2021-07-19 DIAGNOSIS — F17210 Nicotine dependence, cigarettes, uncomplicated: Secondary | ICD-10-CM | POA: Diagnosis not present

## 2021-07-19 DIAGNOSIS — Z7984 Long term (current) use of oral hypoglycemic drugs: Secondary | ICD-10-CM | POA: Insufficient documentation

## 2021-07-19 DIAGNOSIS — N179 Acute kidney failure, unspecified: Secondary | ICD-10-CM | POA: Insufficient documentation

## 2021-07-19 DIAGNOSIS — E119 Type 2 diabetes mellitus without complications: Secondary | ICD-10-CM | POA: Diagnosis not present

## 2021-07-19 DIAGNOSIS — Z20822 Contact with and (suspected) exposure to covid-19: Secondary | ICD-10-CM | POA: Diagnosis not present

## 2021-07-19 DIAGNOSIS — E860 Dehydration: Secondary | ICD-10-CM | POA: Insufficient documentation

## 2021-07-19 DIAGNOSIS — I5020 Unspecified systolic (congestive) heart failure: Secondary | ICD-10-CM | POA: Diagnosis present

## 2021-07-19 DIAGNOSIS — I110 Hypertensive heart disease with heart failure: Secondary | ICD-10-CM | POA: Diagnosis not present

## 2021-07-19 DIAGNOSIS — I152 Hypertension secondary to endocrine disorders: Secondary | ICD-10-CM | POA: Diagnosis present

## 2021-07-19 DIAGNOSIS — R778 Other specified abnormalities of plasma proteins: Secondary | ICD-10-CM | POA: Insufficient documentation

## 2021-07-19 DIAGNOSIS — Z79899 Other long term (current) drug therapy: Secondary | ICD-10-CM | POA: Diagnosis not present

## 2021-07-19 DIAGNOSIS — F1910 Other psychoactive substance abuse, uncomplicated: Secondary | ICD-10-CM | POA: Diagnosis present

## 2021-07-19 DIAGNOSIS — R7989 Other specified abnormal findings of blood chemistry: Secondary | ICD-10-CM | POA: Diagnosis not present

## 2021-07-19 LAB — CBC WITH DIFFERENTIAL/PLATELET
Abs Immature Granulocytes: 0.03 10*3/uL (ref 0.00–0.07)
Basophils Absolute: 0 10*3/uL (ref 0.0–0.1)
Basophils Relative: 0 %
Eosinophils Absolute: 0 10*3/uL (ref 0.0–0.5)
Eosinophils Relative: 0 %
HCT: 50 % (ref 39.0–52.0)
Hemoglobin: 16 g/dL (ref 13.0–17.0)
Immature Granulocytes: 0 %
Lymphocytes Relative: 23 %
Lymphs Abs: 2.4 10*3/uL (ref 0.7–4.0)
MCH: 26.9 pg (ref 26.0–34.0)
MCHC: 32 g/dL (ref 30.0–36.0)
MCV: 84 fL (ref 80.0–100.0)
Monocytes Absolute: 1.1 10*3/uL — ABNORMAL HIGH (ref 0.1–1.0)
Monocytes Relative: 10 %
Neutro Abs: 6.8 10*3/uL (ref 1.7–7.7)
Neutrophils Relative %: 67 %
Platelets: 347 10*3/uL (ref 150–400)
RBC: 5.95 MIL/uL — ABNORMAL HIGH (ref 4.22–5.81)
RDW: 15 % (ref 11.5–15.5)
WBC: 10.3 10*3/uL (ref 4.0–10.5)
nRBC: 0 % (ref 0.0–0.2)

## 2021-07-19 LAB — CBG MONITORING, ED
Glucose-Capillary: 125 mg/dL — ABNORMAL HIGH (ref 70–99)
Glucose-Capillary: 139 mg/dL — ABNORMAL HIGH (ref 70–99)

## 2021-07-19 LAB — I-STAT VENOUS BLOOD GAS, ED
Acid-base deficit: 2 mmol/L (ref 0.0–2.0)
Bicarbonate: 25 mmol/L (ref 20.0–28.0)
Calcium, Ion: 1.05 mmol/L — ABNORMAL LOW (ref 1.15–1.40)
HCT: 48 % (ref 39.0–52.0)
Hemoglobin: 16.3 g/dL (ref 13.0–17.0)
O2 Saturation: 66 %
Potassium: 3.9 mmol/L (ref 3.5–5.1)
Sodium: 134 mmol/L — ABNORMAL LOW (ref 135–145)
TCO2: 26 mmol/L (ref 22–32)
pCO2, Ven: 48.5 mmHg (ref 44–60)
pH, Ven: 7.321 (ref 7.25–7.43)
pO2, Ven: 38 mmHg (ref 32–45)

## 2021-07-19 LAB — URINALYSIS, ROUTINE W REFLEX MICROSCOPIC
Bacteria, UA: NONE SEEN
Bilirubin Urine: NEGATIVE
Glucose, UA: >=500 mg/dL — AB
Hgb urine dipstick: NEGATIVE
Ketones, ur: 5 mg/dL — AB
Leukocytes,Ua: NEGATIVE
Nitrite: NEGATIVE
Protein, ur: NEGATIVE mg/dL
Specific Gravity, Urine: 1.015 (ref 1.005–1.030)
pH: 5 (ref 5.0–8.0)

## 2021-07-19 LAB — LIPASE, BLOOD: Lipase: 23 U/L (ref 11–51)

## 2021-07-19 LAB — RESP PANEL BY RT-PCR (FLU A&B, COVID) ARPGX2
Influenza A by PCR: NEGATIVE
Influenza B by PCR: NEGATIVE
SARS Coronavirus 2 by RT PCR: NEGATIVE

## 2021-07-19 LAB — COMPREHENSIVE METABOLIC PANEL
ALT: 16 U/L (ref 0–44)
AST: 28 U/L (ref 15–41)
Albumin: 3.3 g/dL — ABNORMAL LOW (ref 3.5–5.0)
Alkaline Phosphatase: 41 U/L (ref 38–126)
Anion gap: 9 (ref 5–15)
BUN: 45 mg/dL — ABNORMAL HIGH (ref 6–20)
CO2: 22 mmol/L (ref 22–32)
Calcium: 8.5 mg/dL — ABNORMAL LOW (ref 8.9–10.3)
Chloride: 103 mmol/L (ref 98–111)
Creatinine, Ser: 2.56 mg/dL — ABNORMAL HIGH (ref 0.61–1.24)
GFR, Estimated: 32 mL/min — ABNORMAL LOW (ref >60)
Glucose, Bld: 85 mg/dL (ref 70–99)
Potassium: 4 mmol/L (ref 3.5–5.1)
Sodium: 134 mmol/L — ABNORMAL LOW (ref 135–145)
Total Bilirubin: 0.9 mg/dL (ref 0.3–1.2)
Total Protein: 6.4 g/dL — ABNORMAL LOW (ref 6.5–8.1)

## 2021-07-19 LAB — RAPID URINE DRUG SCREEN, HOSP PERFORMED
Amphetamines: NOT DETECTED
Barbiturates: NOT DETECTED
Benzodiazepines: NOT DETECTED
Cocaine: POSITIVE — AB
Opiates: POSITIVE — AB
Tetrahydrocannabinol: NOT DETECTED

## 2021-07-19 LAB — TROPONIN I (HIGH SENSITIVITY)
Troponin I (High Sensitivity): 289 ng/L (ref <18)
Troponin I (High Sensitivity): 298 ng/L (ref <18)
Troponin I (High Sensitivity): 228 ng/L (ref <18)

## 2021-07-19 LAB — TSH: TSH: 1.005 u[IU]/mL (ref 0.350–4.500)

## 2021-07-19 LAB — CK: Total CK: 76 U/L (ref 49–397)

## 2021-07-19 LAB — BRAIN NATRIURETIC PEPTIDE: B Natriuretic Peptide: 213.9 pg/mL — ABNORMAL HIGH (ref 0.0–100.0)

## 2021-07-19 LAB — D-DIMER, QUANTITATIVE: D-Dimer, Quant: 0.3 ug/mL-FEU (ref 0.00–0.50)

## 2021-07-19 LAB — ETHANOL: Alcohol, Ethyl (B): <10 mg/dL (ref <10)

## 2021-07-19 MED ORDER — ONDANSETRON HCL 4 MG/2ML IJ SOLN
4.0000 mg | Freq: Once | INTRAMUSCULAR | Status: AC
  Administered 2021-07-19: 4 mg via INTRAVENOUS
  Filled 2021-07-19: qty 2

## 2021-07-19 MED ORDER — SODIUM CHLORIDE 0.9 % IV SOLN: Freq: Once | INTRAVENOUS | Status: AC

## 2021-07-19 MED ORDER — FAMOTIDINE IN NACL 20-0.9 MG/50ML-% IV SOLN
20.0000 mg | Freq: Once | INTRAVENOUS | Status: AC
  Administered 2021-07-19: 20 mg via INTRAVENOUS
  Filled 2021-07-19: qty 50

## 2021-07-19 MED ORDER — SODIUM CHLORIDE 0.9 % IV BOLUS
1000.0000 mL | Freq: Once | INTRAVENOUS | Status: AC
  Administered 2021-07-19: 1000 mL via INTRAVENOUS

## 2021-07-19 NOTE — ED Notes (Signed)
Phlebotomy at bedside at this time to attempt to obtain lab work  ?

## 2021-07-19 NOTE — H&P (Signed)
?History and Physical  ? ? ?David [name] DOB: 06-04-1980 DOA: 07/19/2021 ? ?PCP: Kerin Perna, NP  ?Patient coming from: Home ? ?I have personally briefly reviewed patient's old medical records in [place] ? ?Chief Complaint: Syncope ? ?HPI: ?David [name] is a 41 y.o. male with medical history significant for HFrEF (EF 40-45% by TTE 07/06/2021) NICM, LV thrombus s/p Eliquis treatment now off anticoagulation, T2DM, HTN, cocaine, tobacco, alcohol use who presented to the ED for evaluation after syncopal episode. ? ?Patient states that he was having a couple days of nausea and vomiting over the weekend.  He did not have any loose stools/diarrhea.  He noticed decreased urine output than expected.  Earlier today 5/16 he was picked up his kids from school.  He says he was in a squatting position when he stood up and started walking.  He says he became lightheaded and then passed out falling to the ground.  He did not have any injury.  He awoke after short period of time without confusion. ? ?Of note, patient recently saw his heart failure specialist on 5/3.  At that time he was noted to have elevated blood pressure and his losartan was increased from 25 mg daily to 50 mg daily.  TTE that same day showed EF 40-45%, normal LV diastolic parameters. ? ?Patient otherwise denies any chest pain, dyspnea, abdominal pain, focal weakness.  He states he is still smoking cigarettes, down from 1 pack a day to half a pack.  He does report daily alcohol use with last drink 2 days ago.  He denies any history of withdrawal or current withdrawal symptoms. ? ?ED Course  Labs/Imaging on admission: I have personally reviewed following labs and imaging studies. ? ?Initial vitals showed BP 84/53, pulse 121, RR 19, temp 97.9 ?F, SPO2 98% on room air. ? ?Labs show BUN 45, creatinine 2.56 (baseline around 1.0), serum glucose 85, sodium 134, potassium 4.0, bicarb 22, LFTs within normal limits, WBC 10.3, hemoglobin  16.0, platelets 347,000, troponin 298 > 228, D-dimer 0.30, TSH 1.005, BNP 213.9, lipase 23, CK 76, serum ethanol <10. ? ?Urinalysis negative for UTI.  UDS positive for cocaine and opiates. ? ?CT head without contrast negative for acute intracranial process. ? ?Portable chest x-ray negative for focal consolidation, edema, effusion. ? ?Patient was given total 2 L normal saline.  Cardiology were consulted and have evaluated patient.  The hospitalist service was consulted to admit for further evaluation and management. ? ?Review of Systems: All systems reviewed and are negative except as documented in history of present illness above. ? ? ?Past Medical History:  ?Diagnosis Date  ? Diabetes mellitus ([place])   ? Hypertension   ? Metacarpal bone fracture 07/11/2015  ? right small  ? Neuropathy   ? 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 ? ?Past Surgical History:  ?Procedure Laterality Date  ? ESOPHAGOGASTRODUODENOSCOPY N/A 09/22/2020  ? Procedure: ESOPHAGOGASTRODUODENOSCOPY (EGD);  Surgeon: Wilford Corner, MD;  Location: [facility];  Service: Endoscopy;  Laterality: N/A;  ? NO PAST SURGERIES    ? RIGHT/LEFT HEART CATH AND CORONARY ANGIOGRAPHY N/A 05/16/2021  ? Procedure: RIGHT/LEFT HEART CATH AND CORONARY ANGIOGRAPHY;  Surgeon: Jettie Booze, MD;  Location: [facility];  Service: Cardiovascular;  Laterality: N/A;  ? ? ?Social History: ? reports that he has been smoking cigarettes. He has a 10.00 pack-year smoking history. He has never used smokeless tobacco.  He reports current alcohol use of about 30.0 standard drinks per week. He reports current drug use. Drugs: Oxycodone and Marijuana. ? ?No Known Allergies ? ?Family History  ?Problem Relation Age of Onset  ? Diabetes Father   ? ? ? ?Prior to Admission medications   ?Medication Sig Start Date End Date Taking? Authorizing Provider  ?acetaminophen (TYLENOL) 500 MG tablet Take 500 mg by mouth every 6 (six) hours as needed  for moderate pain.    [provider]  ?blood glucose meter kit and supplies KIT Dispense based on patient and insurance preference. Use up to four times daily as directed. 05/17/21   Kayleen Memos, DO  ?carvedilol (COREG) 3.125 MG tablet Take 1 tablet (3.125 mg total) by mouth 2 (two) times daily with a meal. 05/24/21   Clegg, Amy D, NP  ?empagliflozin (JARDIANCE) 10 MG TABS tablet Take 1 tablet (10 mg total) by mouth daily before breakfast. 06/16/21   Milford, Maricela Bo, FNP  ?folic acid (FOLVITE) 1 MG tablet Take 1 tablet (1 mg total) by mouth daily. 05/17/21 08/15/21  Kayleen Memos, DO  ?insulin aspart (NOVOLOG) 100 UNIT/ML injection For blood sugars 0-150 give 0 units of insulin, 200-250 give 4 units of insulin, 251-300 give 6 units, 301-350 give 8 units, 351- 400 give 10 units, > 400 give 12 units and call M.D. ?Discussed hypoglycemia protocol. 01/13/21   Kerin Perna, NP  ?Insulin Pen Needle (PEN NEEDLES 31GX5/16") 31G X 8 MM MISC As directed 08/16/20   Kathie Dike, MD  ?losartan (COZAAR) 50 MG tablet Take 1 tablet (50 mg total) by mouth daily. 07/06/21   Bensimhon, Shaune Pascal, MD  ?ondansetron (ZOFRAN) 4 MG tablet Take 1 tablet (4 mg total) by mouth every 8 (eight) hours as needed for nausea or vomiting. 06/21/21   Tegeler, Gwenyth Allegra, MD  ?pantoprazole (PROTONIX) 40 MG tablet Take 1 tablet (40 mg total) by mouth daily. 05/17/21 07/07/22  Kayleen Memos, DO  ?polyethylene glycol (MIRALAX / GLYCOLAX) 17 g packet Take 17 g by mouth daily as needed for mild constipation. 05/17/21   Kayleen Memos, DO  ?spironolactone (ALDACTONE) 25 MG tablet Take 0.5 tablets (12.5 mg total) by mouth daily. 06/16/21   Rafael Bihari, FNP  ?thiamine 100 MG tablet Take 1 tablet (100 mg total) by mouth daily. 05/17/21 08/15/21  Kayleen Memos, DO  ? ? ?Physical Exam: ?Vitals:  ? 07/19/21 2310 07/19/21 2330 07/20/21 0015 07/20/21 0030  ?BP: 93/65 (!) 91/57 92/65 118/79  ?Pulse: 97 88 81 82  ?Resp: _0 ?Temp:       ?TempSrc:      ?SpO2: 96% 96% 97% 97%  ?Weight:      ?Height:      ? ?Constitutional: Resting in bed, NAD, calm, comfortable ?Eyes: EOMI, lids and conjunctivae normal ?ENMT: Mucous membranes are dry. Posterior pharynx clear of any exudate or lesions.poor dentition.  ?Neck: normal, supple, no masses. ?Respiratory: clear to auscultation bilaterally, no wheezing, no crackles. Normal respiratory effort. No accessory muscle use.  ?Cardiovascular: Regular rate and rhythm, no murmurs / rubs / gallops. No extremity edema. 2+ pedal pulses. ?Abdomen: no tenderness, no masses palpated. No hepatosplenomegaly.  ?Musculoskeletal: no clubbing / cyanosis. No joint deformity upper and lower extremities. Good ROM, no contractures. Normal muscle tone.  ?Skin: no rashes, lesions, ulcers. No induration ?Neurologic: Sensation intact. Strength 5/5 in all 4.  ?Psychiatric:  Alert and oriented x 3. Normal mood.  ? ?  EKG: Personally reviewed. Sinus tach, rate 124, L PFB, LVH.  Rate is faster, high amplitude T wave changes new compared to prior. ? ?Assessment/Plan ?Principal Problem: ?  Syncope ?Active Problems: ?  AKI (acute kidney injury) ([place]) ?  Elevated troponin ?  HFrEF (heart failure with reduced ejection fraction) ([place]) ?  Hypertension associated with diabetes ([place]) ?  Type 2 diabetes mellitus without complication, with long-term current use of insulin ([place]) ?  Polysubstance abuse ([place]) ?  ?David [name] is a 41 y.o. male with medical history significant for HFrEF (EF 40-45% by TTE 07/06/2021) NICM, LV thrombus s/p Eliquis treatment now off anticoagulation, T2DM, HTN, cocaine, tobacco, alcohol use who is admitted for evaluation of syncope and AKI. ? ?Assessment and Plan: ?* Syncope ?Likely orthostatic syncope related to hypovolemia.  CT head negative for acute process. ?-Check orthostatic vitals ?-Continue gentle IV fluid hydration with NS_0  mL/hour overnight ?-Holding home losartan, spironolactone, Jardiance for now ? ?AKI (acute  kidney injury) ([place]) ?Creatinine 2.56 on admission compared to baseline around 1.0.  Likely prerenal secondary to hypovolemia from GI losses plus medication effect. ?-Gentle IV fluid hydration overnig

## 2021-07-19 NOTE — ED Triage Notes (Signed)
Patient reports while picking his children up for school he was walking to his car and had a syncopal episode. States he fell onto grass and has abraison to right side of face. States he was taken off of his blood thinner a couple weeks ago. Reports emesis since Saturday morning.  ?

## 2021-07-19 NOTE — ED Notes (Addendum)
Lab reports to this RN a troponin of 289 to this RN at this time/ provider Dr Wallace Cullens made aware via secure chat ?

## 2021-07-19 NOTE — ED Notes (Signed)
Three attempts for blood draw with no success RN notified. ?

## 2021-07-19 NOTE — ED Notes (Signed)
Provider at bedside at this time

## 2021-07-19 NOTE — ED Provider Notes (Signed)
?[facility] ?Provider Note ? ? ?CSN: [id number] ?Arrival date & time: 07/19/21  1532 ? ?  ? ?History ? ?Chief Complaint  ?Patient presents with  ? Loss of Consciousness  ? ? ?David [name] is a 41 y.o. male. ? ? Patient as above with significant medical history as below, including DM, HTN, seizure a/w etoh use,  who presents to the ED with complaint of syncope.  Patient reports nausea and vomiting for the past 3 days.  He has been tolerating liquids but no solid food intake.  Reduced urination past few days.  No diarrhea.  No fevers, chills, sick contacts or recent travel.  No significant abdominal pain, he does have some cramping when vomiting.  Patient reports he was picking up his child from school around 3:00 he was walking outside and collapsed.  Reports he had some lightheadedness, felt he was dizzy, his legs felt heavy and he fell to the ground.  He was on the ground for around 1 minute he estimates.  He did not feel confused afterwards.  No bowel or bladder incontinence.  No tongue injury.  He has history of provoked seizure in the past associate with alcohol use.  No AEDs.  On arrival to the ED patient feels fatigued, nauseated.  No numbness or tingling.  No headache.  Denies neck pain.  No chest pain or dyspnea.  Patient was hypotensive on arrival 80s over 88s.  Tachycardic. ? ? ? ? ?Past Medical History: ?No date: Diabetes mellitus ([place]) ?No date: Hypertension ?07/11/2015: Metacarpal bone fracture ?    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    Comment:  states seizure was due to alcohol  ? ?Past Surgical History: ?09/22/2020: ESOPHAGOGASTRODUODENOSCOPY; N/A ?    Comment:  Procedure: ESOPHAGOGASTRODUODENOSCOPY (EGD);  Surgeon:  ?             Wilford Corner, MD;  Location: [facility];  Service: ?             Endoscopy;  Laterality: N/A; ?No date: NO PAST SURGERIES ?05/16/2021: RIGHT/LEFT HEART CATH AND CORONARY  ANGIOGRAPHY; N/A ?    Comment:  Procedure: RIGHT/LEFT HEART CATH AND CORONARY  ?             ANGIOGRAPHY;  Surgeon: Jettie Booze, MD;   ?             Location: [facility];  Service: Cardiovascular;   ?             Laterality: N/A;  ? ? ?The history is provided by the patient. No language interpreter was used.  ?Loss of Consciousness ?Associated symptoms: nausea and vomiting   ?Associated symptoms: no chest pain, no confusion, no fever, no headaches, no palpitations and no shortness of breath   ? ?  ? ?Home Medications ?Prior to Admission medications   ?Medication Sig Start Date End Date Taking? Authorizing Provider  ?acetaminophen (TYLENOL) 500 MG tablet Take 500 mg by mouth every 6 (six) hours as needed for moderate pain.    [provider]  ?blood glucose meter kit and supplies KIT Dispense based on patient and insurance preference. Use up to four times daily as directed. 05/17/21   Kayleen Memos, DO  ?carvedilol (COREG) 3.125 MG tablet Take 1 tablet (3.125 mg total) by mouth 2 (two) times daily with a meal. 05/24/21   Clegg, Amy D, NP  ?empagliflozin (JARDIANCE)  10 MG TABS tablet Take 1 tablet (10 mg total) by mouth daily before breakfast. 06/16/21   Milford, Maricela Bo, FNP  ?folic acid (FOLVITE) 1 MG tablet Take 1 tablet (1 mg total) by mouth daily. 05/17/21 08/15/21  Kayleen Memos, DO  ?insulin aspart (NOVOLOG) 100 UNIT/ML injection For blood sugars 0-150 give 0 units of insulin, 200-250 give 4 units of insulin, 251-300 give 6 units, 301-350 give 8 units, 351- 400 give 10 units, > 400 give 12 units and call M.D. ?Discussed hypoglycemia protocol. 01/13/21   Kerin Perna, NP  ?Insulin Pen Needle (PEN NEEDLES 31GX5/16") 31G X 8 MM MISC As directed 08/16/20   Kathie Dike, MD  ?losartan (COZAAR) 50 MG tablet Take 1 tablet (50 mg total) by mouth daily. 07/06/21   Bensimhon, Shaune Pascal, MD  ?ondansetron (ZOFRAN) 4 MG tablet Take 1 tablet (4 mg total) by mouth every 8 (eight) hours as needed  for nausea or vomiting. 06/21/21   Tegeler, Gwenyth Allegra, MD  ?pantoprazole (PROTONIX) 40 MG tablet Take 1 tablet (40 mg total) by mouth daily. 05/17/21 07/07/22  Kayleen Memos, DO  ?polyethylene glycol (MIRALAX / GLYCOLAX) 17 g packet Take 17 g by mouth daily as needed for mild constipation. 05/17/21   Kayleen Memos, DO  ?spironolactone (ALDACTONE) 25 MG tablet Take 0.5 tablets (12.5 mg total) by mouth daily. 06/16/21   Rafael Bihari, FNP  ?thiamine 100 MG tablet Take 1 tablet (100 mg total) by mouth daily. 05/17/21 08/15/21  Kayleen Memos, DO  ?   ? ?Allergies    ?Patient has no known allergies.   ? ?Review of Systems   ?Review of Systems  ?Constitutional:  Positive for fatigue. Negative for chills and fever.  ?HENT:  Negative for facial swelling and trouble swallowing.   ?Eyes:  Negative for photophobia and visual disturbance.  ?Respiratory:  Negative for cough and shortness of breath.   ?Cardiovascular:  Positive for syncope. Negative for chest pain and palpitations.  ?Gastrointestinal:  Positive for nausea and vomiting. Negative for abdominal pain.  ?Endocrine: Negative for polydipsia and polyuria.  ?Genitourinary:  Negative for difficulty urinating and hematuria.  ?Musculoskeletal:  Negative for gait problem and joint swelling.  ?Skin:  Negative for pallor and rash.  ?Neurological:  Positive for syncope. Negative for headaches.  ?Psychiatric/Behavioral:  Negative for agitation and confusion.   ? ?Physical Exam ?Updated Vital Signs ?BP (!) 91/57   Pulse 88   Temp 97.9 ?F (36.6 ?C) (Oral)   Resp 16   Ht _0  (1.676 m)   Wt 67.1 kg   SpO2 96%   BMI 23.89 kg/m?  ?Physical Exam ?Vitals and nursing note reviewed.  ?Constitutional:   ?   General: He is not in acute distress. ?   Appearance: Normal appearance. He is well-developed. He is not ill-appearing.  ?HENT:  ?   Head: Normocephalic and atraumatic.  ?   Comments: Dried grass on forehead ?   Right Ear: External ear normal.  ?   Left Ear: External ear  normal.  ?   Mouth/Throat:  ?   Mouth: Mucous membranes are moist.  ?Eyes:  ?   General: No visual field deficit or scleral icterus. ?   Extraocular Movements: Extraocular movements intact.  ?   Conjunctiva/sclera: Conjunctivae normal.  ?   Pupils: Pupils are equal, round, and reactive to light.  ?Cardiovascular:  ?   Rate and Rhythm: Normal rate and regular rhythm.  ?  Pulses: Normal pulses.  ?   Heart sounds: Normal heart sounds. No murmur heard. ?Pulmonary:  ?   Effort: Pulmonary effort is normal. No respiratory distress.  ?   Breath sounds: Normal breath sounds.  ?Abdominal:  ?   General: Abdomen is flat.  ?   Palpations: Abdomen is soft.  ?   Tenderness: There is no abdominal tenderness.  ?Musculoskeletal:     ?   General: Normal range of motion.  ?   Cervical back: Full passive range of motion without pain and normal range of motion.  ?   Right lower leg: No edema.  ?   Left lower leg: No edema.  ?   Comments: No midline spinous process tenderness to palpation or percussion, no crepitus or step-off.   ?  ?Skin: ?   General: Skin is warm and dry.  ?   Capillary Refill: Capillary refill takes less than 2 seconds.  ?Neurological:  ?   Mental Status: He is alert and oriented to person, place, and time.  ?   GCS: GCS eye subscore is 4. GCS verbal subscore is 5. GCS motor subscore is 6.  ?   Cranial Nerves: Cranial nerves 2-12 are intact. No facial asymmetry.  ?   Sensory: Sensation is intact.  ?   Motor: Motor function is intact. No tremor.  ?   Coordination: Coordination is intact.  ?Psychiatric:     ?   Mood and Affect: Mood normal.     ?   Behavior: Behavior normal.  ? ? ?ED Results / Procedures / Treatments   ?Labs ?(all labs ordered are listed, but only abnormal results are displayed) ?Labs Reviewed  ?CBC WITH DIFFERENTIAL/PLATELET - Abnormal; Notable for the following components:  ?    Result Value  ? RBC 5.95 (*)   ? Monocytes Absolute 1.1 (*)   ? All other components within normal limits  ?COMPREHENSIVE  METABOLIC PANEL - Abnormal; Notable for the following components:  ? Sodium 134 (*)   ? BUN 45 (*)   ? Creatinine, Ser 2.56 (*)   ? Calcium 8.5 (*)   ? Total Protein 6.4 (*)   ? Albumin 3.3 (*)   ? GFR, Estimated

## 2021-07-19 NOTE — ED Notes (Signed)
Patient transported to CT 

## 2021-07-19 NOTE — ED Notes (Signed)
Reya from the lab reports that 2 troponin test were sent down at the same time thus resulting the same. This RN called to ensure that the troponin that was just received is run as the 2nd test to trend with the first resulted troponin level.  ?

## 2021-07-19 NOTE — Consult Note (Signed)
?Cardiology Consultation:  ? ?Patient ID: David [name] ?MRN: [id number]; DOB: [birth year] ? ?Admit date: 07/19/2021 ?Date of Consult: 07/19/2021 ? ?PCP:  Grayce Sessions, NP ?  ?[facility] Providers ?Cardiologist:  Jodelle Red, MD   { ? ? ?Patient Profile:  ? ?David [name] is a 41 y.o. male with a h/o HFrEF (LVEF 40-45% 07/2021), NICM (clean cors, LHC 05/2021), GI bleed, gastritis, cocaine abuse, DMII, hx of LV thrombus off eliquis and hx of syncope who is being seen 07/19/2021 for the evaluation of elevated troponin at the request of Dr. Wallace Cullens. ? ?History of Present Illness:  ? ?Mr. Swagerty presented with c/o syncopal events. He says he was picking up his children from school. He squatted down, when he got up, he felt dizzy, lightheaded and then passed out. It happened one time yesterday as well in a similar scenario. He reports he started having multiple episodes of nausea and vomiting since this past Sat/Sun due to some "honey". He has not been eating or drinking well for the past 3 days. Reports Stable weights and compliant with his medications. Denies fever, chills, cough, chest discomfort, heart palpitations, dyspnea, PND, abdominal fullness, dysuria, diarrhea, pedal edema or any bleeding events. Currently he is symptomatic free, on room air, no acute distress, reports hungry. ? 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  ? ?On admission, ?Cr 2.56 (was 0.98 07/06/21), BUN 45, Na 134, BNP 213.9 (was 07/06/21), troponin 298->228, Hgb 16 (was 13.3 07/05/21) ?CT head no acute intracranial process ?CXR no active disease ?ECG ST, LVH with strain pattern, LPFB ? ?Past Medical History:  ?Diagnosis Date  ? Diabetes mellitus (HCC)   ? Hypertension   ? Metacarpal bone fracture 07/11/2015  ? right small  ? Neuropathy   ? Seizure with provoking factor (HCC)   ? 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 ? ?Past Surgical History:  ?Procedure Laterality Date  ? ESOPHAGOGASTRODUODENOSCOPY N/A  09/22/2020  ? Procedure: ESOPHAGOGASTRODUODENOSCOPY (EGD);  Surgeon: Charlott Rakes, MD;  Location: [facility];  Service: Endoscopy;  Laterality: N/A;  ? NO PAST SURGERIES    ? RIGHT/LEFT HEART CATH AND CORONARY ANGIOGRAPHY N/A 05/16/2021  ? Procedure: RIGHT/LEFT HEART CATH AND CORONARY ANGIOGRAPHY;  Surgeon: Corky Crafts, MD;  Location: [facility] CV LAB;  Service: Cardiovascular;  Laterality: N/A;  ?  ? ?Inpatient Medications: ?Scheduled Meds: ? ?Continuous Infusions: ? ?PRN Meds: ? ? ?Allergies:   No Known Allergies ? ?Social History:   ?Social History  ? ?Socioeconomic History  ? Marital status: Married  ?  Spouse name: Not on file  ? Number of children: Not on file  ? Years of education: Not on file  ? Highest education level: Not on file  ?Occupational History  ? Not on file  ?Tobacco Use  ? Smoking status: Every Day  ?  Packs/day: 1.00  ?  Years: 10.00  ?  Pack years: 10.00  ?  Types: Cigarettes  ? Smokeless tobacco: Never  ?Vaping Use  ? Vaping Use: Never used  ?Substance and Sexual Activity  ? Alcohol use: Yes  ?  Alcohol/week: 30.0 standard drinks  ?  Types: 30 Shots of liquor per week  ?  Comment: minimum 4 shots of liquor daily  ? Drug use: Yes  ?  Types: Oxycodone, Marijuana  ? Sexual activity: Yes  ?  Birth control/protection: Condom  ?Other Topics Concern  ? Not on file  ?Social History Narrative  ? Not  on file  ? ?Social Determinants of Health  ? ?Financial Resource Strain: High Risk  ? Difficulty of Paying Living Expenses: Hard  ?Food Insecurity: No Food Insecurity  ? Worried About Programme researcher, broadcasting/film/video in the Last Year: Never true  ? Ran Out of Food in the Last Year: Never true  ?Transportation Needs: No Transportation Needs  ? Lack of Transportation (Medical): No  ? Lack of Transportation (Non-Medical): No  ?Physical Activity: Not on file  ?Stress: Not on file  ?Social Connections: Not on file  ?Intimate Partner Violence: Not on file  ?  ?Family History:   ? ?Family History  ?Problem  Relation Age of Onset  ? Diabetes Father   ?  ? ?ROS:  ?Please see the history of present illness.  ? ?All other ROS reviewed and negative.    ? ?Physical Exam/Data:  ? ?Vitals:  ? 07/19/21 1940 07/19/21 2000 07/19/21 2015 07/19/21 2045  ?BP: (!) 126/93 119/81 108/71 116/85  ?Pulse: 83 73 74 85  ?Resp: 15 18 15 18   ?Temp:      ?TempSrc:      ?SpO2: 99% 100% 99% 97%  ?Weight:      ?Height:      ? ? ?Intake/Output Summary (Last 24 hours) at 07/19/2021 2136 ?Last data filed at 07/19/2021 1902 ?Gross per 24 hour  ?Intake 2042.67 ml  ?Output --  ?Net 2042.67 ml  ? ? ?  07/19/2021  ?  3:46 PM 07/06/2021  ?  4:06 PM 06/06/2021  ?  3:19 PM  ?Last 3 Weights  ?Weight (lbs) 148 lb 152 lb 3.2 oz 147 lb 12.8 oz  ?Weight (kg) 67.132 kg 69.037 kg 67.042 kg  ?   ?Body mass index is 23.89 kg/m?.  ?General:  Well nourished, well developed, in no acute distress ?HEENT: normal ?Neck: no JVD ?Vascular: No carotid bruits; Distal pulses 2+ bilaterally ?Cardiac:  normal S1, S2; RRR; no murmur  ?Lungs:  clear to auscultation bilaterally, no wheezing, rhonchi or rales  ?Abd: soft, nontender, no hepatomegaly  ?Ext: no edema ?Musculoskeletal:  No deformities, BUE and BLE strength normal and equal ?Skin: warm and dry  ?Neuro:  CNs 2-12 intact, no focal abnormalities noted ?Psych:  Normal affect  ? ?EKG:  The EKG was personally reviewed and demonstrates:   ?Telemetry:  Telemetry was personally reviewed and demonstrates:   ? ?Relevant CV Studies: ? ? ?Laboratory Data: ? ?High Sensitivity Troponin:   ?Recent Labs  ?Lab 06/21/21 ?1453 06/21/21 ?1838 07/19/21 ?1835 07/19/21 ?1943  ?TROPONINIHS 12 15 298*  289* 228*  ?   ?Chemistry ?Recent Labs  ?Lab 07/19/21 ?1835 07/19/21 ?1843  ?NA 134* 134*  ?K 4.0 3.9  ?CL 103  --   ?CO2 22  --   ?GLUCOSE 85  --   ?BUN 45*  --   ?CREATININE 2.56*  --   ?CALCIUM 8.5*  --   ?GFRNONAA 32*  --   ?ANIONGAP 9  --   ?  ?Recent Labs  ?Lab 07/19/21 ?1835  ?PROT 6.4*  ?ALBUMIN 3.3*  ?AST 28  ?ALT 16  ?ALKPHOS 41  ?BILITOT 0.9   ? ?Lipids No results for input(s): CHOL, TRIG, HDL, LABVLDL, LDLCALC, CHOLHDL in the last 168 hours.  ?Hematology ?Recent Labs  ?Lab 07/19/21 ?1835 07/19/21 ?1843  ?WBC 10.3  --   ?RBC 5.95*  --   ?HGB 16.0 16.3  ?HCT 50.0 48.0  ?MCV 84.0  --   ?MCH 26.9  --   ?MCHC  32.0  --   ?RDW 15.0  --   ?PLT 347  --   ? ?Thyroid  ?Recent Labs  ?Lab 07/19/21 ?1835  ?TSH 1.005  ?  ?BNP ?Recent Labs  ?Lab 07/19/21 ?1835  ?BNP 213.9*  ?  ?DDimer  ?Recent Labs  ?Lab 07/19/21 ?1835  ?DDIMER 0.30  ? ? ? ?Radiology/Studies:  ?CT Head Wo Contrast ? ?Result Date: 07/19/2021 ?CLINICAL DATA:  Syncope, fell, right-sided facial abrasion EXAM: CT HEAD WITHOUT CONTRAST TECHNIQUE: Contiguous axial images were obtained from the base of the skull through the vertex without intravenous contrast. RADIATION DOSE REDUCTION: This exam was performed according to the departmental dose-optimization program which includes automated exposure control, adjustment of the mA and/or kV according to patient size and/or use of iterative reconstruction technique. COMPARISON:  05/13/2021 FINDINGS: Brain: No acute infarct or hemorrhage. Lateral ventricles and midline structures are unremarkable. No acute extra-axial fluid collections. No mass effect. Vascular: No hyperdense vessel or unexpected calcification. Skull: Normal. Negative for fracture or focal lesion. Sinuses/Orbits: Polypoid mucosal thickening within the maxillary sinuses. Moderate mucoperiosteal thickening and retained secretions within the sphenoid sinus. Other: None. IMPRESSION: 1. No acute intracranial process. Electronically Signed   By: Sharlet Salina M.D.   On: 07/19/2021 20:38  ? ?DG Chest Portable 1 View ? ?Result Date: 07/19/2021 ?CLINICAL DATA:  Syncope. EXAM: PORTABLE CHEST 1 VIEW COMPARISON:  Chest x-ray 06/21/2021 FINDINGS: The heart size and mediastinal contours are within normal limits. Both lungs are clear. The visualized skeletal structures are unremarkable. IMPRESSION: No active  disease. Electronically Signed   By: Darliss Cheney M.D.   On: 07/19/2021 16:37   ? ? ?Assessment and Plan:  ? ?#Elevated troponins ?-in the setting of AKI, dehydration and hypotension on admission, denies ACS or equi

## 2021-07-19 NOTE — ED Notes (Signed)
David [name] patient's wife [phone] would like to be contacted with any updates ?

## 2021-07-19 NOTE — ED Notes (Signed)
Labs collected by phlebotomist at bedside at this time ?

## 2021-07-19 NOTE — ED Notes (Signed)
Provider at bedside to attempt to obtain USGPIV access so that labs can be collected and sent for testing ?

## 2021-07-19 NOTE — ED Notes (Addendum)
Provider notified via secure chat that the phlebotomist was also unable to obtain lab specimen at this time ?

## 2021-07-19 NOTE — ED Notes (Signed)
Patient reminded of the need for a urine sample/ urinal at the bedside  ?

## 2021-07-19 NOTE — ED Notes (Signed)
This RN along with another staff RN have attempted to collect lab work x2 without success. Provider notified via secure chat ?

## 2021-07-20 ENCOUNTER — Encounter (HOSPITAL_COMMUNITY): Payer: Self-pay | Admitting: Internal Medicine

## 2021-07-20 DIAGNOSIS — I5020 Unspecified systolic (congestive) heart failure: Secondary | ICD-10-CM | POA: Diagnosis not present

## 2021-07-20 DIAGNOSIS — E1159 Type 2 diabetes mellitus with other circulatory complications: Secondary | ICD-10-CM | POA: Diagnosis not present

## 2021-07-20 DIAGNOSIS — R55 Syncope and collapse: Secondary | ICD-10-CM | POA: Diagnosis not present

## 2021-07-20 DIAGNOSIS — F1910 Other psychoactive substance abuse, uncomplicated: Secondary | ICD-10-CM

## 2021-07-20 DIAGNOSIS — N179 Acute kidney failure, unspecified: Secondary | ICD-10-CM | POA: Diagnosis not present

## 2021-07-20 DIAGNOSIS — I152 Hypertension secondary to endocrine disorders: Secondary | ICD-10-CM

## 2021-07-20 LAB — CBG MONITORING, ED
Glucose-Capillary: 94 mg/dL (ref 70–99)
Glucose-Capillary: 118 mg/dL — ABNORMAL HIGH (ref 70–99)

## 2021-07-20 LAB — BASIC METABOLIC PANEL
Anion gap: 8 (ref 5–15)
BUN: 36 mg/dL — ABNORMAL HIGH (ref 6–20)
CO2: 23 mmol/L (ref 22–32)
Calcium: 8 mg/dL — ABNORMAL LOW (ref 8.9–10.3)
Chloride: 105 mmol/L (ref 98–111)
Creatinine, Ser: 1.55 mg/dL — ABNORMAL HIGH (ref 0.61–1.24)
GFR, Estimated: 58 mL/min — ABNORMAL LOW (ref >60)
Glucose, Bld: 105 mg/dL — ABNORMAL HIGH (ref 70–99)
Potassium: 3.2 mmol/L — ABNORMAL LOW (ref 3.5–5.1)
Sodium: 136 mmol/L (ref 135–145)

## 2021-07-20 LAB — MAGNESIUM: Magnesium: 2.1 mg/dL (ref 1.7–2.4)

## 2021-07-20 LAB — CBC
HCT: 40.8 % (ref 39.0–52.0)
Hemoglobin: 13.4 g/dL (ref 13.0–17.0)
MCH: 27.2 pg (ref 26.0–34.0)
MCHC: 32.8 g/dL (ref 30.0–36.0)
MCV: 82.8 fL (ref 80.0–100.0)
Platelets: 301 10*3/uL (ref 150–400)
RBC: 4.93 MIL/uL (ref 4.22–5.81)
RDW: 15.1 % (ref 11.5–15.5)
WBC: 7.7 10*3/uL (ref 4.0–10.5)
nRBC: 0 % (ref 0.0–0.2)

## 2021-07-20 MED ORDER — HEPARIN SODIUM (PORCINE) 5000 UNIT/ML IJ SOLN
5000.0000 [IU] | Freq: Three times a day (TID) | INTRAMUSCULAR | Status: DC
  Administered 2021-07-20: 5000 [IU] via SUBCUTANEOUS
  Filled 2021-07-20: qty 1

## 2021-07-20 MED ORDER — ACETAMINOPHEN 325 MG PO TABS: 650.0000 mg | ORAL_TABLET | Freq: Four times a day (QID) | ORAL | Status: DC | PRN

## 2021-07-20 MED ORDER — SODIUM CHLORIDE 0.9 % IV SOLN: INTRAVENOUS | Status: AC

## 2021-07-20 MED ORDER — INSULIN ASPART 100 UNIT/ML IJ SOLN: 0.0000 [IU] | Freq: Three times a day (TID) | INTRAMUSCULAR | Status: DC

## 2021-07-20 MED ORDER — ACETAMINOPHEN 650 MG RE SUPP: 650.0000 mg | Freq: Four times a day (QID) | RECTAL | Status: DC | PRN

## 2021-07-20 MED ORDER — SENNOSIDES-DOCUSATE SODIUM 8.6-50 MG PO TABS: 1.0000 | ORAL_TABLET | Freq: Every evening | ORAL | Status: DC | PRN

## 2021-07-20 MED ORDER — POTASSIUM CHLORIDE CRYS ER 20 MEQ PO TBCR
40.0000 meq | EXTENDED_RELEASE_TABLET | ORAL | Status: DC
  Administered 2021-07-20: 40 meq via ORAL
  Filled 2021-07-20: qty 2

## 2021-07-20 MED ORDER — CARVEDILOL 3.125 MG PO TABS
3.1250 mg | ORAL_TABLET | Freq: Two times a day (BID) | ORAL | Status: DC
  Administered 2021-07-20: 3.125 mg via ORAL
  Filled 2021-07-20: qty 1

## 2021-07-20 MED ORDER — SODIUM CHLORIDE 0.9% FLUSH
3.0000 mL | Freq: Two times a day (BID) | INTRAVENOUS | Status: DC
  Administered 2021-07-20 (×2): 3 mL via INTRAVENOUS

## 2021-07-20 MED ORDER — HEPARIN SODIUM (PORCINE) 5000 UNIT/ML IJ SOLN: 5000.0000 [IU] | Freq: Three times a day (TID) | INTRAMUSCULAR | Status: DC

## 2021-07-20 NOTE — Progress Notes (Addendum)
? ?Progress Note ? ?Patient Name: David [name] ?Date of Encounter: 07/20/2021 ? ?[facility] Cardiologist: Jodelle Red, MD  ? ?Subjective  ? ?Patient is doing well this AM, denies any chest pain, palpitations, sob. Also denies any of these symptoms at home PTA. Feels better after receiving IV hydration  ? ?Inpatient Medications  ?  ?Scheduled Meds: ? carvedilol  3.125 mg Oral BID WC  ? heparin  5,000 Units Subcutaneous Q8H  ? insulin aspart  0-9 Units Subcutaneous TID WC  ? sodium chloride flush  3 mL Intravenous Q12H  ? ?Continuous Infusions: ? sodium chloride 50 mL/hr at 07/20/21 0208  ? ?PRN Meds: ?acetaminophen **OR** acetaminophen, senna-docusate  ? ?Vital Signs  ?  ?Vitals:  ? 07/20/21 0400 07/20/21 0415 07/20/21 0500 07/20/21 0700  ?BP: 97/72 104/71 93/70 111/70  ?Pulse: 75 71 74 73  ?Resp: 14 16 15 20   ?Temp:      ?TempSrc:      ?SpO2: 99% 98% 97% 100%  ?Weight:      ?Height:      ? ? ?Intake/Output Summary (Last 24 hours) at 07/20/2021 0741 ?Last data filed at 07/19/2021 1902 ?Gross per 24 hour  ?Intake 2042.67 ml  ?Output --  ?Net 2042.67 ml  ? ? ?  07/19/2021  ?  3:46 PM 07/06/2021  ?  4:06 PM 06/06/2021  ?  3:19 PM  ?Last 3 Weights  ?Weight (lbs) 148 lb 152 lb 3.2 oz 147 lb 12.8 oz  ?Weight (kg) 67.132 kg 69.037 kg 67.042 kg  ?   ? ?Telemetry  ?  ?Sinus rhythm - Personally Reviewed ? ?ECG  ?  ?On admission: sinus rhythm, LVH with repolarization abnormality, right atrial enlargement - Personally Reviewed ? ?Physical Exam  ? ?GEN: No acute distress. Laying comfortably in the bed  ?Neck: No JVD ?Cardiac: RRR, no murmurs, rubs, or gallops. Radial pulses 2+ bilaterally  ?Respiratory: Clear to auscultation bilaterally. ?GI: Soft, nontender, non-distended  ?MS: No edema; No deformity. ?Neuro:  Nonfocal  ?Psych: Normal affect  ? ?Labs  ?  ?High Sensitivity Troponin:   ?Recent Labs  ?Lab 06/21/21 ?1453 06/21/21 ?1838 07/19/21 ?1835 07/19/21 ?1943  ?TROPONINIHS 12 15 298*  289* 228*  ?    ?Chemistry ?Recent Labs  ?Lab 07/19/21 ?1835 07/19/21 ?1843 07/20/21 ?07/22/21  ?NA 134* 134* 136  ?K 4.0 3.9 3.2*  ?CL 103  --  105  ?CO2 22  --  23  ?GLUCOSE 85  --  105*  ?BUN 45*  --  36*  ?CREATININE 2.56*  --  1.55*  ?CALCIUM 8.5*  --  8.0*  ?MG  --   --  2.1  ?PROT 6.4*  --   --   ?ALBUMIN 3.3*  --   --   ?AST 28  --   --   ?ALT 16  --   --   ?ALKPHOS 41  --   --   ?BILITOT 0.9  --   --   ?GFRNONAA 32*  --  58*  ?ANIONGAP 9  --  8  ?  ?Lipids No results for input(s): CHOL, TRIG, HDL, LABVLDL, LDLCALC, CHOLHDL in the last 168 hours.  ?Hematology ?Recent Labs  ?Lab 07/19/21 ?1835 07/19/21 ?1843 07/20/21 ?07/22/21  ?WBC 10.3  --  7.7  ?RBC 5.95*  --  4.93  ?HGB 16.0 16.3 13.4  ?HCT 50.0 48.0 40.8  ?MCV 84.0  --  82.8  ?MCH 26.9  --  27.2  ?MCHC 32.0  --  32.8  ?RDW 15.0  --  15.1  ?PLT 347  --  301  ? ?Thyroid  ?Recent Labs  ?Lab 07/19/21 ?1835  ?TSH 1.005  ?  ?BNP ?Recent Labs  ?Lab 07/19/21 ?1835  ?BNP 213.9*  ?  ?DDimer  ?Recent Labs  ?Lab 07/19/21 ?1835  ?DDIMER 0.30  ?  ? ?Radiology  ?  ?CT Head Wo Contrast ? ?Result Date: 07/19/2021 ?CLINICAL DATA:  Syncope, fell, right-sided facial abrasion EXAM: CT HEAD WITHOUT CONTRAST TECHNIQUE: Contiguous axial images were obtained from the base of the skull through the vertex without intravenous contrast. RADIATION DOSE REDUCTION: This exam was performed according to the departmental dose-optimization program which includes automated exposure control, adjustment of the mA and/or kV according to patient size and/or use of iterative reconstruction technique. COMPARISON:  05/13/2021 FINDINGS: Brain: No acute infarct or hemorrhage. Lateral ventricles and midline structures are unremarkable. No acute extra-axial fluid collections. No mass effect. Vascular: No hyperdense vessel or unexpected calcification. Skull: Normal. Negative for fracture or focal lesion. Sinuses/Orbits: Polypoid mucosal thickening within the maxillary sinuses. Moderate mucoperiosteal thickening and retained  secretions within the sphenoid sinus. Other: None. IMPRESSION: 1. No acute intracranial process. Electronically Signed   By: Sharlet Salina M.D.   On: 07/19/2021 20:38  ? ?DG Chest Portable 1 View ? ?Result Date: 07/19/2021 ?CLINICAL DATA:  Syncope. EXAM: PORTABLE CHEST 1 VIEW COMPARISON:  Chest x-ray 06/21/2021 FINDINGS: The heart size and mediastinal contours are within normal limits. Both lungs are clear. The visualized skeletal structures are unremarkable. IMPRESSION: No active disease. Electronically Signed   By: Darliss Cheney M.D.   On: 07/19/2021 16:37   ? ?Cardiac Studies  ? ?Echocardiogram 07/06/21 ?1. Left ventricular ejection fraction, by estimation, is 40 to 45%. The  ?left ventricle has mildly decreased function. The left ventricle has no  ?regional wall motion abnormalities. Left ventricular diastolic parameters  ?were normal.  ? 2. Right ventricular systolic function is normal. The right ventricular  ?size is normal. Tricuspid regurgitation signal is inadequate for assessing  ?PA pressure.  ? 3. The mitral valve is normal in structure. Mild mitral valve  ?regurgitation. No evidence of mitral stenosis.  ? 4. The aortic valve is normal in structure. Aortic valve regurgitation is  ?not visualized. No aortic stenosis is present.  ? 5. The inferior vena cava is normal in size with greater than 50%  ?respiratory variability, suggesting right atrial pressure of 3 mmHg.  ? ?Comparison(s): A prior study was performed on 05/14/2021. Changes from  ?prior study are noted. The left ventricular function has improved.  ? ?Patient Profile  ?   ?41 y.o. male with a h/o HFrEF (LVEF 40-45% 07/2021), NICM (clean cors, LHC 05/2021), GI bleed, gastritis, cocaine abuse, DMII, hx of LV thrombus off eliquis and hx of syncope who was seen 07/19/2021 for the evaluation of elevated troponin at the request of Dr. Wallace Cullens. ? ?Assessment & Plan  ?  ?Elevated troponins ?- hsTn 298>>289>>228 ?- Patient had a heart cath on 05/16/21 that showed  no coronary artery disease  ?- Patient denies chest pain or equivalent symptoms ?- Troponin with flat trend, patient without any anginal symptoms, recent cath with clean coronaries-- very low suspicion for ACS. Troponin elevation occurred in the setting of AKI, dehydration and hypotension on admission. Patient was also positive for cocaine (denied use) ?-continue Telemetry ?-EKG PRN ?  ?Syncope ?-in the setting of dehydration (occurred after a few days of vomiting, syncope occurred after going from squatting to  standing). CT head negative for acute intracranial process ?-check orthostatic vital signs-- ordered, but will likely be inaccurate as patient received IV fluids overnight  ?-encourage hydration  ?-Most recent echocardiogram from 07/06/21 showed EF 40-45%, normal diastolic parameters. No need to repeat   ?  ?HFrEF ?- Echo (3/23): EF 25-30% Small LV thrombus. Echo 07/06/21: EF 40-45% RV normal ?- appears dry and well perfused on exam today  ?- Continue carvedilol 3.125 mg bid. ?- Understand given his AKI, his other GDMT (losartan, spironolactone and jardiance) need to be held ?- Plan to resume heart meds when AKI and BP improve, and continue monitor renal function ?  ?AKI ?-manage per primary team ?  ?Hx of cocaine abuse/tobacco abuse ?-denies active cocaine use ?-UDS positive for cocaine and opiates  ?-Patient has been counseled on cessation this admission  ?  ?Reported prolonged QT ?-repeated ECG 07/06/21 demonstrated normal QTc ? ?  ?For questions or updates, please contact [facility] ?Please consult [email or web address] for contact info under  ? ?  ?Signed, ?Jonita Albee, PA-C  ?07/20/2021, 7:41 AM   ? ?History and all data above reviewed.  Patient examined.  I agree with the findings as above.    Currently denies any complaints and wants to go home.  He reports nausea and vomiting recently which might explain the dehydration.  No chest pain.  No palpitations.  The patient exam reveals COR:RRR  ,  Lungs:  Clear  ,  Abd: Positive bowel sounds, no rebound no guarding, Ext No edema  .  All available labs, radiology testing, previous records reviewed. Agree with documented assessment and plan.  Syncope:  Probably secondary to

## 2021-07-20 NOTE — Hospital Course (Addendum)
David [name] is a 41 y.o. male with medical history significant for HFrEF (EF 40-45% by TTE 07/06/2021) NICM, LV thrombus s/p Eliquis treatment now off anticoagulation, T2DM, HTN, cocaine, tobacco, alcohol use who is admitted for evaluation of syncope and AKI. ? ?Reported couple days of nausea and vomiting, on the day of hospitalization he experienced a syncope episode after standing up from a squatting position and started walking. Positive prodrome with lightheadedness and recovered his consciousness after a short period of time. On 07/06/21 the dose of losartan was increased from 25 to 50 mg due to uncontrolled hypertension.  ?Positive daily alcohol use. On his initial physical examination hid blood pressure was 93/65, HR 88, RR 19 and 02 saturation 96%, dry mucous membranes, lungs clear to auscultation, heart with S1 and S2 present and rhythmic with no gallops, rubs or murmurs, no lower extremity edema.  ? ?Na 134, K 4,0 Cl 103 bicarbonate 22, glucose 85, bun 45 cr 2.56  ?High sensitive troponin 298 and 228  ?Wbc 10,3 hgb 16 plt 347  ?Sars covid 19 negative  ? ?Chest radiograph with no cardiomegaly and with no infiltrates.  ? ?EKG 124 bpm, right axis deviation, normal intervals, sinus rhythm with ST depression in II, III, AvF, V4 to V6 with T wave depression in II, AVF  ? ?Patient was placed on isotonic IV fluids and antihypertensive medications have been held.  ?

## 2021-07-20 NOTE — Assessment & Plan Note (Addendum)
Reports ongoing tobacco use down from 1 pack a day to half a pack.  He declines nicotine patch.  Also report daily alcohol use, last drink 2 days prior to admission.  No withdrawal symptoms at this time.   ? UDS also positive for cocaine. ?

## 2021-07-20 NOTE — Assessment & Plan Note (Addendum)
No clinical signs of decompensated heart failure ?Continue close blood pressure monitoring ?Hold on IV fluids and continue to hold on antihypertensive medications and diuretics.  ? ?Elevation in troponin, not consistent with acute coronary syndrome. Possible tachycardia mediated.  ?

## 2021-07-20 NOTE — Assessment & Plan Note (Addendum)
Orthostatic syncope. ? ?Plan to continue holding antihypertensive medications and diuretics ?Hold on IV fluids  ?Continue close blood pressure monitoring.  ?

## 2021-07-20 NOTE — Assessment & Plan Note (Addendum)
Pre renal AKI, hypokalemia ? ?Renal function not back to baseline with a serum cr at 1,55, K is 3,2 ?Plan to hold on IV fluids and add 80 meq Kcl po in 2 divided doses.  ?Follow up renal function in am. ?Avoid hypotension and nephrotoxic medications.  ? ? ? ?

## 2021-07-20 NOTE — Assessment & Plan Note (Signed)
Holding [place].  Start on SSI. ?

## 2021-07-20 NOTE — Progress Notes (Signed)
?Progress Note ? ? ?Patient: David [name] [id number] DOB: February 21, 1981 DOA: 07/19/2021     0 ?DOS: the patient was seen and examined on 07/20/2021 ?  ?Brief hospital course: ?David [name] is a 41 y.o. male with medical history significant for HFrEF (EF 40-45% by TTE 07/06/2021) NICM, LV thrombus s/p Eliquis treatment now off anticoagulation, T2DM, HTN, cocaine, tobacco, alcohol use who is admitted for evaluation of syncope and AKI. ? ?Reported couple days of nausea and vomiting, on the day of hospitalization he experienced a syncope episode after standing up from a squatting position and started walking. Positive prodrome with lightheadedness and recovered his consciousness after a short period of time. On 07/06/21 the dose of losartan was increased from 25 to 50 mg due to uncontrolled hypertension.  ?Positive daily alcohol use. On his initial physical examination hid blood pressure was 93/65, HR 88, RR 19 and 02 saturation 96%, dry mucous membranes, lungs clear to auscultation, heart with S1 and S2 present and rhythmic with no gallops, rubs or murmurs, no lower extremity edema.  ? ?Na 134, K 4,0 Cl 103 bicarbonate 22, glucose 85, bun 45 cr 2.56  ?High sensitive troponin 298 and 228  ?Wbc 10,3 hgb 16 plt 347  ?Sars covid 19 negative  ? ?Chest radiograph with no cardiomegaly and with no infiltrates.  ? ?EKG 124 bpm, right axis deviation, normal intervals, sinus rhythm with ST depression in II, III, AvF, V4 to V6 with T wave depression in II, AVF  ? ?Patient was placed on isotonic IV fluids and antihypertensive medications have been held.  ? ?Assessment and Plan: ?* Syncope ?Orthostatic syncope. ? ?Plan to continue holding antihypertensive medications and diuretics ?Hold on IV fluids  ?Continue close blood pressure monitoring.  ? ?AKI (acute kidney injury) ([place]) ?Pre renal AKI, hypokalemia ? ?Renal function not back to baseline with a serum cr at 1,55, K is 3,2 ?Plan to hold on IV fluids and add 80 meq Kcl po in  2 divided doses.  ?Follow up renal function in am. ?Avoid hypotension and nephrotoxic medications.  ? ? ? ? ?HFrEF (heart failure with reduced ejection fraction) ([place]) ?No clinical signs of decompensated heart failure ?Continue close blood pressure monitoring ?Hold on IV fluids and continue to hold on antihypertensive medications and diuretics.  ? ?Elevation in troponin, not consistent with acute coronary syndrome. Possible tachycardia mediated.  ? ?Hypertension associated with diabetes ([place]) ?Plan to continue with carvedilol, but hold on losartan and spironolactone.  ? ?Type 2 diabetes mellitus without complication, with long-term current use of insulin ([place]) ?Holding [place].  Start on SSI. ? ?Polysubstance abuse ([place]) ?Reports ongoing tobacco use down from 1 pack a day to half a pack.  He declines nicotine patch.  Also report daily alcohol use, last drink 2 days prior to admission.  No withdrawal symptoms at this time.   ? UDS also positive for cocaine. ? ? ? ? ?  ? ?Subjective: Patient is feeling better, but not yet back to his baseline, no chest pain, or dyspnea  ? ?Physical Exam: ?Vitals:  ? 07/20/21 0700 07/20/21 1000 07/20/21 1030 07/20/21 1200  ?BP: 111/70 107/68  105/73  ?Pulse: 73 80 78 66  ?Resp: 20 (!) 8 14 17   ?Temp:      ?TempSrc:      ?SpO2: 100% 100% 97% 100%  ?Weight:      ?Height:      ? ?Neurology awake and alert ?ENT with no pallor or icterus ?Cardiovascular with  S1 and S2 present and rhythmic with no gallops, rubs or murmurs ?Respiratory with no rales or wheezing ?Abdomen not distended  ?Data Reviewed: ? ? ? ?Family Communication: no family at the bedside  ? ?Disposition: ?Status is: Observation ?The patient remains OBS appropriate and will d/c before 2 midnights. ? Planned Discharge Destination: Home ? ? ? ?Author: ?Tawni Millers, MD ?07/20/2021 12:11 PM ? ?For on call review [email or web address].  ?

## 2021-07-20 NOTE — ED Notes (Signed)
MD paged regarding pt leaving AMA.

## 2021-07-20 NOTE — Assessment & Plan Note (Signed)
Initial troponin 298, down trended to 228 on repeat.  Likely type II demand ischemia in setting of AKI, dehydration, hypotension and syncopal event.  Denies any chest pain. ?-Continue to monitor on telemetry ?-Cardiology following ?

## 2021-07-20 NOTE — Assessment & Plan Note (Addendum)
Plan to continue with carvedilol, but hold on losartan and spironolactone.  ?

## 2021-07-20 NOTE — ED Notes (Signed)
Breakfast order placed ?

## 2021-07-20 NOTE — Discharge Summary (Signed)
?Physician Discharge Summary ?  ?Patient: David David [name] MRN: [id number] DOB: Sep 19, 1980  ?Admit date:     07/19/2021  ?Discharge date: 07/20/21  ?Discharge Physician: David David [name]  ? ?PCP: David Sessions, NP  ? ?Recommendations at discharge:  ? ? Patient left the hospital against medical advice. ? ?Discharge Diagnoses: ?Principal Problem: ?  Syncope ?Active Problems: ?  David [name] (acute kidney injury) (HCC) ?  HFrEF (heart failure with reduced ejection fraction) (HCC) ?  Hypertension associated with diabetes (HCC) ?  Type 2 diabetes mellitus without complication, with long-term current use of insulin (HCC) ?  Polysubstance abuse (HCC) ? ?Resolved Problems: ?  * No resolved hospital problems. * ? ?Hospital Course: ?David David [name] is a 41 y.o. male with medical history significant for HFrEF (EF 40-45% by TTE 07/06/2021) David [name], David David [name], David David [name], David David [name], David David [name], David David [name], David David [name]. ? ?Reported couple days of nausea and vomiting, on the day of hospitalization he experienced a syncope episode after standing up from a squatting position and started walking. Positive prodrome with lightheadedness and recovered his consciousness after a short period of time. On 07/06/21 the dose of losartan was increased from 25 to 50 mg due to uncontrolled hypertension.  ?Positive daily David use. On his initial physical examination hid blood pressure was 93/65, HR 88, RR 19 and 02 saturation 96%, dry mucous membranes, lungs clear to auscultation, heart with S1 and S2 present and rhythmic with no gallops, rubs or murmurs, no lower extremity edema.  ? ?Na 134, K 4,0 Cl 103 bicarbonate 22, glucose 85, bun 45 cr 2.56  ?High sensitive troponin 298 and 228  ?Wbc 10,3 hgb 16 plt 347  ?Sars covid 19 negative  ? ?Chest radiograph with no cardiomegaly and with no infiltrates.  ? ?EKG 124 bpm, right axis deviation, normal intervals, sinus  rhythm with ST depression in II, III, AvF, V4 to V6 with T wave depression in II, AVF  ? ?Patient was placed on isotonic IV fluids and antihypertensive medications have been held.  ? ?Assessment and Plan: ?* Syncope ?Orthostatic syncope. ? ?Plan to continue holding antihypertensive medications and diuretics ?Hold on IV fluids  ?Continue close blood pressure monitoring.  ? ?David [name] (acute kidney injury) (HCC) ?Pre renal David [name], hypokalemia ? ?Renal function not back to baseline with a serum cr at 1,55, K is 3,2 ?Plan to hold on IV fluids and add 80 meq Kcl po in 2 divided doses.  ?Follow up renal function in am. ?Avoid hypotension and nephrotoxic medications.  ? ? ? ? ?HFrEF (heart failure with reduced ejection fraction) (HCC) ?No clinical signs of decompensated heart failure ?Continue close blood pressure monitoring ?Hold on IV fluids and continue to hold on antihypertensive medications and diuretics.  ? ?Elevation in troponin, not consistent with acute coronary syndrome. Possible tachycardia mediated.  ? ?Hypertension associated with diabetes (HCC) ?Plan to continue with carvedilol, but hold on losartan and spironolactone.  ? ?Type 2 diabetes mellitus without complication, with long-term current use of insulin (HCC) ?Holding Latimer.  Start on SSI. ? ?Polysubstance abuse (HCC) ?Reports ongoing David David [name] use down from 1 pack a day to half a pack.  He declines nicotine patch.  Also report daily David use, last drink 2 days prior to admission.  No withdrawal symptoms at this time.   ? UDS also positive for David David [name]. ? ? ? ? ?  ? ? ? ? ?Discharge Exam: ?[name]  Weights  ? 07/19/21 1546  ?Weight: 67.1 kg  ? ? ? ?The results of significant diagnostics from this hospitalization (including imaging, microbiology, ancillary and laboratory) are listed below for reference.  ? ?Imaging Studies: ?CT Head Wo Contrast ? ?Result Date: 07/19/2021 ?CLINICAL DATA:  Syncope, fell, right-sided facial abrasion EXAM: CT HEAD WITHOUT CONTRAST  TECHNIQUE: Contiguous axial images were obtained from the base of the skull through the vertex without intravenous contrast. RADIATION DOSE REDUCTION: This exam was performed according to the departmental dose-optimization program which includes automated exposure control, adjustment of the mA and/or kV according to patient size and/or use of iterative reconstruction technique. COMPARISON:  05/13/2021 FINDINGS: Brain: No acute infarct or hemorrhage. Lateral ventricles and midline structures are unremarkable. No acute extra-axial fluid collections. No mass effect. Vascular: No hyperdense vessel or unexpected calcification. Skull: Normal. Negative for fracture or focal lesion. Sinuses/Orbits: Polypoid mucosal thickening within the maxillary sinuses. Moderate mucoperiosteal thickening and retained secretions within the sphenoid sinus. Other: None. IMPRESSION: 1. No acute intracranial process. Electronically Signed   By: Sharlet Salina M.D.   On: 07/19/2021 20:38  ? ?DG Chest Portable 1 View ? ?Result Date: 07/19/2021 ?CLINICAL DATA:  Syncope. EXAM: PORTABLE CHEST 1 VIEW COMPARISON:  Chest x-ray 06/21/2021 FINDINGS: The heart size and mediastinal contours are within normal limits. Both lungs are clear. The visualized skeletal structures are unremarkable. IMPRESSION: No active disease. Electronically Signed   By: Darliss Cheney M.D.   On: 07/19/2021 16:37  ? ?DG Chest Portable 1 View ? ?Result Date: 06/21/2021 ?CLINICAL DATA:  Shortness of breath. EXAM: PORTABLE CHEST 1 VIEW COMPARISON:  None. FINDINGS: No consolidation. No visible pleural effusions or pneumothorax. Cardiomediastinal silhouette is within normal limits. No displaced fracture. IMPRESSION: No evidence of acute cardiopulmonary disease. Electronically Signed   By: Feliberto Harts M.D.   On: 06/21/2021 15:29  ? ?ECHOCARDIOGRAM COMPLETE ? ?Result Date: 07/06/2021 ?   ECHOCARDIOGRAM REPORT   Patient Name:   David David [name] Date of Exam: 07/06/2021 Medical Rec #:   [id number]        Height:       66.0 in Accession #:    [id number]       Weight:       147.8 lb Date of Birth:  1981/03/04        BSA:          1.759 m? Patient Age:    40 years         BP:           130/85 mmHg Patient Gender: M                HR:           63 bpm. Exam Location:  Outpatient Procedure: 2D Echo, Color Doppler and Cardiac Doppler Indications:    I50.21 Acute systolic (congestive) heart failure  History:        Patient has prior history of Echocardiogram examinations, most                 recent 05/14/2021. Risk Factors:Diabetes and Dyslipidemia.  Sonographer:    Irving Burton Senior RDCS Referring Phys: [phone] JESSICA M MILFORD IMPRESSIONS  1. Left ventricular ejection fraction, by estimation, is 40 to 45%. The left ventricle has mildly decreased function. The left ventricle has no regional wall motion abnormalities. Left ventricular diastolic parameters were normal.  2. Right ventricular systolic function is normal. The right ventricular size is normal. Tricuspid regurgitation signal is inadequate for  assessing PA pressure.  3. The mitral valve is normal in structure. Mild mitral valve regurgitation. No evidence of mitral stenosis.  4. The aortic valve is normal in structure. Aortic valve regurgitation is not visualized. No aortic stenosis is present.  5. The inferior vena cava is normal in size with greater than 50% respiratory variability, suggesting right atrial pressure of 3 mmHg. Comparison(s): A prior study was performed on 05/14/2021. Changes from prior study are noted. The left ventricular function has improved. FINDINGS  Left Ventricle: Left ventricular ejection fraction, by estimation, is 40 to 45%. The left ventricle has mildly decreased function. The left ventricle has no regional wall motion abnormalities. The left ventricular internal cavity size was normal in size. There is no left ventricular hypertrophy. Left ventricular diastolic parameters were normal. Right Ventricle: The right ventricular  size is normal. No increase in right ventricular wall thickness. Right ventricular systolic function is normal. Tricuspid regurgitation signal is inadequate for assessing PA pressure. Left Atrium: Left atrial size

## 2021-07-20 NOTE — ED Notes (Signed)
Pt came out of room and states he is leaving AMA. IVs were removed by pt. AMA form signed.  ?

## 2021-07-26 ENCOUNTER — Other Ambulatory Visit: Payer: Self-pay

## 2021-07-26 ENCOUNTER — Emergency Department (HOSPITAL_COMMUNITY)
Admission: EM | Admit: 2021-07-26 | Discharge: 2021-07-26 | Disposition: A | Discharge status: Home / Self Care | Payer: Medicaid Other | Attending: Emergency Medicine | Admitting: Emergency Medicine

## 2021-07-26 DIAGNOSIS — Z79899 Other long term (current) drug therapy: Secondary | ICD-10-CM | POA: Diagnosis not present

## 2021-07-26 DIAGNOSIS — R112 Nausea with vomiting, unspecified: Secondary | ICD-10-CM

## 2021-07-26 DIAGNOSIS — Z794 Long term (current) use of insulin: Secondary | ICD-10-CM | POA: Diagnosis not present

## 2021-07-26 DIAGNOSIS — I10 Essential (primary) hypertension: Secondary | ICD-10-CM | POA: Diagnosis not present

## 2021-07-26 DIAGNOSIS — E1165 Type 2 diabetes mellitus with hyperglycemia: Secondary | ICD-10-CM | POA: Diagnosis not present

## 2021-07-26 LAB — COMPREHENSIVE METABOLIC PANEL
ALT: 13 U/L (ref 0–44)
AST: 23 U/L (ref 15–41)
Albumin: 3.8 g/dL (ref 3.5–5.0)
Alkaline Phosphatase: 38 U/L (ref 38–126)
Anion gap: 9 (ref 5–15)
BUN: 12 mg/dL (ref 6–20)
CO2: 26 mmol/L (ref 22–32)
Calcium: 9.3 mg/dL (ref 8.9–10.3)
Chloride: 104 mmol/L (ref 98–111)
Creatinine, Ser: 0.94 mg/dL (ref 0.61–1.24)
GFR, Estimated: >60 mL/min (ref >60)
Glucose, Bld: 152 mg/dL — ABNORMAL HIGH (ref 70–99)
Potassium: 3.8 mmol/L (ref 3.5–5.1)
Sodium: 139 mmol/L (ref 135–145)
Total Bilirubin: 0.5 mg/dL (ref 0.3–1.2)
Total Protein: 6.8 g/dL (ref 6.5–8.1)

## 2021-07-26 LAB — CBC
HCT: 42.6 % (ref 39.0–52.0)
Hemoglobin: 13.7 g/dL (ref 13.0–17.0)
MCH: 27 pg (ref 26.0–34.0)
MCHC: 32.2 g/dL (ref 30.0–36.0)
MCV: 83.9 fL (ref 80.0–100.0)
Platelets: 340 10*3/uL (ref 150–400)
RBC: 5.08 MIL/uL (ref 4.22–5.81)
RDW: 14.9 % (ref 11.5–15.5)
WBC: 11.2 10*3/uL — ABNORMAL HIGH (ref 4.0–10.5)
nRBC: 0 % (ref 0.0–0.2)

## 2021-07-26 LAB — CBG MONITORING, ED: Glucose-Capillary: 158 mg/dL — ABNORMAL HIGH (ref 70–99)

## 2021-07-26 LAB — LIPASE, BLOOD: Lipase: 28 U/L (ref 11–51)

## 2021-07-26 IMAGING — CT CT ABD-PELV W/O CM
2 of 4 series · 17 of 46 positions shown, 19 images · non-contrast
Comparison: None.

CLINICAL DATA: Abdominal pain, vomiting

EXAM:
CT ABDOMEN AND PELVIS WITHOUT CONTRAST
TECHNIQUE: Multidetector CT imaging of the abdomen and pelvis was performed
following the standard protocol without IV contrast.

[Series 3: a/p w/o 5mm · axial · non-contrast · 0.86mm/px · z∈[-434,+16]mm · 14 of 98 slices shown, 16 images]
[im 4/98  soft-tissue]
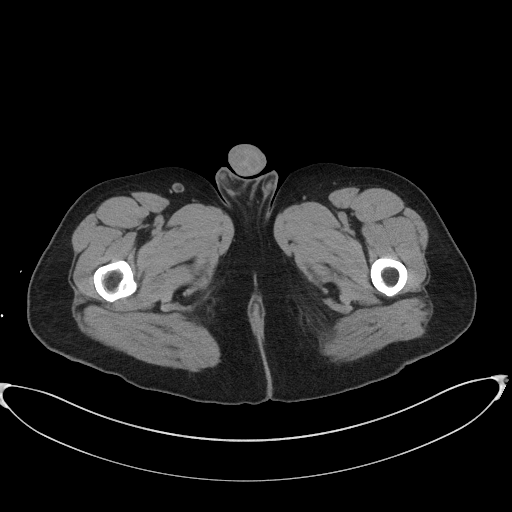
[im 4/98  bone]
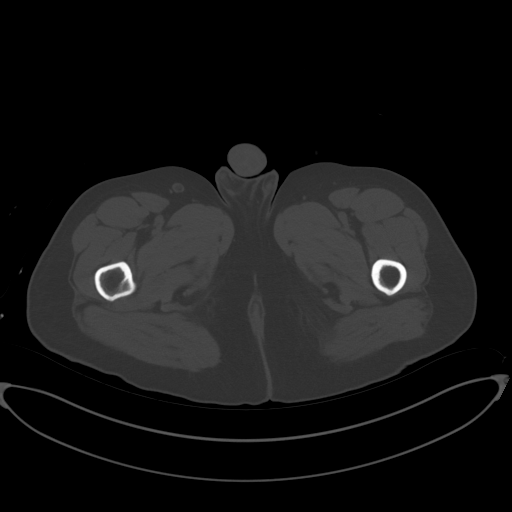
[im 12/98  soft-tissue]
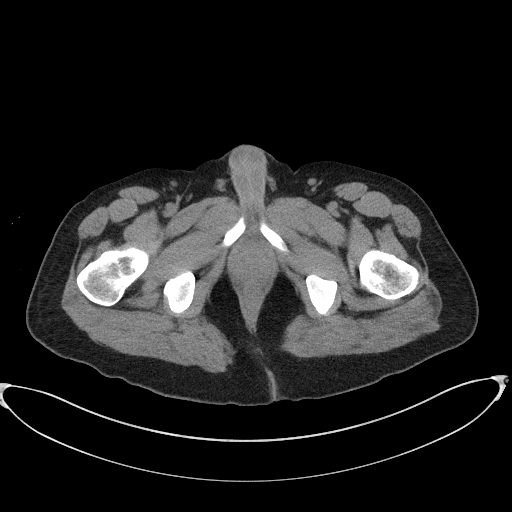
[im 19/98  soft-tissue]
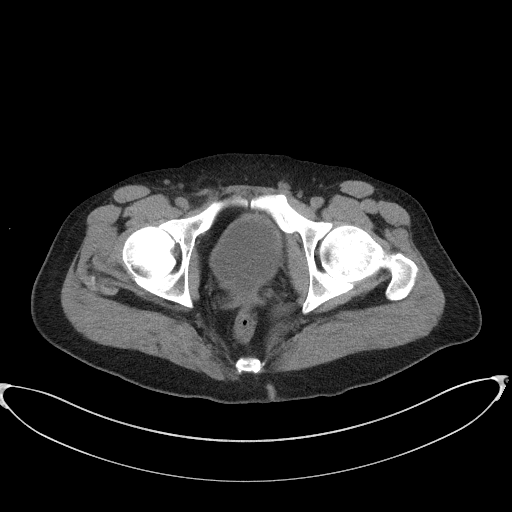
[im 27/98  soft-tissue]
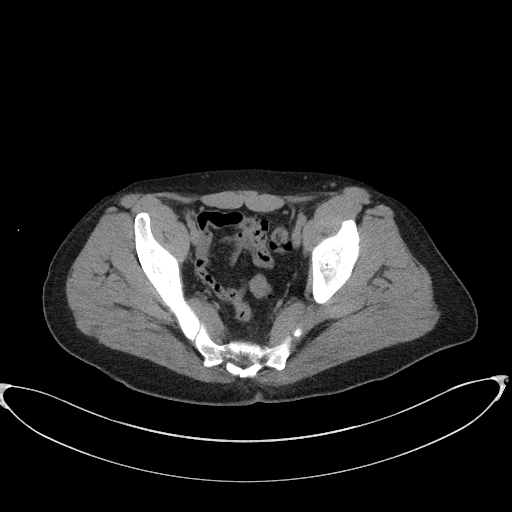
[im 34/98  soft-tissue]
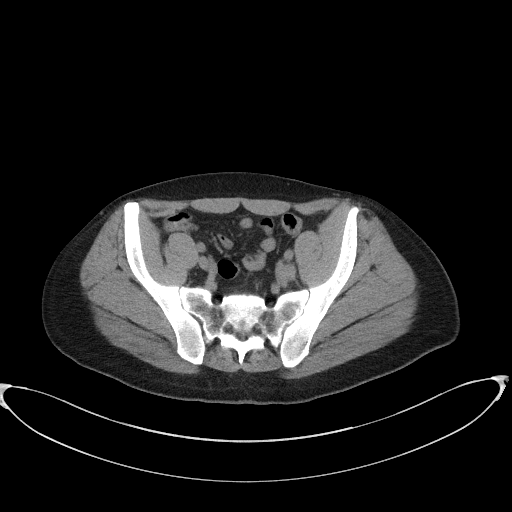
[im 38/98  soft-tissue]
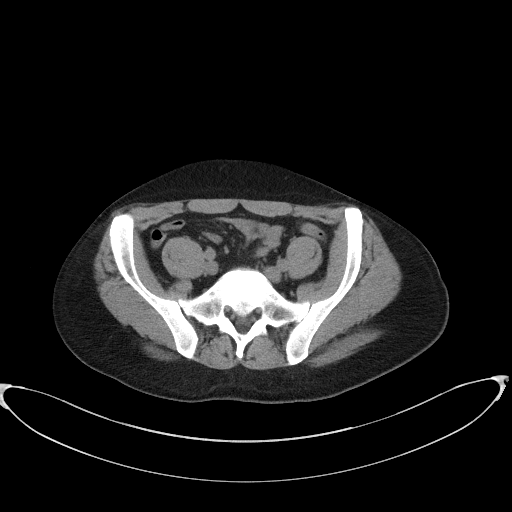
[im 45/98  soft-tissue]
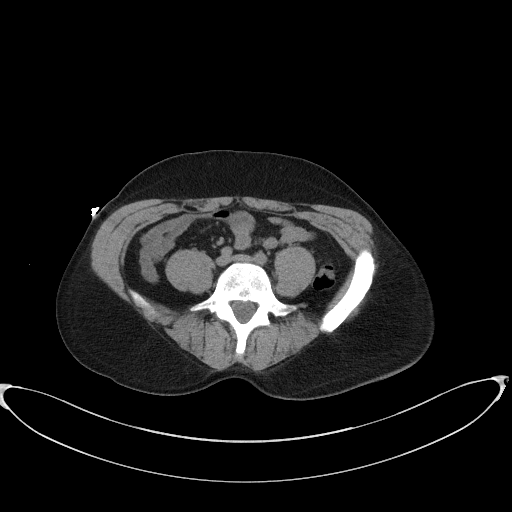
[im 53/98  soft-tissue]
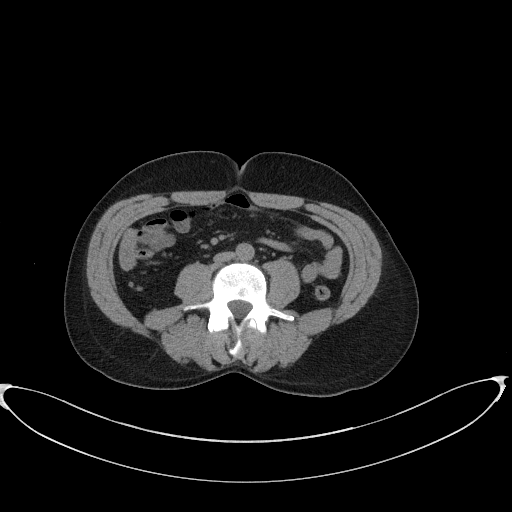
[im 60/98  soft-tissue]
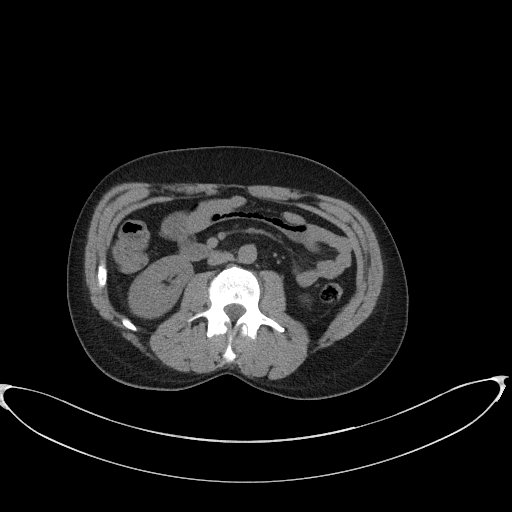
[im 60/98  bone]
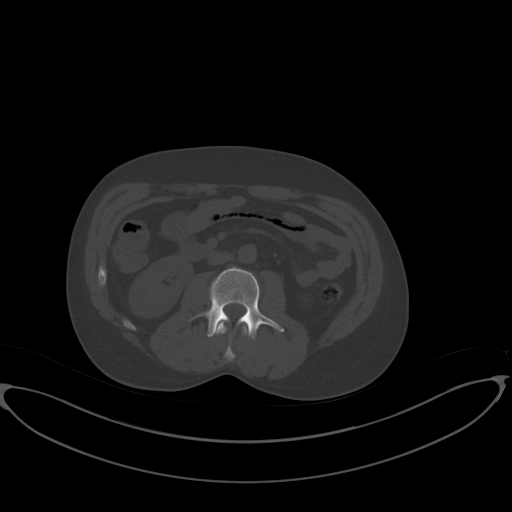
[im 64/98  soft-tissue]
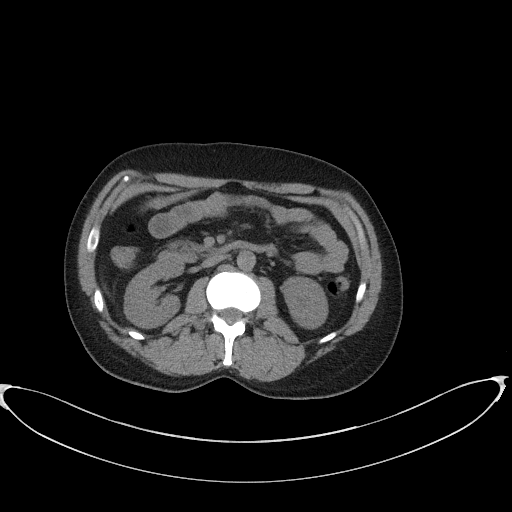
[im 71/98  soft-tissue]
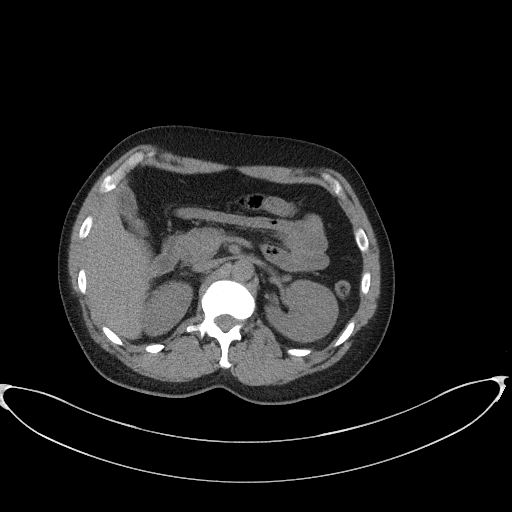
[im 79/98  soft-tissue]
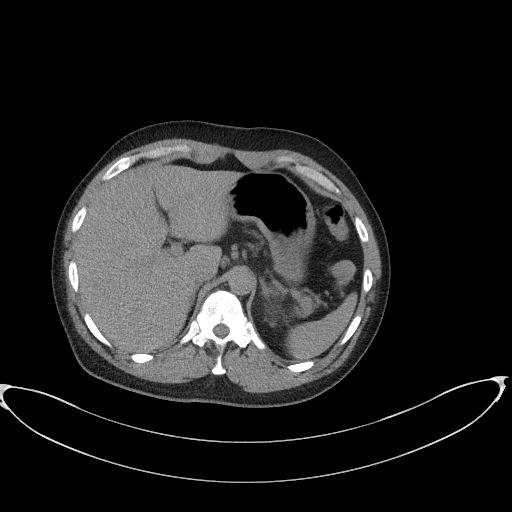
[im 86/98  soft-tissue]
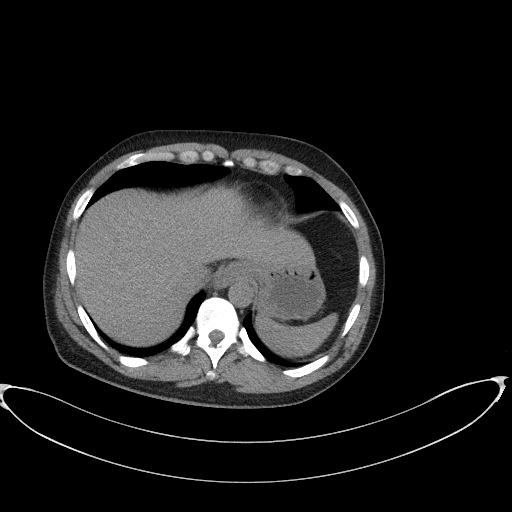
[im 94/98  soft-tissue]
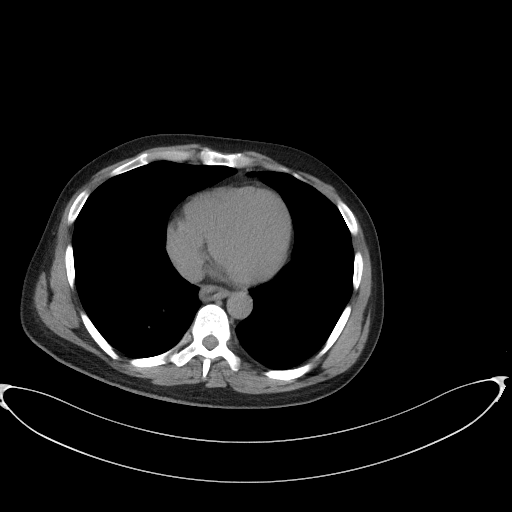

[Series 6: a/p w/o cor · coronal · non-contrast · 0.76mm/px · 3 of 141 slices shown]
[im 47/141  soft-tissue]
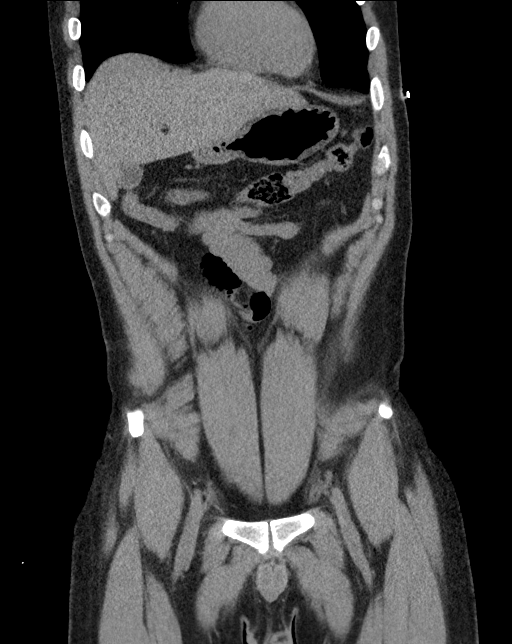
[im 63/141  soft-tissue]
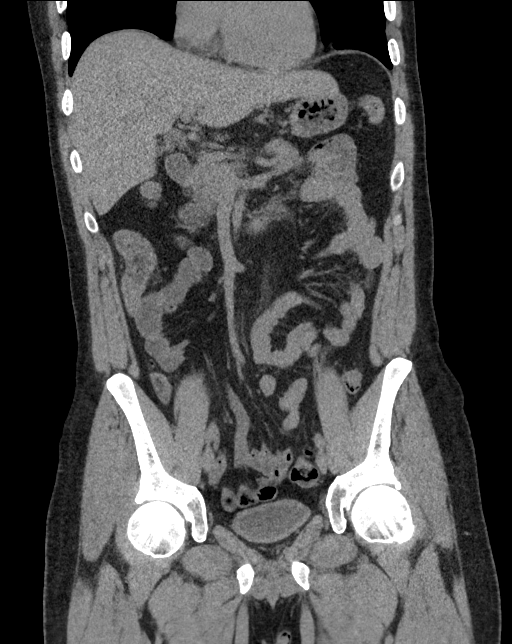
[im 78/141  soft-tissue]
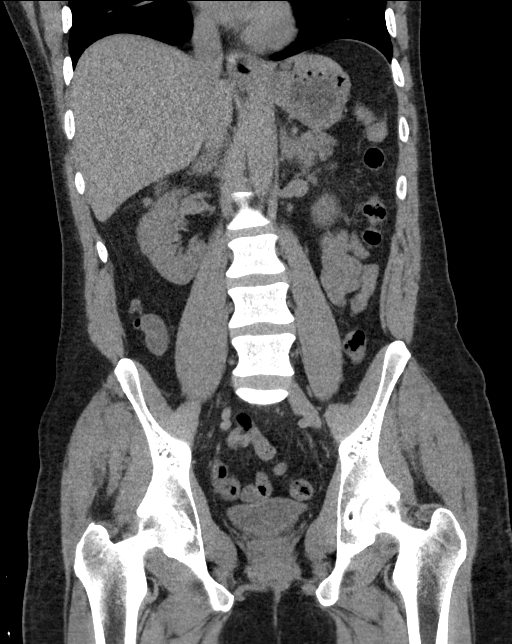

[17 of 46 positions shown; findings below may reference images not displayed]

FINDINGS: Lower chest: Lung bases are clear. No effusions. Heart is normal
size.

Hepatobiliary: No focal hepatic abnormality. Gallbladder
unremarkable.

Pancreas: No focal abnormality or ductal dilatation.

Spleen: No focal abnormality.  Normal size.

Adrenals/Urinary Tract: No adrenal abnormality. No focal renal
abnormality. No stones or hydronephrosis. Urinary bladder is
unremarkable.

Stomach/Bowel: Normal appendix. Stomach, large and small bowel
grossly unremarkable.

Vascular/Lymphatic: No evidence of aneurysm or adenopathy.

Reproductive: No visible focal abnormality.

Other: No free fluid or free air.

Musculoskeletal: No acute bony abnormality.
IMPRESSION: No acute findings in the abdomen or pelvis.

## 2021-07-26 MED ORDER — LACTATED RINGERS IV BOLUS
1500.0000 mL | Freq: Once | INTRAVENOUS | Status: AC
  Administered 2021-07-26: 1500 mL via INTRAVENOUS

## 2021-07-26 MED ORDER — ONDANSETRON 4 MG PO TBDP
4.0000 mg | ORAL_TABLET | Freq: Once | ORAL | Status: AC
  Administered 2021-07-26: 4 mg via ORAL
  Filled 2021-07-26: qty 1

## 2021-07-26 MED ORDER — METOCLOPRAMIDE HCL 5 MG/ML IJ SOLN
10.0000 mg | Freq: Once | INTRAMUSCULAR | Status: AC
  Administered 2021-07-26: 10 mg via INTRAVENOUS
  Filled 2021-07-26: qty 2

## 2021-07-26 MED ORDER — CARVEDILOL 3.125 MG PO TABS
3.1250 mg | ORAL_TABLET | Freq: Once | ORAL | Status: AC
  Administered 2021-07-26: 3.125 mg via ORAL
  Filled 2021-07-26: qty 1

## 2021-07-26 MED ORDER — LOSARTAN POTASSIUM 50 MG PO TABS
50.0000 mg | ORAL_TABLET | Freq: Once | ORAL | Status: AC
  Administered 2021-07-26: 50 mg via ORAL
  Filled 2021-07-26: qty 1

## 2021-07-26 MED ORDER — ONDANSETRON 4 MG PO TBDP: 4.0000 mg | ORAL_TABLET | Freq: Three times a day (TID) | ORAL | 0 refills | Status: DC | PRN

## 2021-07-26 MED ORDER — CARVEDILOL 3.125 MG PO TABS: 3.1250 mg | ORAL_TABLET | Freq: Two times a day (BID) | ORAL | Status: DC

## 2021-07-26 MED ORDER — LABETALOL HCL 5 MG/ML IV SOLN
10.0000 mg | Freq: Once | INTRAVENOUS | Status: AC
  Administered 2021-07-26: 10 mg via INTRAVENOUS
  Filled 2021-07-26: qty 4

## 2021-07-26 MED ORDER — SPIRONOLACTONE 25 MG PO TABS
25.0000 mg | ORAL_TABLET | Freq: Every day | ORAL | Status: DC
  Administered 2021-07-26: 25 mg via ORAL
  Filled 2021-07-26: qty 1

## 2021-07-26 NOTE — ED Provider Notes (Signed)
[facility] EMERGENCY DEPARTMENT Provider Note   CSN: [id number] Arrival date & time: 07/26/21  1125     History  Chief Complaint  Patient presents with   Emesis   Nausea    David [name] is a 41 y.o. male.  HPI Patient is a 41 year old male with a history of hypertension, diabetes mellitus, gastritis, upper GI bleeding, polysubstance abuse, alcohol withdrawal, who presents to the emergency department due to nausea/vomiting.  States this began this morning upon waking.  Reports more than 10 episodes of emesis.  Denies any chest pain, shortness of breath, abdominal pain, diarrhea..  States that he drank about 1 pint of liquor last night.  States that he does not drink on a daily basis.    Home Medications Prior to Admission medications   Medication Sig Start Date End Date Taking? Authorizing Provider  acetaminophen (TYLENOL) 500 MG tablet Take 500 mg by mouth every 6 (six) hours as needed for moderate pain.    [provider]  blood glucose meter kit and supplies KIT Dispense based on patient and insurance preference. Use up to four times daily as directed. 05/17/21   Kayleen Memos, DO  carvedilol (COREG) 3.125 MG tablet Take 1 tablet (3.125 mg total) by mouth 2 (two) times daily with a meal. 05/24/21   Clegg, Amy D, NP  empagliflozin (JARDIANCE) 10 MG TABS tablet Take 1 tablet (10 mg total) by mouth daily before breakfast. 06/16/21   Milford, Maricela Bo, FNP  folic acid (FOLVITE) 1 MG tablet Take 1 tablet (1 mg total) by mouth daily. 05/17/21 08/15/21  Kayleen Memos, DO  insulin aspart (NOVOLOG) 100 UNIT/ML injection For blood sugars 0-150 give 0 units of insulin, 200-250 give 4 units of insulin, 251-300 give 6 units, 301-350 give 8 units, 351- 400 give 10 units, > 400 give 12 units and call M.D. Discussed hypoglycemia protocol. Patient not taking: Reported on 07/20/2021 01/13/21   Kerin Perna, NP  Insulin Pen Needle (PEN NEEDLES 31GX5/16") 31G X 8 MM  MISC As directed 08/16/20   Kathie Dike, MD  losartan (COZAAR) 50 MG tablet Take 1 tablet (50 mg total) by mouth daily. 07/06/21   Bensimhon, Shaune Pascal, MD  ondansetron (ZOFRAN) 4 MG tablet Take 1 tablet (4 mg total) by mouth every 8 (eight) hours as needed for nausea or vomiting. 06/21/21   Tegeler, Gwenyth Allegra, MD  ondansetron (ZOFRAN-ODT) 4 MG disintegrating tablet Take 1 tablet (4 mg total) by mouth every 8 (eight) hours as needed for nausea or vomiting. 07/26/21  Yes Rayna Sexton, PA-C  pantoprazole (PROTONIX) 40 MG tablet Take 1 tablet (40 mg total) by mouth daily. 05/17/21 07/07/22  Kayleen Memos, DO  polyethylene glycol (MIRALAX / GLYCOLAX) 17 g packet Take 17 g by mouth daily as needed for mild constipation. 05/17/21   Kayleen Memos, DO  spironolactone (ALDACTONE) 25 MG tablet Take 0.5 tablets (12.5 mg total) by mouth daily. 06/16/21   Milford, Maricela Bo, FNP  thiamine 100 MG tablet Take 1 tablet (100 mg total) by mouth daily. 05/17/21 08/15/21  Kayleen Memos, DO      Allergies    Patient has no known allergies.    Review of Systems   Review of Systems  All other systems reviewed and are negative. Ten systems reviewed and are negative for acute change, except as noted in the HPI.   Physical Exam Updated Vital Signs BP (!) 197/134   Pulse (!) 104  Temp 98.1 F (36.7 C) (Oral)   Resp 18   SpO2 100%  Physical Exam Vitals and nursing note reviewed.  Constitutional:      General: He is not in acute distress.    Appearance: Normal appearance. He is not ill-appearing, toxic-appearing or diaphoretic.  HENT:     Head: Normocephalic and atraumatic.     Right Ear: External ear normal.     Left Ear: External ear normal.     Nose: Nose normal.     Mouth/Throat:     Mouth: Mucous membranes are moist.     Pharynx: Oropharynx is clear. No oropharyngeal exudate or posterior oropharyngeal erythema.  Eyes:     General: No scleral icterus.       Right eye: No discharge.        Left  eye: No discharge.     Extraocular Movements: Extraocular movements intact.     Conjunctiva/sclera: Conjunctivae normal.  Cardiovascular:     Rate and Rhythm: Normal rate and regular rhythm.     Pulses: Normal pulses.     Heart sounds: Normal heart sounds. No murmur heard.   No friction rub. No gallop.  Pulmonary:     Effort: Pulmonary effort is normal. No respiratory distress.     Breath sounds: Normal breath sounds. No stridor. No wheezing, rhonchi or rales.  Abdominal:     General: Abdomen is flat.     Palpations: Abdomen is soft.     Tenderness: There is no abdominal tenderness.     Comments: Abdomen is flat, soft, and nontender.  Musculoskeletal:        General: Normal range of motion.     Cervical back: Normal range of motion and neck supple. No tenderness.  Skin:    General: Skin is warm and dry.  Neurological:     General: No focal deficit present.     Mental Status: He is alert and oriented to person, place, and time.  Psychiatric:        Mood and Affect: Mood normal.        Behavior: Behavior normal.    ED Results / Procedures / Treatments   Labs (all labs ordered are listed, but only abnormal results are displayed) Labs Reviewed  COMPREHENSIVE METABOLIC PANEL - Abnormal; Notable for the following components:      Result Value   Glucose, Bld 152 (*)    All other components within normal limits  CBC - Abnormal; Notable for the following components:   WBC 11.2 (*)    All other components within normal limits  CBG MONITORING, ED - Abnormal; Notable for the following components:   Glucose-Capillary 158 (*)    All other components within normal limits  LIPASE, BLOOD    EKG None  Radiology No results found.  Procedures Procedures   Medications Ordered in ED Medications  spironolactone (ALDACTONE) tablet 25 mg (25 mg Oral Given 07/26/21 2058)  ondansetron (ZOFRAN-ODT) disintegrating tablet 4 mg (4 mg Oral Given 07/26/21 1231)  lactated ringers bolus 1,500  mL (0 mLs Intravenous Stopped 07/26/21 2217)  metoCLOPramide (REGLAN) injection 10 mg (10 mg Intravenous Given 07/26/21 2018)  losartan (COZAAR) tablet 50 mg (50 mg Oral Given 07/26/21 2058)  carvedilol (COREG) tablet 3.125 mg (3.125 mg Oral Given 07/26/21 2210)  labetalol (NORMODYNE) injection 10 mg (10 mg Intravenous Given 07/26/21 2211)    ED Course/ Medical Decision Making/ A&P Clinical Course as of 07/26/21 2254  Tue Jul 26, 2021  2218 Patient reassessed.  Lying comfortably in bed.  Tolerating p.o. intake without difficulty.  Blood pressure still persistently elevated.  Patient states he has not been taking any of his home medications for many days.  Home medications have been ordered.  We will give additional IV labetalol. [LJ]    Clinical Course User Index [LJ] Rayna Sexton, PA-C                           Medical Decision Making Amount and/or Complexity of Data Reviewed Labs: ordered.  Risk Prescription drug management.  Pt is a 41 y.o. male who presents to the emergency department with persistent nausea and vomiting today after drinking alcohol last night.  Labs: CBC with a white count 11.2. CMP with a glucose of 152. CBG 158. Lipase of 28.  I, Rayna Sexton, PA-C, personally reviewed and evaluated these images and lab results as part of my medical decision-making.  On my exam patient's heart is regular rate and rhythm without murmurs, rubs, or gallops.  Lungs are clear to auscultation bilaterally.  Abdomen is soft and nontender in all 4 quadrants.  Patient's symptoms were treated with IV fluids as well as Zofran.  Now tolerating p.o. intake without difficulty.  Patient persistently hypertensive at this visit.  He notes that he has not taken his blood pressure medication for many days.  He states that "somebody told me my blood pressure was too low".  Patient was given his home losartan, carvedilol, as well as spironolactone.  He was also given additional IV labetalol.   His blood pressures improved mildly but still elevated.  Currently lying in bed and denies any somatic complaints at this time.  Given that patient has asymptomatic we will discharge him at this time.  He is agreeable with this plan.  Recommended that he follow-up with his cardiologist.  Eliott Nine him to continue taking his home medications as prescribed and to not miss any doses.  We will discharge him on a short course of Zofran for breakthrough nausea and vomiting.  Discussed return precautions.  His questions were answered and he was amicable at the time of discharge.  Note: Portions of this report may have been transcribed using voice recognition software. Every effort was made to ensure accuracy; however, inadvertent computerized transcription errors may be present.   Final Clinical Impression(s) / ED Diagnoses Final diagnoses:  Non-intractable vomiting with nausea  Hypertension, unspecified type   Rx / DC Orders ED Discharge Orders          Ordered    ondansetron (ZOFRAN-ODT) 4 MG disintegrating tablet  Every 8 hours PRN        07/26/21 2248              Rayna Sexton, PA-C 07/26/21 2254    Isla Pence, MD 07/28/21 1924

## 2021-07-26 NOTE — ED Triage Notes (Signed)
Pt. Stated, ive been sick since this morning with N/V.

## 2021-07-26 NOTE — Discharge Instructions (Addendum)
Like we discussed, please follow-up with your cardiologist regarding your blood pressure.  It was extremely elevated at this visit.  It is important you take all your regular medications daily like they are prescribed.  I have put your cardiologists contact information below.  Give them a call and schedule an appointment for reevaluation.  I am prescribing you a medication called Zofran.  This is a disintegrating tablet you can use up to 3 times a day for management of your nausea and vomiting.  Please only take this as prescribed.  Please only take this if you are experiencing nausea and vomiting that you cannot control.  If you develop any new or worsening symptoms please come back to the emergency department.

## 2021-07-26 NOTE — ED Provider Triage Note (Signed)
Emergency Medicine Provider Triage Evaluation Note  David [name] , a 41 y.o. male  was evaluated in triage.  Pt complains of emesis.  Symptoms began today.  Took some medicine for nausea this morning but vomited after.  Denies any abdominal pain or diarrhea.  Did not take his antihypertensive medicine this morning due to vomiting.  Denies any chest pain or headache.  Review of Systems  Positive: Nausea, emesis Negative: Chest pain, abdominal pain  Physical Exam  BP (!) 204/130 (BP Location: Left Arm)   Pulse 81   Temp 98.2 F (36.8 C) (Oral)   Resp 17   SpO2 98%  Gen:   Awake, no distress   Resp:  Normal effort  MSK:   Moves extremities without difficulty  Other:  Abdomen is soft and nontender  Medical Decision Making  Medically screening exam initiated at 12:27 PM.  Appropriate orders placed.  David [name] was informed that the remainder of the evaluation will be completed by another provider, this initial triage assessment does not replace that evaluation, and the importance of remaining in the ED until their evaluation is complete.  Labs and antiemetic ordered   Dietrich Pates, PA-C 07/26/21 1230

## 2021-07-26 NOTE — ED Notes (Signed)
Patient verbalizes understanding of discharge instructions. Opportunity for questioning and answers were provided. Armband removed by staff, pt discharged from ED ambulatory.   

## 2021-07-26 NOTE — ED Notes (Signed)
PA aware of pt BP - pt resting at this time - denies any CP or SOB

## 2021-09-22 ENCOUNTER — Telehealth (HOSPITAL_COMMUNITY): Payer: Self-pay | Admitting: Family Medicine

## 2021-10-06 ENCOUNTER — Encounter (HOSPITAL_COMMUNITY): Payer: Medicaid Other

## 2021-10-07 ENCOUNTER — Encounter (HOSPITAL_COMMUNITY): Payer: Medicaid Other

## 2021-10-17 ENCOUNTER — Encounter (HOSPITAL_COMMUNITY): Payer: Medicaid Other

## 2021-10-28 ENCOUNTER — Telehealth (HOSPITAL_COMMUNITY): Payer: Self-pay

## 2021-10-28 NOTE — Telephone Encounter (Signed)
Called and was unable to leave a voice message to confirm/remind patient of their appointment at [facility] on 10/31/21.

## 2021-10-31 ENCOUNTER — Other Ambulatory Visit (HOSPITAL_COMMUNITY): Payer: Self-pay

## 2021-10-31 ENCOUNTER — Encounter (HOSPITAL_COMMUNITY): Payer: Self-pay

## 2021-10-31 ENCOUNTER — Ambulatory Visit (HOSPITAL_COMMUNITY)
Admission: RE | Admit: 2021-10-31 | Discharge: 2021-10-31 | Disposition: A | Discharge status: Home / Self Care | Payer: Medicaid Other | Source: Ambulatory Visit | Attending: Cardiology | Admitting: Cardiology

## 2021-10-31 VITALS — BP 104/78 | HR 86 | Wt 137.4 lb

## 2021-10-31 DIAGNOSIS — R9431 Abnormal electrocardiogram [ECG] [EKG]: Secondary | ICD-10-CM | POA: Insufficient documentation

## 2021-10-31 DIAGNOSIS — E1140 Type 2 diabetes mellitus with diabetic neuropathy, unspecified: Secondary | ICD-10-CM | POA: Insufficient documentation

## 2021-10-31 DIAGNOSIS — I82890 Acute embolism and thrombosis of other specified veins: Secondary | ICD-10-CM | POA: Diagnosis not present

## 2021-10-31 DIAGNOSIS — Z7984 Long term (current) use of oral hypoglycemic drugs: Secondary | ICD-10-CM | POA: Insufficient documentation

## 2021-10-31 DIAGNOSIS — F17210 Nicotine dependence, cigarettes, uncomplicated: Secondary | ICD-10-CM | POA: Diagnosis not present

## 2021-10-31 DIAGNOSIS — I5042 Chronic combined systolic (congestive) and diastolic (congestive) heart failure: Secondary | ICD-10-CM | POA: Diagnosis not present

## 2021-10-31 DIAGNOSIS — Z8719 Personal history of other diseases of the digestive system: Secondary | ICD-10-CM | POA: Diagnosis not present

## 2021-10-31 DIAGNOSIS — F1410 Cocaine abuse, uncomplicated: Secondary | ICD-10-CM | POA: Insufficient documentation

## 2021-10-31 DIAGNOSIS — I110 Hypertensive heart disease with heart failure: Secondary | ICD-10-CM | POA: Diagnosis present

## 2021-10-31 DIAGNOSIS — Z91148 Patient's other noncompliance with medication regimen for other reason: Secondary | ICD-10-CM | POA: Diagnosis not present

## 2021-10-31 DIAGNOSIS — Z79899 Other long term (current) drug therapy: Secondary | ICD-10-CM | POA: Insufficient documentation

## 2021-10-31 DIAGNOSIS — I428 Other cardiomyopathies: Secondary | ICD-10-CM | POA: Diagnosis present

## 2021-10-31 DIAGNOSIS — Z556 Problems related to health literacy: Secondary | ICD-10-CM | POA: Diagnosis not present

## 2021-10-31 DIAGNOSIS — I5022 Chronic systolic (congestive) heart failure: Secondary | ICD-10-CM

## 2021-10-31 LAB — BASIC METABOLIC PANEL
Anion gap: 5 (ref 5–15)
BUN: 25 mg/dL — ABNORMAL HIGH (ref 6–20)
CO2: 31 mmol/L (ref 22–32)
Calcium: 9.2 mg/dL (ref 8.9–10.3)
Chloride: 103 mmol/L (ref 98–111)
Creatinine, Ser: 1.04 mg/dL (ref 0.61–1.24)
GFR, Estimated: >60 mL/min (ref >60)
Glucose, Bld: 96 mg/dL (ref 70–99)
Potassium: 3.9 mmol/L (ref 3.5–5.1)
Sodium: 139 mmol/L (ref 135–145)

## 2021-10-31 LAB — BRAIN NATRIURETIC PEPTIDE: B Natriuretic Peptide: 5.3 pg/mL (ref 0.0–100.0)

## 2021-10-31 MED ORDER — EMPAGLIFLOZIN 10 MG PO TABS
10.0000 mg | ORAL_TABLET | Freq: Every day | ORAL | 6 refills | Status: DC
  Filled 2021-10-31 – 2021-11-23 (×2): qty 30, 30d supply, fill #0

## 2021-10-31 MED ORDER — LOSARTAN POTASSIUM 25 MG PO TABS
12.5000 mg | ORAL_TABLET | Freq: Every day | ORAL | 6 refills | Status: DC
  Filled 2021-10-31 – 2021-11-23 (×2): qty 15, 30d supply, fill #0

## 2021-10-31 MED ORDER — SPIRONOLACTONE 25 MG PO TABS
12.5000 mg | ORAL_TABLET | Freq: Every day | ORAL | 6 refills | Status: DC
  Filled 2021-10-31 – 2021-11-23 (×2): qty 15, 30d supply, fill #0

## 2021-10-31 NOTE — Progress Notes (Signed)
ADVANCED HF CLINIC PROGRESS NOTE   Primary Care: Kerin Perna, NP Primary Cardiologist: Dr. Harrell Gave HF Cardiologist: Dr. Haroldine Laws  HPI: Mr Nez is a 41 y.o. with h/o  LV thrombus, GI bleed, gastritis, cocaine abuse, DMII, HTN, QQT prolongation, and systolic/diastolic HF.    Admitted 05/13/21 with N/V followed by syncope. On admit had AKI, hypokalemia, and hypocalcemia. UDS + cocaine and opitates. ECHO with EF 25-30%. Cath preserved cardiac output and normal cors. Started on GDMT. Started on DOAC for possible LV thrombus.    Seen in [facility] 3/23, volume up, NYHA III symptoms. Arlyce Harman and [place] added. Referred to the [facility].   Echo 07/06/21: EF 40-45% RV normal No LV thrombus   Admitted 5/23 with syncope and AKI, UDS + cocaine. Given IVF and GDMT held. Cards consulted due to elevated troponin. With no CP, elevated trop felt 2/2 to AKI and hypotension. Carvedilol resumed, discharged home, weight 148 lbs. Seen for post hospital f/u 5/23 and was doing well.   He presents today for 3 month f/u. Reports feeling bad. Wt down 11 lb,  at 137 lb. SOB w/ activity, NYHA Class II-early III. +fatigue. Denies CP.  Has been out of all meds x 3 weeks. Asking for insulin refills. BP 104/78. EKG shows NSR w/ RAE (seen on prior EKGs), 88 bpm, QT/QTc 390/471 ms. ReDs 30%. Still smoking 1 ppd. Denies cocaine use.    Cardiac Testing   - Echo (5/23): EF 40-45%, RV normal, no LV thrombus  - Echo 3/23 with global HK EF 25-30%, small LV thrombus. 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     losartan (COZAAR) 25 MG tablet Take 0.5 tablets (12.5 mg total) by mouth daily. 15 tablet 6   blood glucose meter kit and supplies KIT Dispense based on patient and insurance preference. Use up to four times daily as directed. 1 each 0   empagliflozin (JARDIANCE) 10 MG TABS tablet Take 1 tablet (10 mg total) by mouth daily before breakfast. 30 tablet 6   insulin aspart (NOVOLOG) 100 UNIT/ML injection For blood sugars 0-150 give 0 units of insulin, 200-250 give 4 units of insulin, 251-300 give 6 units, 301-350 give 8 units, 351- 400 give 10 units, > 400 give 12 units and call M.D. Discussed hypoglycemia protocol. 10 mL PRN   Insulin Pen Needle (PEN NEEDLES 31GX5/16") 31G X 8 MM MISC As directed 100 each 1   ondansetron (ZOFRAN) 4 MG tablet Take 1 tablet (4 mg total) by mouth every 8 (eight) hours as needed for nausea or vomiting. 20 tablet 0   ondansetron (ZOFRAN-ODT) 4 MG disintegrating tablet Take 1 tablet (4 mg total) by mouth every 8 (eight) hours as needed for nausea or vomiting. 8 tablet 0   pantoprazole (PROTONIX) 40 MG tablet Take 1 tablet (40 mg total) by mouth daily. 30 tablet 0   polyethylene glycol (MIRALAX / GLYCOLAX) 17 g  packet Take 17 g by mouth daily as needed for mild constipation. 14 each 0   spironolactone (ALDACTONE) 25 MG tablet Take 0.5 tablets (12.5 mg total) by mouth daily. 15 tablet 6   No current facility-administered medications for this encounter.   No Known Allergies  Social History   Socioeconomic History   Marital status: Married    Spouse name: Not on file   Number of children: Not on file   Years of education: Not on file   Highest education level: Not on file  Occupational History   Not on file  Tobacco Use   Smoking status: Every Day    Packs/day: 1.00    Years: 10.00    Total pack years: 10.00    Types: Cigarettes   Smokeless tobacco: Never  Vaping Use   Vaping Use: Never  used  Substance and Sexual Activity   Alcohol use: Yes    Alcohol/week: 30.0 standard drinks of alcohol    Types: 30 Shots of liquor per week    Comment: minimum 4 shots of liquor daily   Drug use: Yes    Types: Oxycodone, Marijuana   Sexual activity: Yes    Birth control/protection: Condom  Other Topics Concern   Not on file  Social History Narrative   Not on file   Social Determinants of Health   Financial Resource Strain: High Risk (05/24/2021)   Overall Financial Resource Strain (CARDIA)    Difficulty of Paying Living Expenses: Hard  Food Insecurity: No Food Insecurity (05/24/2021)   Hunger Vital Sign    Worried About Running Out of Food in the Last Year: Never true    Ran Out of Food in the Last Year: Never true  Transportation Needs: No Transportation Needs (05/24/2021)   PRAPARE - Hydrologist (Medical): No    Lack of Transportation (Non-Medical): No  Physical Activity: Not on file  Stress: Not on file  Social Connections: Not on file  Intimate Partner Violence: Not on file   Family History  Problem Relation Age of Onset   Diabetes Father    BP 104/78   Pulse 86   Wt 62.3 kg (137 lb 6.4 oz)   SpO2 98%   BMI 22.18 kg/m   Wt Readings from Last 3 Encounters:  10/31/21 62.3 kg (137 lb 6.4 oz)  07/19/21 67.1 kg (148 lb)  07/06/21 69 kg (152 lb 3.2 oz)   PHYSICAL EXAM: ReDs 30%  General:  thin young male No respiratory difficulty HEENT: normal Neck: supple. no JVD. Carotids 2+ bilat; no bruits. No lymphadenopathy or thyromegaly appreciated. Cor: PMI nondisplaced. Regular rate & rhythm. No rubs, gallops or murmurs. Lungs: clear Abdomen: soft, nontender, nondistended. No hepatosplenomegaly. No bruits or masses. Good bowel sounds. Extremities: no cyanosis, clubbing, rash, edema Neuro: alert & oriented x 3, cranial nerves grossly intact. moves all 4 extremities w/o difficulty. Affect pleasant.   ASSESSMENT & PLAN:  1.  Chronic  systolic heart failure, NICM  - Echo (3/23): EF 25-30% Small LV thrombus. No cMRI with no insurance. - Cath (3/23): normal cors and stable hemodynamics. - Echo (5/23): EF 40-45% RV normal - NYHA Class II-early III symptoms, in setting of poor med compliance. Stopped taking meds 3 wks ago  - Euvolemic on exam, ReDS 30%. BP soft 104/78 - Restart Jardiance 10 mg daily  - Restart Spironolactone 12.5 mg daily  - Restart Losartan at low dose 12.5 mg qhs (previously on 50 mg, felt bad  w/ increased dizziness w/ this dose) - Hold off on restarting coreg for now, given soft BP. Add back next visit if BP stable  - refer to paramedicine - f/u w/ APP or PharmD in 1 wk  - check BMP and BNP today    2. LV Thrombus - Resolved on f/u echo 5/23 with improvement in LV function - No longer on Eliquis    3. Cocaine Abuse/Tobacco abuse - Says he is not using cocaine - Continue to smoke 1 ppd.  - Discussed need to quit   4. DMII - last Hgb A1c 6.7  - followed by PCP  - Continue SGLT2i    5. Prolonged QT  - Stable on EKG today, QT/QTc 390/471 ms   6. HTN  - controlled  - Continue GDMT as above.  7. Poor Compliance/ Low Health Literacy  - refer to para medicine   F/u w/ PharmD or APP  in 7-10 days   Lyda Jester, PA-C  4:19 PM

## 2021-10-31 NOTE — Patient Instructions (Addendum)
Thank you for coming in today  Labs were done today, if any labs are abnormal the clinic will call you No news is good news  RESTART Losartan 12.5 mg 1/2 tablet daily  RESTART Spironolactone 12.5 mg 1/2 tablet daily RESTART Jardiance 10 mg 1 tablet daily   Your physician recommends that you schedule a follow-up appointment in:  1-2 weeks with clinic     Do the following things EVERYDAY: Weigh yourself in the morning before breakfast. Write it down and keep it in a log. Take your medicines as prescribed Eat low salt foods--Limit salt (sodium) to 2000 mg per day.  Stay as active as you can everyday Limit all fluids for the day to less than 2 liters  At the [facility], you and your health needs are our priority. As part of our continuing mission to provide you with exceptional heart care, we have created designated Provider Care Teams. These Care Teams include your primary Cardiologist (physician) and Advanced Practice Providers (APPs- Physician Assistants and Nurse Practitioners) who all work together to provide you with the care you need, when you need it.   You may see any of the following providers on your designated Care Team at your next follow up: Dr Arvilla Meres Dr Marca Ancona Dr. Marcos Eke, NP Robbie Lis, [place] [facility] [place], [place] Brynda Peon, NP Karle Plumber, PharmD   Please be sure to bring in all your medications bottles to every appointment.   If you have any questions or concerns before your next appointment please send [place] a message through [place] or call our office at [phone].    TO LEAVE A MESSAGE FOR THE NURSE SELECT OPTION 2, PLEASE LEAVE A MESSAGE INCLUDING: YOUR NAME DATE OF BIRTH CALL BACK NUMBER REASON FOR CALL**this is important as we prioritize the call backs  YOU WILL RECEIVE A CALL BACK THE SAME DAY AS LONG AS YOU CALL BEFORE 4:00 PM

## 2021-10-31 NOTE — Progress Notes (Signed)
ReDS Vest / Clip - 10/31/21 1500       ReDS Vest / Clip   Station Marker C    Ruler Value 25    ReDS Value Range Low volume    ReDS Actual Value 30

## 2021-10-31 NOTE — Progress Notes (Signed)
Heart and Vascular Care Navigation  10/31/2021  David [name] 06-13-1980 [id number]  Reason for Referral:  Patient is participating in a Managed Medicaid Plan:Yes  Engaged with patient face to face for initial visit for Heart and Vascular Care Coordination.                                                                                                   Paramedicine Initial Assessment:  Housing:  In what kind of housing do you live? House/apt/trailer/shelter? house  Do you live with anyone? Wife and infant son  Are you currently worried about losing your housing? no   Social:  What is your current marital status? married  Do you have any children? son   Income:  What is your current source of income? None- hasn't worked in about 2 years.  Hasn't applied for disability yet- planning on going to [facility] ASAP  How hard is it for you to pay for the basics like food housing, medical care, and utilities? Hard- but no outstanding bills at this time  Insurance:  Are you currently insured? Medicaid- children and family medicaid- started getting about 3 months ago  Do you have prescription coverage? yes  Transportation:  Do you have transportation to your medical appointments? Yes has his own car   Daily Health Needs: Do you have a working scale at home? yes  How do you manage your medications at home? Has a pill box  Do you ever take your medications differently than prescribed? Sometimes forgets to take  Do you have issues affording your medications? no  If yes, has this ever prevented you from obtaining medications? N/a  Do you have any concerns with mobility at home? no  Do you have a PCP? Yes with [facility]  Are there any additional barriers you see to getting the care you need? None reported  CSW will continue to follow through paramedicine program and assist as needed.                                  HRT/VAS Care Coordination     Patients Home  Cardiology Office [facility] Failure Clinic   Living arrangements for the past 2 months Apartment   Lives with: Minor Children; Spouse   Patient Current Insurance Coverage Self-Pay   Patient Has Concern With Paying Medical Bills Yes   Medical Bill Referrals: financial counseling for Medicaid screening   Does Patient Have Prescription Coverage? No   Patient Prescription Assistance Programs Heart Failure [facility] Assistive Devices/Equipment None       Social History:  SDOH Screenings   Alcohol Screen: Not on file  Depression (PHQ2-9): Low Risk  (01/13/2021)   Depression (PHQ2-9)    PHQ-2 Score: 0  Financial Resource Strain: High Risk (05/24/2021)   Overall Financial Resource Strain (CARDIA)    Difficulty of Paying Living Expenses: Hard  Food Insecurity: No Food Insecurity (05/24/2021)   Hunger Vital Sign    Worried About Running Out of Food in the Last Year: Never true    Ran Out of Food in the Last Year: Never true  Housing: Low Risk  (05/24/2021)   Housing    Last Housing Risk Score: 0  Physical Activity: Not on file  Social Connections: Not on file  Stress: Not on file  Tobacco Use: High Risk (10/31/2021)   Patient History    Smoking Tobacco Use: Every Day    Smokeless Tobacco Use: Never    Passive Exposure: Not on file  Transportation Needs: No Transportation Needs (05/24/2021)   PRAPARE - Transportation    Lack of Transportation (Medical): No    Lack of Transportation (Non-Medical): No      Follow-up plan:    CSW sent referral to paramedics for assignment and follow up.  Will continue to follow and assist as needed  Burna Sis, LCSW Clinical Social Worker [facility] Desk#: [phone] Cell#: [phone]

## 2021-10-31 NOTE — Addendum Note (Signed)
Encounter addended by: Burna Sis, LCSW on: 10/31/2021 4:47 PM  Actions taken: Flowsheet accepted, Clinical Note Signed

## 2021-11-03 ENCOUNTER — Telehealth (HOSPITAL_COMMUNITY): Payer: Self-pay | Admitting: Licensed Clinical Social Worker

## 2021-11-03 NOTE — Telephone Encounter (Signed)
CSW received call back from pt wife to discuss Paramedic meeting with pt at alternative location.  Wife reports this won't be a problem and they can bring pt to clinic to meet with paramedic when needed.  Paramedic informed and will reach out to set up initial visit.  Burna Sis, LCSW Clinical Social Worker [facility] Desk#: [phone] Cell#: [phone]

## 2021-11-03 NOTE — Telephone Encounter (Signed)
CSW calling pt to follow up re [employer] program.  Per paramedic pt [place] in a very high [place] area which is not safe to visit- CSW called pt to discuss meeting at a different location such as the clinic- unable to reach or leave VM- texted pt to request return call  Burna Sis, LCSW Clinical Social Worker [facility] Desk#: [phone] Cell#: [phone]

## 2021-11-08 ENCOUNTER — Telehealth (HOSPITAL_COMMUNITY): Payer: Self-pay

## 2021-11-08 NOTE — Telephone Encounter (Signed)
Contacted pt regarding appointment, his wife advised he wasn't home and for me to call the other number.  I called his cell phone but no answer and the VM has not been set up so I wasn't able to LVM. I sent him a text and he did call me right back.  He advised he has not been taking meds like hes suppose to and yesterday he wasn't feeling well with n/v.   He is able to meet me in the morning at 9 in clinic area.    Kerry Hough, EMT-Paramedic  [phone] 11/08/2021

## 2021-11-09 ENCOUNTER — Other Ambulatory Visit (HOSPITAL_COMMUNITY): Payer: Self-pay

## 2021-11-09 NOTE — Progress Notes (Signed)
Arrived at our appointment time at clinic like we spoke about yesterday, he did not show up. I called him, no answer, I also texted him and no response.  After waiting 20 min and no response, I left.  Unable to wait longer.   Kerry Hough, EMT-Paramedic  [phone] 11/09/2021

## 2021-11-14 ENCOUNTER — Telehealth (HOSPITAL_COMMUNITY): Payer: Self-pay

## 2021-11-14 NOTE — Progress Notes (Signed)
ADVANCED HF CLINIC PROGRESS NOTE   Primary Care: Kerin Perna, NP Primary Cardiologist: Dr. Harrell Gave HF Cardiologist: Dr. Haroldine Laws  HPI: Mr Asfaw is a 41 y.o. with h/o  LV thrombus, GI bleed, gastritis, cocaine abuse, DMII, HTN, QQT prolongation, and systolic/diastolic HF.    Admitted 05/13/21 with N/V followed by syncope. On admit had AKI, hypokalemia, and hypocalcemia. UDS + cocaine and opitates. ECHO with EF 25-30%. Cath preserved cardiac output and normal cors. Started on GDMT. Started on DOAC for possible LV thrombus.    Seen in [facility] 3/23, volume up, NYHA III symptoms. Arlyce Harman and [place] added. Referred to the [facility].   Echo 07/06/21: EF 40-45% RV normal No LV thrombus.   Admitted 5/23 with syncope and AKI, UDS + cocaine. Given IVF and GDMT held. Cards consulted due to elevated troponin. With no CP, elevated trop felt 2/2 to AKI and hypotension. Carvedilol resumed, discharged home, weight 148 lbs. Seen for post hospital f/u 5/23 and was doing well.   Follow up 10/31/21, NYHA III and out of all meds x 3 weeks. Meds restarted and referred to Paramedicine.  Today he returns for HF follow up with paramedicine and his wife and 42 year old son. He is not taking his medications, says car broke down and couldn't get to pharmacy. Previously vomiting every 4 days while taking meds and feels better off of them. Main complaint is daytime fatigue. Does not sleep much at night, wife says he snores. He is not SOB with activity. Denies palpitations, CP, dizziness, edema, or PND/Orthopnea. Appetite ok. No fever or chills. He does not weigh at home. No ETOH, no further cocaine use.  Cardiac Testing   - Echo (5/23): EF 40-45%, RV normal, no LV thrombus  - Echo (3/23): EF 25-30%  with global HK, small LV thrombus. 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 (Patient not taking: Reported on 11/15/2021)     blood glucose meter kit and supplies KIT Dispense based on patient and insurance preference. Use up to four times daily as directed. (Patient not taking: Reported on 11/15/2021) 1 each 0   empagliflozin (JARDIANCE) 10 MG TABS tablet Take 1 tablet (10 mg total) by mouth daily before breakfast. (Patient not taking: Reported on 11/15/2021) 30 tablet 6   insulin aspart (NOVOLOG) 100 UNIT/ML injection For blood sugars 0-150 give 0 units of insulin, 200-250 give 4 units of insulin, 251-300 give 6 units, 301-350 give 8 units, 351- 400 give 10 units, > 400 give 12 units and call M.D. Discussed hypoglycemia protocol. (Patient not taking: Reported on 10/31/2021) 10 mL PRN   Insulin Pen Needle (PEN NEEDLES 31GX5/16") 31G X 8 MM MISC As directed (Patient not taking: Reported on 10/31/2021) 100 each 1   losartan (COZAAR) 25 MG tablet Take 0.5 tablets (12.5 mg total) by mouth daily. (Patient not taking: Reported on 11/15/2021) 15 tablet 6   ondansetron (ZOFRAN) 4 MG tablet  Take 1 tablet (4 mg total) by mouth every 8 (eight) hours as needed for nausea or vomiting. (Patient not taking: Reported on 10/31/2021) 20 tablet 0   ondansetron (ZOFRAN-ODT) 4 MG disintegrating tablet Take 1 tablet (4 mg total) by mouth every 8 (eight) hours as needed for nausea or vomiting. (Patient not taking: Reported on 10/31/2021) 8 tablet 0   pantoprazole (PROTONIX) 40 MG tablet Take 1 tablet (40 mg total) by mouth daily. (Patient not taking: Reported on 10/31/2021) 30 tablet 0   polyethylene glycol (MIRALAX / GLYCOLAX) 17 g packet Take 17 g by mouth  daily as needed for mild constipation. (Patient not taking: Reported on 10/31/2021) 14 each 0   spironolactone (ALDACTONE) 25 MG tablet Take 0.5 tablets (12.5 mg total) by mouth daily. (Patient not taking: Reported on 11/15/2021) 15 tablet 6   No current facility-administered medications for this encounter.   No Known Allergies  Social History   Socioeconomic History   Marital status: Married    Spouse name: Not on file   Number of children: Not on file   Years of education: Not on file   Highest education level: Not on file  Occupational History   Not on file  Tobacco Use   Smoking status: Every Day    Packs/day: 1.00    Years: 10.00    Total pack years: 10.00    Types: Cigarettes   Smokeless tobacco: Never  Vaping Use   Vaping Use: Never used  Substance and Sexual Activity   Alcohol use: Yes    Alcohol/week: 30.0 standard drinks of alcohol    Types: 30 Shots of liquor per week    Comment: minimum 4 shots of liquor daily   Drug use: Yes    Types: Oxycodone, Marijuana   Sexual activity: Yes    Birth control/protection: Condom  Other Topics Concern   Not on file  Social History Narrative   Not on file   Social Determinants of Health   Financial Resource Strain: High Risk (10/31/2021)   Overall Financial Resource Strain (CARDIA)    Difficulty of Paying Living Expenses: Hard  Food Insecurity: No Food Insecurity (10/31/2021)   Hunger Vital Sign    Worried About Running Out of Food in the Last Year: Never true    Ran Out of Food in the Last Year: Never true  Transportation Needs: No Transportation Needs (10/31/2021)   PRAPARE - Hydrologist (Medical): No    Lack of Transportation (Non-Medical): No  Physical Activity: Not on file  Stress: Not on file  Social Connections: Not on file  Intimate Partner Violence: Not on file   Family History  Problem Relation Age of Onset   Diabetes Father    BP 120/80   Pulse 75   Wt 62.1 kg (137 lb)    SpO2 98%   BMI 22.11 kg/m   Wt Readings from Last 3 Encounters:  11/15/21 62.1 kg (137 lb)  10/31/21 62.3 kg (137 lb 6.4 oz)  07/19/21 67.1 kg (148 lb)   PHYSICAL EXAM: General:  NAD. No resp difficulty, falling asleep during visit HEENT: Normal Neck: Supple. No JVD. Carotids 2+ bilat; no bruits. No lymphadenopathy or thryomegaly appreciated. Cor: PMI nondisplaced. Regular rate & rhythm. No rubs, gallops or murmurs. Lungs: Clear Abdomen: Soft, nontender, nondistended. No hepatosplenomegaly. No bruits or masses. Good bowel sounds. Extremities: No cyanosis, clubbing, rash, edema Neuro: Alert & oriented x 3, cranial nerves grossly intact. Moves all 4 extremities  w/o difficulty. Affect pleasant.  ASSESSMENT & PLAN: 1.  Chronic systolic heart failure, NICM  - Echo (3/23): EF 25-30% Small LV thrombus. No cMRI with no insurance. - Cath (3/23): normal cors and stable hemodynamics. - Echo (5/23): EF 40-45% RV normal - NYHA II. Volume looks good today. - Restart Jardiance 10 mg daily.  - Restart spironolactone 12.5 mg qhs - Restart losartan 12.5 mg qhs (previously on 50 mg, felt bad w/ increased dizziness w/ this dose) - Continue paramedicine. - Add back Coreg next visit. - Labs next visit. - I asked him to pick up his meds today from pharmacy.   2. LV Thrombus - Resolved on f/u echo 5/23 with improvement in LV function - No longer on Eliquis    3. Cocaine Abuse/Tobacco abuse - Says he is not using cocaine - Continue to smoke 1/2 ppd.  - Discussed need to quit   4. DMII - Last Hgb A1c 6.7  - followed by PCP  - Continue SGLT2i    5. Prolonged QT  - Stable on recent ECG (10/31/21, QT/QTc 390/471 ms)  6. HTN  - Controlled . - GDMT as above.  7. Daytime fatigue/snoring - Arrange in-lab sleep study  8. Poor Compliance/ Low Health Literacy  - Continue paramedicine - He has a scale, transportation and cell phone. - Wife appears motivated to help with med management. - He  has insurance.  9. Vomiting - Sounds like reflux - Needs to get back on PPI - Advised follow up with PCP. May need GI referral.  Follow up in 2 weeks with APP.  Rafael Bihari, FNP  2:25 PM

## 2021-11-14 NOTE — Telephone Encounter (Signed)
Called and spoke to patient's wife to confirm/remind patient of their appointment at the [facility] on 11/15/21.   Patient reminded to bring all medications and/or complete list.

## 2021-11-15 ENCOUNTER — Encounter (HOSPITAL_COMMUNITY): Payer: Self-pay

## 2021-11-15 ENCOUNTER — Ambulatory Visit (HOSPITAL_COMMUNITY)
Admission: RE | Admit: 2021-11-15 | Discharge: 2021-11-15 | Disposition: A | Discharge status: Home / Self Care | Payer: Medicaid Other | Source: Ambulatory Visit | Attending: Family Medicine | Admitting: Family Medicine

## 2021-11-15 ENCOUNTER — Other Ambulatory Visit (HOSPITAL_COMMUNITY): Payer: Self-pay

## 2021-11-15 VITALS — BP 120/80 | HR 75 | Wt 137.0 lb

## 2021-11-15 DIAGNOSIS — I513 Intracardiac thrombosis, not elsewhere classified: Secondary | ICD-10-CM | POA: Diagnosis not present

## 2021-11-15 DIAGNOSIS — Z794 Long term (current) use of insulin: Secondary | ICD-10-CM

## 2021-11-15 DIAGNOSIS — E1140 Type 2 diabetes mellitus with diabetic neuropathy, unspecified: Secondary | ICD-10-CM | POA: Insufficient documentation

## 2021-11-15 DIAGNOSIS — Z79899 Other long term (current) drug therapy: Secondary | ICD-10-CM | POA: Diagnosis not present

## 2021-11-15 DIAGNOSIS — F17210 Nicotine dependence, cigarettes, uncomplicated: Secondary | ICD-10-CM | POA: Diagnosis not present

## 2021-11-15 DIAGNOSIS — R9431 Abnormal electrocardiogram [ECG] [EKG]: Secondary | ICD-10-CM

## 2021-11-15 DIAGNOSIS — I5042 Chronic combined systolic (congestive) and diastolic (congestive) heart failure: Secondary | ICD-10-CM | POA: Insufficient documentation

## 2021-11-15 DIAGNOSIS — F1410 Cocaine abuse, uncomplicated: Secondary | ICD-10-CM | POA: Diagnosis not present

## 2021-11-15 DIAGNOSIS — R55 Syncope and collapse: Secondary | ICD-10-CM | POA: Insufficient documentation

## 2021-11-15 DIAGNOSIS — I110 Hypertensive heart disease with heart failure: Secondary | ICD-10-CM | POA: Diagnosis present

## 2021-11-15 DIAGNOSIS — E119 Type 2 diabetes mellitus without complications: Secondary | ICD-10-CM

## 2021-11-15 DIAGNOSIS — E876 Hypokalemia: Secondary | ICD-10-CM | POA: Diagnosis not present

## 2021-11-15 DIAGNOSIS — I10 Essential (primary) hypertension: Secondary | ICD-10-CM

## 2021-11-15 DIAGNOSIS — I428 Other cardiomyopathies: Secondary | ICD-10-CM | POA: Insufficient documentation

## 2021-11-15 DIAGNOSIS — R0683 Snoring: Secondary | ICD-10-CM

## 2021-11-15 DIAGNOSIS — R5383 Other fatigue: Secondary | ICD-10-CM | POA: Diagnosis not present

## 2021-11-15 DIAGNOSIS — Z91148 Patient's other noncompliance with medication regimen for other reason: Secondary | ICD-10-CM

## 2021-11-15 DIAGNOSIS — Z7984 Long term (current) use of oral hypoglycemic drugs: Secondary | ICD-10-CM | POA: Insufficient documentation

## 2021-11-15 DIAGNOSIS — I5020 Unspecified systolic (congestive) heart failure: Secondary | ICD-10-CM | POA: Diagnosis not present

## 2021-11-15 DIAGNOSIS — R1110 Vomiting, unspecified: Secondary | ICD-10-CM

## 2021-11-15 DIAGNOSIS — Z556 Problems related to health literacy: Secondary | ICD-10-CM | POA: Insufficient documentation

## 2021-11-15 DIAGNOSIS — Z8719 Personal history of other diseases of the digestive system: Secondary | ICD-10-CM | POA: Diagnosis not present

## 2021-11-15 NOTE — Patient Instructions (Signed)
PLEASE RESTART ALL YOUR MEDICATIONS.  Your provider has order an in lab sleep study test for you. ONCE APPROVED BY YOUR INSURANCE YOU WILL BE CALLED TO ARRANGE THE TEST.  Your physician recommends that you schedule a follow-up appointment in: 2 weeks  If you have any questions or concerns before your next appointment please send [place] a message through [place] or call our office at [phone].    TO LEAVE A MESSAGE FOR THE NURSE SELECT OPTION 2, PLEASE LEAVE A MESSAGE INCLUDING: YOUR NAME DATE OF BIRTH CALL BACK NUMBER REASON FOR CALL**this is important as we prioritize the call backs  YOU WILL RECEIVE A CALL BACK THE SAME DAY AS LONG AS YOU CALL BEFORE 4:00 PM  At the [facility], you and your health needs are our priority. As part of our continuing mission to provide you with exceptional heart care, we have created designated Provider Care Teams. These Care Teams include your primary Cardiologist (physician) and Advanced Practice Providers (APPs- Physician Assistants and Nurse Practitioners) who all work together to provide you with the care you need, when you need it.   You may see any of the following providers on your designated Care Team at your next follow up: Dr Arvilla Meres Dr Marca Ancona Dr. Marcos Eke, NP Robbie Lis, [place] [facility] [place], [place] Brynda Peon, NP Karle Plumber, PharmD   Please be sure to bring in all your medications bottles to every appointment.

## 2021-11-15 NOTE — Progress Notes (Addendum)
Paramedicine Encounter   Patient ID: David [name] , male,   DOB: 28-Apr-1980,41 y.o.,  MRN: [id number]  First visit with pt in the clinic.  He is here with wife and child.  Wife reports he has not been taking any meds in the past 8 days and he has felt good.   Wife reports every 4 days while he was taking the meds-  He was getting n/v.  He appears sleepy today during visit.  Wife reports his appetite comes and goes.  He does have a long history of gastritis and n/v.  Wife reports he doesn't sleep at night-he may fall asleep when the sun comes up and then up again a couple hrs later.   Restart meds today-[facility]  Start the spiro and losartan at night time.   Smoking 1/2 pack a day.  Denies drug or alcohol use.    His moms address is on the [address]  Wife told me their physical home address but did not want it listed in the chart. I will be meeting them at this other address rather than [address]    He has PCP is at [facility]. Was last seen there about a month or so ago. I asked them to reach out to either sch appoint or see if they can get referral to GI doc over the phone.   Will f/u next week.    Met patient in clinic today with provider.  Weight @ clinic-137 B/P-120/80 P-75 [id number]  Med changes-none-restart all meds  Marylouise Stacks, EMT-Paramedic [phone] 11/15/2021

## 2021-11-23 ENCOUNTER — Other Ambulatory Visit (INDEPENDENT_AMBULATORY_CARE_PROVIDER_SITE_OTHER): Payer: Self-pay | Admitting: Primary Care

## 2021-11-23 ENCOUNTER — Other Ambulatory Visit (HOSPITAL_COMMUNITY): Payer: Self-pay

## 2021-11-23 NOTE — Progress Notes (Unsigned)
Paramedicine Encounter    Patient ID: David [name], male    DOB: 01/20/1981, 41 y.o.   MRN: [id number]  Pt reports he was not feeling good yesterday-he was throwing up again. He feels somewhat better today, no vomiting, no diarrhea, no fevers. Some abd pains. Denies any blood in stool. He has not taken any meds.  Appetite still coming and going.  Denies any alcohol or drug use.   He said his mom reached out to PCP regarding appointment.    [facility] and got them to fill his meds--they didn't have his insulin rx so not sure where he got this from in the past.  He is very med non-compliant. I advised him of the extremely dangerous high bp he has right now.  When asked why dont he take his meds-he couldn't give a reason.  He called his wife to get her to come back home and they would go get his meds from pharmacy.   BP (!) 190/130   Pulse 96   Resp 16   SpO2 98%  Weight yesterday-? Last visit weight-137 @ clinic   Patient Care Team: Edwards, Michelle P, NP as PCP - General (Internal Medicine) Christopher, Bridgette, MD as PCP - Cardiology (Cardiology)  Patient Active Problem List   Diagnosis Date Noted   Syncope 07/19/2021   HFrEF (heart failure with reduced ejection fraction) (HCC) 07/19/2021   Acute systolic heart failure (HCC)    Hypokalemia 05/13/2021   Hypomagnesemia 05/13/2021   Hypocalcemia 05/13/2021   Gastritis 09/22/2020   Upper GI bleeding 09/21/2020   Nicotine dependence, cigarettes, uncomplicated 09/21/2020   Acute epigastric pain 09/21/2020   Polysubstance abuse (HCC) 08/16/2020   Near syncope 08/15/2020   AKI (acute kidney injury) (HCC) 08/15/2020   Opioid dependence with withdrawal (HCC) 08/15/2020   Alcohol withdrawal (HCC) 04/05/2016   Hypertension associated with diabetes (HCC) 04/05/2016   Type 2 diabetes mellitus without complication, with long-term current use of insulin (HCC)     Current Outpatient Medications:     acetaminophen (TYLENOL) 500 MG tablet, Take 500 mg by mouth every 6 (six) hours as needed for moderate pain. (Patient not taking: Reported on 11/15/2021), Disp: , Rfl:    blood glucose meter kit and supplies KIT, Dispense based on patient and insurance preference. Use up to four times daily as directed. (Patient not taking: Reported on 11/15/2021), Disp: 1 each, Rfl: 0   empagliflozin (JARDIANCE) 10 MG TABS tablet, Take 1 tablet (10 mg total) by mouth daily before breakfast. (Patient not taking: Reported on 11/15/2021), Disp: 30 tablet, Rfl: 6   insulin aspart (NOVOLOG) 100 UNIT/ML injection, For blood sugars 0-150 give 0 units of insulin, 200-250 give 4 units of insulin, 251-300 give 6 units, 301-350 give 8 units, 351- 400 give 10 units, > 400 give 12 units and call M.D. Discussed hypoglycemia protocol. (Patient not taking: Reported on 10/31/2021), Disp: 10 mL, Rfl: PRN   Insulin Pen Needle (PEN NEEDLES 31GX5/16") 31G X 8 MM MISC, As directed (Patient not taking: Reported on 10/31/2021), Disp: 100 each, Rfl: 1   losartan (COZAAR) 25 MG tablet, Take 0.5 tablets (12.5 mg total) by mouth daily. (Patient not taking: Reported on 11/15/2021), Disp: 15 tablet, Rfl: 6   ondansetron (ZOFRAN) 4 MG tablet, Take 1 tablet (4 mg total) by mouth every 8 (eight) hours as needed for nausea or vomiting. (Patient not taking: Reported on 10/31/2021), Disp: 20 tablet, Rfl: 0   ondansetron (ZOFRAN-ODT) 4 MG   disintegrating tablet, Take 1 tablet (4 mg total) by mouth every 8 (eight) hours as needed for nausea or vomiting. (Patient not taking: Reported on 10/31/2021), Disp: 8 tablet, Rfl: 0   pantoprazole (PROTONIX) 40 MG tablet, Take 1 tablet (40 mg total) by mouth daily. (Patient not taking: Reported on 10/31/2021), Disp: 30 tablet, Rfl: 0   polyethylene glycol (MIRALAX / GLYCOLAX) 17 g packet, Take 17 g by mouth daily as needed for mild constipation. (Patient not taking: Reported on 10/31/2021), Disp: 14 each, Rfl: 0   spironolactone  (ALDACTONE) 25 MG tablet, Take 0.5 tablets (12.5 mg total) by mouth daily. (Patient not taking: Reported on 11/15/2021), Disp: 15 tablet, Rfl: 6 No Known Allergies    Social History   Socioeconomic History   Marital status: Married    Spouse name: Not on file   Number of children: Not on file   Years of education: Not on file   Highest education level: Not on file  Occupational History   Not on file  Tobacco Use   Smoking status: Every Day    Packs/day: 1.00    Years: 10.00    Total pack years: 10.00    Types: Cigarettes   Smokeless tobacco: Never  Vaping Use   Vaping Use: Never used  Substance and Sexual Activity   Alcohol use: Yes    Alcohol/week: 30.0 standard drinks of alcohol    Types: 30 Shots of liquor per week    Comment: minimum 4 shots of liquor daily   Drug use: Yes    Types: Oxycodone, Marijuana   Sexual activity: Yes    Birth control/protection: Condom  Other Topics Concern   Not on file  Social History Narrative   Not on file   Social Determinants of Health   Financial Resource Strain: High Risk (10/31/2021)   Overall Financial Resource Strain (CARDIA)    Difficulty of Paying Living Expenses: Hard  Food Insecurity: No Food Insecurity (10/31/2021)   Hunger Vital Sign    Worried About Running Out of Food in the Last Year: Never true    Ran Out of Food in the Last Year: Never true  Transportation Needs: No Transportation Needs (10/31/2021)   PRAPARE - Hydrologist (Medical): No    Lack of Transportation (Non-Medical): No  Physical Activity: Not on file  Stress: Not on file  Social Connections: Not on file  Intimate Partner Violence: Not on file    Physical Exam      Future Appointments  Date Time Provider [place]  12/01/2021  2:00 PM [place] None  12/18/2021  8:00 PM Troy Sine, MD [facility]       Marylouise Stacks, [place] Paramedic  11/23/21

## 2021-11-24 NOTE — Telephone Encounter (Signed)
Will forward to provider  

## 2021-11-24 NOTE — Telephone Encounter (Signed)
Requested medication (s) are due for refill today: yes  Requested medication (s) are on the active medication list: yes  Last refill:  05/17/21 #30/0  Future visit scheduled: no  Notes to clinic:  Unable to refill per protocol, last refill by another provider.    Requested Prescriptions  Pending Prescriptions Disp Refills   pantoprazole (PROTONIX) 40 MG tablet 30 tablet 0    Sig: Take 1 tablet (40 mg total) by mouth daily.     Gastroenterology: Proton Pump Inhibitors Passed - 11/23/2021  3:17 PM      Passed - Valid encounter within last 12 months    Recent Outpatient Visits           10 months ago Essential hypertension   [place] Kerin Perna, NP   1 year ago Type 2 diabetes mellitus without complication, without long-term current use of insulin ([place])   [place], [place], NP   1 year ago Primary hypertension   [place] Scot Jun, FNP   1 year ago Primary hypertension   [facility] Kerin Perna, NP   4 years ago Type 2 diabetes mellitus without complication, without long-term current use of insulin [facility])   [place], [place], [place]

## 2021-11-25 ENCOUNTER — Other Ambulatory Visit (HOSPITAL_COMMUNITY): Payer: Self-pay

## 2021-12-01 ENCOUNTER — Other Ambulatory Visit (HOSPITAL_COMMUNITY): Payer: Self-pay

## 2021-12-01 ENCOUNTER — Encounter (HOSPITAL_COMMUNITY): Payer: Medicaid Other

## 2021-12-01 NOTE — Progress Notes (Signed)
Paramedicine Encounter   Patient ID: David [name] , male,   DOB: [birth year],41 y.o.,  MRN: [id number]  No show to clinic appointment.  Spoke to wife earlier this week and advised he would be here.   Marylouise Stacks, [place] 12/01/2021

## 2021-12-05 ENCOUNTER — Telehealth (HOSPITAL_COMMUNITY): Payer: Self-pay

## 2021-12-05 NOTE — Telephone Encounter (Signed)
Attempted to reach out to pt regarding his missed clinic visit last week- I called both phone numbers and no answer-the VM was not set up for me to LVM.    Marylouise Stacks, [place] 12/05/2021

## 2021-12-07 ENCOUNTER — Telehealth (HOSPITAL_COMMUNITY): Payer: Self-pay

## 2021-12-08 NOTE — Telephone Encounter (Signed)
Pts wife had called me back from message I left for them earlier this week.  She reports that his sister passed a few wks ago and he has been dealing with that stress and planning the funeral this week.  Wife reports he got sick once last week, but has been taking A b/p pill.  I advised her that he needs to f/u with PCP and see GI doc and also resch that missed heart clinic appoint too.  She said she would make sure it was done.   Marylouise Stacks, [place] 12/08/2021

## 2021-12-18 ENCOUNTER — Ambulatory Visit (HOSPITAL_BASED_OUTPATIENT_CLINIC_OR_DEPARTMENT_OTHER): Payer: Commercial Managed Care - HMO | Attending: Family Medicine | Admitting: Cardiovascular Disease

## 2021-12-18 DIAGNOSIS — G4733 Obstructive sleep apnea (adult) (pediatric): Secondary | ICD-10-CM | POA: Diagnosis not present

## 2021-12-18 DIAGNOSIS — I5020 Unspecified systolic (congestive) heart failure: Secondary | ICD-10-CM | POA: Insufficient documentation

## 2021-12-18 DIAGNOSIS — R0683 Snoring: Secondary | ICD-10-CM

## 2021-12-20 ENCOUNTER — Encounter (HOSPITAL_BASED_OUTPATIENT_CLINIC_OR_DEPARTMENT_OTHER): Payer: Self-pay | Admitting: Cardiovascular Disease

## 2021-12-20 NOTE — Procedures (Signed)
Patient Name: David [name], David [name] Date: 12/18/2021 Gender: Male D.O.B: Oct 11, 1980 Age (years): 41 Referring Provider: Allena Katz FNP Height (inches): 38 Interpreting Physician: Shelva Majestic MD, ABSM Weight (lbs): 137 RPSGT: Baxter Flattery BMI: 21 MRN: [id number] Neck Size: 14.00  CLINICAL INFORMATION Sleep Study Type: NPSG  Indication for sleep study: Snoring, Witnesses Apnea / Gasping During Sleep  Epworth Sleepiness Score: 4  SLEEP STUDY TECHNIQUE As per the AASM Manual for the Scoring of Sleep and Associated Events v2.3 (April 2016) with a hypopnea requiring 4% desaturations.  The channels recorded and monitored were frontal, central and occipital EEG, electrooculogram (EOG), submentalis EMG (chin), nasal and oral airflow, thoracic and abdominal wall motion, anterior tibialis EMG, snore microphone, electrocardiogram, and pulse oximetry.  MEDICATIONS acetaminophen (TYLENOL) 500 MG tablet empagliflozin (JARDIANCE) 10 MG TABS tablet insulin aspart (NOVOLOG) 100 UNIT/ML injection Insulin Pen Needle (PEN NEEDLES 31GX5/16") 31G X 8 MM MISC losartan (COZAAR) 25 MG tablet ondansetron (ZOFRAN) 4 MG tablet ondansetron (ZOFRAN-ODT) 4 MG disintegrating tablet pantoprazole (PROTONIX) 40 MG tablet polyethylene glycol (MIRALAX / GLYCOLAX) 17 g packet spironolactone (ALDACTONE) 25 MG tablet Medications self-administered by patient taken the night of the study : N/A  SLEEP ARCHITECTURE The study was initiated at 10:56:09 PM and ended at 4:58:08 AM.  Sleep onset time was 21.1 minutes and the sleep efficiency was 72.7%%. The total sleep time was 263 minutes.  Stage REM latency was 225.5 minutes.  The patient spent 1.5%% of the night in stage N1 sleep, 85.0%% in stage N2 sleep, 0.0%% in stage N3 and 13.5% in REM.  Alpha intrusion was absent.  Supine sleep was 21.25%.  RESPIRATORY PARAMETERS The overall apnea/hypopnea index (AHI) was 7.1 per hour. The respiratory  disturbance (RDI) was 12.8/h.  There were 20 total apneas, including 20 obstructive, 0 central and 0 mixed apneas. There were 11 hypopneas and 25 RERAs.  The AHI during Stage REM sleep was 1.7 per hour.  AHI while supine was 23.6 per hour.  The mean oxygen saturation was 95.8%. The minimum SpO2 during sleep was 91.0%.  Moderate snoring was noted during this study.  CARDIAC DATA The 2 lead EKG demonstrated sinus rhythm. The mean heart rate was 81.0 beats per minute. Other EKG findings include: None.  LEG MOVEMENT DATA The total PLMS were 0 with a resulting PLMS index of 0.0. Associated arousal with leg movement index was 0.0 .  IMPRESSIONS - Mild obstructive sleep apnea occurred during this study (AHI 7.1/h; RDI 12.8/h); however, sleep apnea was moderate with supine sleep (AHI 23.6/h). - The patient had minimal or no oxygen desaturation during the study (Min O2 91.0%) - The patient snored with moderate to loud snoring volume. - No cardiac abnormalities were noted during this study. - Clinically significant periodic limb movements did not occur during sleep. No significant associated arousals.  DIAGNOSIS - Obstructive Sleep Apnea (G47.33) - Moderate to loud Snoring  RECOMMENDATIONS - In this patient with cardiovascular comorbidities recommend therapeutic CPAP for treatment of his sleep disordered breathing. Initiate a trial of Auto-PAP with EPR of 3 at 6 - 15 cm of water.  If patient is against CPAP initiation can consider alternative therapy with a customized oral appliance.  - Effort should be made to optimize nasal and oropharyngeal patency.  - The patient should be counseled to avoid supine sleep; consider positional therapy avoiding supine position during sleep. - Avoid alcohol, sedatives and other CNS depressants that may worsen sleep apnea and disrupt normal sleep architecture. -  Sleep hygiene should be reviewed to assess factors that may improve sleep quality. - Weight  management and regular exercise should be initiated or continued if appropriate.  [Electronically signed] 12/20/2021 12:14 PM  Nicki Guadalajara MD, [facility], ABSM Diplomate, American Board of Sleep Medicine  NPI: [id number]  [facility] PH: [phone]   FX: [phone] ACCREDITED BY THE AMERICAN ACADEMY OF SLEEP MEDICINE

## 2021-12-22 ENCOUNTER — Telehealth (HOSPITAL_COMMUNITY): Payer: Self-pay

## 2021-12-22 NOTE — Telephone Encounter (Signed)
Spoke to pt today regarding visit, he went to sleep study this week.  He went to PCP yesterday-not listed in epic-he said they wanted him to see a doctor for his liver but he wasn't given any other information as to who of if a referral was sent-he said he was going back to office and f/u on that.  He reports he has been taking his meds.   I will see him next week.   Marylouise Stacks, [place] 12/22/2021

## 2021-12-28 ENCOUNTER — Telehealth (HOSPITAL_COMMUNITY): Payer: Self-pay

## 2021-12-28 NOTE — Telephone Encounter (Signed)
Attempted to reach pt regarding home visit, called him on both phone lines listed and LVM for him to return my call.   Marylouise Stacks, [place] 12/28/2021

## 2022-01-03 ENCOUNTER — Telehealth: Payer: Self-pay | Admitting: *Deleted

## 2022-01-03 NOTE — Telephone Encounter (Signed)
Patient returned a call to me and was given his sleep study results and recommendations. He did not fully understand what he was told and asked that he put his mom on the phone for me to tell her his results. She was given the results along with the patient on speaker phone. The patient has opted to use the CPAP machine versus a oral appliance. They were informed that a order for CPAP will be sent to [facility]. Once they have received insurance benefit information he will be contacted by them to come in for set up with the RT.

## 2022-01-03 NOTE — Telephone Encounter (Signed)
-----   Message from Thomas A Kelly, MD sent at 12/20/2021 12:19 PM EDT ----- [place] Greis, please notify pt of the results and set up with DME for CPAP. If patient is against a customized oral appliance can be considered. 

## 2022-01-03 NOTE — Telephone Encounter (Signed)
-----   Message from Troy Sine, MD sent at 12/20/2021 12:19 PM EDT ----- Mariann Laster, please notify pt of the results and set up with DME for CPAP. If patient is against a customized oral appliance can be considered.

## 2022-01-03 NOTE — Telephone Encounter (Signed)
Message left to return a cal to discuss sleep study.

## 2022-01-05 ENCOUNTER — Telehealth: Payer: Self-pay | Admitting: *Deleted

## 2022-01-05 NOTE — Telephone Encounter (Signed)
Patient's wife stated to me on 01/03/22 that she will email his insurance information to me. I contacted her today to let her know that as of I still have not received the information. She was advised I cannot send the order for his CPAP machine until I have his correct insurance information. She apologized for the delay and states that she will sent it over to me now.

## 2022-01-09 ENCOUNTER — Telehealth: Payer: Self-pay | Admitting: *Deleted

## 2022-01-09 NOTE — Telephone Encounter (Signed)
Received insurance information via email from the patient's wife. CPAP order faxed to [facility].

## 2022-01-10 ENCOUNTER — Other Ambulatory Visit: Payer: Self-pay

## 2022-01-10 ENCOUNTER — Inpatient Hospital Stay (HOSPITAL_COMMUNITY)
Admission: EM | Admit: 2022-01-10 | Discharge: 2022-01-12 | DRG: 305 | Disposition: A | Discharge status: Home / Self Care | Payer: Commercial Managed Care - HMO | Attending: Internal Medicine | Admitting: Internal Medicine

## 2022-01-10 ENCOUNTER — Other Ambulatory Visit (HOSPITAL_COMMUNITY): Payer: Self-pay

## 2022-01-10 ENCOUNTER — Emergency Department (HOSPITAL_COMMUNITY): Payer: Commercial Managed Care - HMO

## 2022-01-10 ENCOUNTER — Encounter (HOSPITAL_COMMUNITY): Payer: Self-pay

## 2022-01-10 DIAGNOSIS — F1410 Cocaine abuse, uncomplicated: Secondary | ICD-10-CM | POA: Diagnosis present

## 2022-01-10 DIAGNOSIS — F17210 Nicotine dependence, cigarettes, uncomplicated: Secondary | ICD-10-CM | POA: Diagnosis present

## 2022-01-10 DIAGNOSIS — E119 Type 2 diabetes mellitus without complications: Secondary | ICD-10-CM

## 2022-01-10 DIAGNOSIS — R112 Nausea with vomiting, unspecified: Secondary | ICD-10-CM

## 2022-01-10 DIAGNOSIS — Z7984 Long term (current) use of oral hypoglycemic drugs: Secondary | ICD-10-CM | POA: Diagnosis not present

## 2022-01-10 DIAGNOSIS — I5020 Unspecified systolic (congestive) heart failure: Secondary | ICD-10-CM | POA: Diagnosis present

## 2022-01-10 DIAGNOSIS — Z794 Long term (current) use of insulin: Secondary | ICD-10-CM

## 2022-01-10 DIAGNOSIS — E1169 Type 2 diabetes mellitus with other specified complication: Secondary | ICD-10-CM | POA: Diagnosis present

## 2022-01-10 DIAGNOSIS — I160 Hypertensive urgency: Secondary | ICD-10-CM | POA: Diagnosis not present

## 2022-01-10 DIAGNOSIS — Z833 Family history of diabetes mellitus: Secondary | ICD-10-CM

## 2022-01-10 DIAGNOSIS — Z91148 Patient's other noncompliance with medication regimen for other reason: Secondary | ICD-10-CM

## 2022-01-10 DIAGNOSIS — F1910 Other psychoactive substance abuse, uncomplicated: Secondary | ICD-10-CM | POA: Diagnosis present

## 2022-01-10 DIAGNOSIS — I428 Other cardiomyopathies: Secondary | ICD-10-CM | POA: Diagnosis present

## 2022-01-10 DIAGNOSIS — I110 Hypertensive heart disease with heart failure: Secondary | ICD-10-CM | POA: Diagnosis present

## 2022-01-10 DIAGNOSIS — I5042 Chronic combined systolic (congestive) and diastolic (congestive) heart failure: Secondary | ICD-10-CM | POA: Diagnosis present

## 2022-01-10 DIAGNOSIS — E1159 Type 2 diabetes mellitus with other circulatory complications: Secondary | ICD-10-CM | POA: Diagnosis present

## 2022-01-10 DIAGNOSIS — Z79899 Other long term (current) drug therapy: Secondary | ICD-10-CM | POA: Diagnosis not present

## 2022-01-10 DIAGNOSIS — I161 Hypertensive emergency: Principal | ICD-10-CM | POA: Diagnosis present

## 2022-01-10 DIAGNOSIS — I152 Hypertension secondary to endocrine disorders: Secondary | ICD-10-CM | POA: Diagnosis present

## 2022-01-10 LAB — CBC WITH DIFFERENTIAL/PLATELET
Abs Immature Granulocytes: 0.02 10*3/uL (ref 0.00–0.07)
Basophils Absolute: 0 10*3/uL (ref 0.0–0.1)
Basophils Relative: 1 %
Eosinophils Absolute: 0 10*3/uL (ref 0.0–0.5)
Eosinophils Relative: 0 %
HCT: 40.8 % (ref 39.0–52.0)
Hemoglobin: 13.1 g/dL (ref 13.0–17.0)
Immature Granulocytes: 0 %
Lymphocytes Relative: 17 %
Lymphs Abs: 1.3 10*3/uL (ref 0.7–4.0)
MCH: 27.6 pg (ref 26.0–34.0)
MCHC: 32.1 g/dL (ref 30.0–36.0)
MCV: 85.9 fL (ref 80.0–100.0)
Monocytes Absolute: 0.4 10*3/uL (ref 0.1–1.0)
Monocytes Relative: 4 %
Neutro Abs: 6.2 10*3/uL (ref 1.7–7.7)
Neutrophils Relative %: 78 %
Platelets: 389 10*3/uL (ref 150–400)
RBC: 4.75 MIL/uL (ref 4.22–5.81)
RDW: 14.4 % (ref 11.5–15.5)
WBC: 7.9 10*3/uL (ref 4.0–10.5)
nRBC: 0 % (ref 0.0–0.2)

## 2022-01-10 LAB — COMPREHENSIVE METABOLIC PANEL
ALT: 10 U/L (ref 0–44)
AST: 16 U/L (ref 15–41)
Albumin: 4.2 g/dL (ref 3.5–5.0)
Alkaline Phosphatase: 45 U/L (ref 38–126)
Anion gap: 12 (ref 5–15)
BUN: 9 mg/dL (ref 6–20)
CO2: 21 mmol/L — ABNORMAL LOW (ref 22–32)
Calcium: 9.6 mg/dL (ref 8.9–10.3)
Chloride: 105 mmol/L (ref 98–111)
Creatinine, Ser: 0.8 mg/dL (ref 0.61–1.24)
GFR, Estimated: >60 mL/min (ref >60)
Glucose, Bld: 161 mg/dL — ABNORMAL HIGH (ref 70–99)
Potassium: 3.5 mmol/L (ref 3.5–5.1)
Sodium: 138 mmol/L (ref 135–145)
Total Bilirubin: 0.9 mg/dL (ref 0.3–1.2)
Total Protein: 7.7 g/dL (ref 6.5–8.1)

## 2022-01-10 LAB — URINALYSIS, ROUTINE W REFLEX MICROSCOPIC
Bacteria, UA: NONE SEEN
Bilirubin Urine: NEGATIVE
Glucose, UA: 50 mg/dL — AB
Hgb urine dipstick: NEGATIVE
Ketones, ur: 20 mg/dL — AB
Leukocytes,Ua: NEGATIVE
Nitrite: NEGATIVE
Protein, ur: 30 mg/dL — AB
Specific Gravity, Urine: 1.015 (ref 1.005–1.030)
pH: 7 (ref 5.0–8.0)

## 2022-01-10 LAB — BRAIN NATRIURETIC PEPTIDE: B Natriuretic Peptide: 57.9 pg/mL (ref 0.0–100.0)

## 2022-01-10 LAB — LIPASE, BLOOD: Lipase: 30 U/L (ref 11–51)

## 2022-01-10 LAB — CBG MONITORING, ED: Glucose-Capillary: 142 mg/dL — ABNORMAL HIGH (ref 70–99)

## 2022-01-10 LAB — TROPONIN I (HIGH SENSITIVITY)
Troponin I (High Sensitivity): 5 ng/L (ref <18)
Troponin I (High Sensitivity): 8 ng/L (ref <18)

## 2022-01-10 MED ORDER — ONDANSETRON HCL 4 MG PO TABS: 4.0000 mg | ORAL_TABLET | Freq: Four times a day (QID) | ORAL | Status: DC | PRN

## 2022-01-10 MED ORDER — ONDANSETRON HCL 4 MG/2ML IJ SOLN
4.0000 mg | Freq: Once | INTRAMUSCULAR | Status: AC
  Administered 2022-01-10: 4 mg via INTRAVENOUS
  Filled 2022-01-10: qty 2

## 2022-01-10 MED ORDER — EMPAGLIFLOZIN 10 MG PO TABS
10.0000 mg | ORAL_TABLET | Freq: Every day | ORAL | Status: DC
  Administered 2022-01-11 – 2022-01-12 (×2): 10 mg via ORAL
  Filled 2022-01-10 (×2): qty 1

## 2022-01-10 MED ORDER — METOCLOPRAMIDE HCL 5 MG/ML IJ SOLN
10.0000 mg | Freq: Once | INTRAMUSCULAR | Status: AC
  Administered 2022-01-10: 10 mg via INTRAVENOUS
  Filled 2022-01-10: qty 2

## 2022-01-10 MED ORDER — ONDANSETRON 4 MG PO TBDP: 4.0000 mg | ORAL_TABLET | Freq: Once | ORAL | Status: DC

## 2022-01-10 MED ORDER — INSULIN ASPART 100 UNIT/ML IJ SOLN
0.0000 [IU] | Freq: Three times a day (TID) | INTRAMUSCULAR | Status: DC
  Administered 2022-01-11: 3 [IU] via SUBCUTANEOUS
  Administered 2022-01-11: 2 [IU] via SUBCUTANEOUS
  Administered 2022-01-12: 3 [IU] via SUBCUTANEOUS

## 2022-01-10 MED ORDER — HYDRALAZINE HCL 20 MG/ML IJ SOLN: 10.0000 mg | Freq: Four times a day (QID) | INTRAMUSCULAR | Status: DC | PRN

## 2022-01-10 MED ORDER — SPIRONOLACTONE 12.5 MG HALF TABLET
12.5000 mg | ORAL_TABLET | Freq: Every day | ORAL | Status: DC
  Filled 2022-01-10: qty 1

## 2022-01-10 MED ORDER — ENOXAPARIN SODIUM 40 MG/0.4ML IJ SOSY
40.0000 mg | PREFILLED_SYRINGE | INTRAMUSCULAR | Status: DC
  Administered 2022-01-11: 40 mg via SUBCUTANEOUS
  Filled 2022-01-10: qty 0.4

## 2022-01-10 MED ORDER — MORPHINE SULFATE (PF) 2 MG/ML IV SOLN
2.0000 mg | INTRAVENOUS | Status: DC | PRN
  Administered 2022-01-11 (×2): 2 mg via INTRAVENOUS
  Filled 2022-01-10 (×2): qty 1

## 2022-01-10 MED ORDER — LOSARTAN POTASSIUM 50 MG PO TABS
25.0000 mg | ORAL_TABLET | Freq: Once | ORAL | Status: AC
  Administered 2022-01-10: 25 mg via ORAL
  Filled 2022-01-10: qty 1

## 2022-01-10 MED ORDER — LOSARTAN POTASSIUM 25 MG PO TABS
12.5000 mg | ORAL_TABLET | Freq: Every day | ORAL | Status: DC
  Filled 2022-01-10: qty 0.5

## 2022-01-10 MED ORDER — INSULIN ASPART 100 UNIT/ML IJ SOLN: 0.0000 [IU] | Freq: Every day | INTRAMUSCULAR | Status: DC

## 2022-01-10 MED ORDER — NITROGLYCERIN IN D5W 200-5 MCG/ML-% IV SOLN
0.0000 ug/min | INTRAVENOUS | Status: DC
  Administered 2022-01-10: 5 ug/min via INTRAVENOUS
  Filled 2022-01-10 (×2): qty 250

## 2022-01-10 MED ORDER — LABETALOL HCL 5 MG/ML IV SOLN
10.0000 mg | Freq: Once | INTRAVENOUS | Status: AC
  Administered 2022-01-10: 10 mg via INTRAVENOUS
  Filled 2022-01-10: qty 4

## 2022-01-10 MED ORDER — HYDRALAZINE HCL 20 MG/ML IJ SOLN
10.0000 mg | Freq: Once | INTRAMUSCULAR | Status: AC
  Administered 2022-01-10: 10 mg via INTRAVENOUS
  Filled 2022-01-10: qty 1

## 2022-01-10 MED ORDER — ONDANSETRON HCL 4 MG/2ML IJ SOLN
INTRAMUSCULAR | Status: AC
  Filled 2022-01-10: qty 2

## 2022-01-10 MED ORDER — DIPHENHYDRAMINE HCL 50 MG/ML IJ SOLN
12.5000 mg | Freq: Once | INTRAMUSCULAR | Status: AC
  Administered 2022-01-10: 12.5 mg via INTRAVENOUS
  Filled 2022-01-10: qty 1

## 2022-01-10 MED ORDER — ONDANSETRON HCL 4 MG/2ML IJ SOLN: 4.0000 mg | Freq: Four times a day (QID) | INTRAMUSCULAR | Status: DC | PRN

## 2022-01-10 NOTE — ED Provider Triage Note (Signed)
Emergency Medicine Provider Triage Evaluation Note  David [name] , a 41 y.o. male  was evaluated in triage.  Pt complains of nausea, vomiting, chest pain and shortness of breath.  He also has been having elevated blood pressure.  Denies any specific abdominal pain..  Review of Systems  Per HPI  Physical Exam  Ht 5\' 7"  (1.702 m)   Wt 62.1 kg   BMI 21.46 kg/m  Gen:   Awake, no distress   Resp:  Normal effort  MSK:   Moves extremities without difficulty  Other:  Abdomen nontender.  Upper extremity pulses symmetric.  Regular rhythm.  Medical Decision Making  Medically screening exam initiated at 12:16 PM.  Appropriate orders placed.  David [name] was informed that the remainder of the evaluation will be completed by another provider, this initial triage assessment does not replace that evaluation, and the importance of remaining in the ED until their evaluation is complete.     Sherrill Raring, PA-C 01/10/22 1217

## 2022-01-10 NOTE — ED Notes (Signed)
Patients IV is not working at this time.

## 2022-01-10 NOTE — ED Provider Notes (Incomplete)
[facility] Provider Note   CSN: [id number] Arrival date & time: 01/10/22  1145     History {Add pertinent medical, surgical, social history, OB history to HPI:1} Chief Complaint  Patient presents with   Emesis   Hypertension    David [name] is a 41 y.o. male.  HPI     41 year old male with a history of LV thrombus, GI bleed, gastritis, cocaine abuse, diabetes, hypertension, QTc prolongation, systolic/diastolic congestive heart failure with an echo showing an EF of 25 to 30% in March 2023 found to have nonischemic cardiomyopathy,  Began vomiting today, can't keep anything down  Blood pressure high  A little bit of CP, no dyspnea No abdominal pain No headache, did have one but it left Weights staying the same No leg swelling No dyspnea with laying down flat Denies numbness, weakness, difficulty talking or walking, visual changes or facial droop.   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 Authorizing Provider  acetaminophen (TYLENOL) 500 MG tablet Take 500 mg by mouth every 6 (six) hours as needed for moderate pain. Patient not taking: Reported on 11/15/2021    [provider]  blood glucose meter kit and supplies KIT Dispense based on patient and insurance preference. Use up to four times daily as directed. Patient not taking: Reported on 11/15/2021 05/17/21   Kayleen Memos, DO  empagliflozin (JARDIANCE) 10 MG TABS  tablet Take 1 tablet (10 mg total) by mouth daily before breakfast. 10/31/21   Lyda Jester M, PA-C  insulin aspart (NOVOLOG) 100 UNIT/ML injection For blood sugars 0-150 give 0 units of insulin, 200-250 give 4 units of insulin, 251-300 give 6 units, 301-350 give 8 units, 351- 400 give 10 units, > 400 give 12 units and call M.D. Discussed hypoglycemia protocol. Patient not taking: Reported on 10/31/2021 01/13/21   Kerin Perna, NP  Insulin Pen Needle (PEN NEEDLES 31GX5/16") 31G X 8 MM MISC As directed Patient not taking: Reported on 10/31/2021 08/16/20   Kathie Dike, MD  losartan (COZAAR) 25 MG tablet Take 0.5 tablets (12.5 mg total) by mouth daily. 10/31/21 05/29/22  Lyda Jester M, PA-C  ondansetron (ZOFRAN) 4 MG tablet Take 1 tablet (4 mg total) by mouth every 8 (eight) hours as needed for nausea or vomiting. Patient not taking: Reported on 10/31/2021 06/21/21   Tegeler, Gwenyth Allegra, MD  ondansetron (ZOFRAN-ODT) 4 MG disintegrating tablet Take 1 tablet (4 mg total) by mouth every 8 (eight) hours as needed for nausea or vomiting. Patient not taking: Reported on 10/31/2021 07/26/21   Rayna Sexton, PA-C  pantoprazole (PROTONIX) 40 MG tablet Take 1 tablet (40 mg total) by mouth daily. Patient not taking: Reported on 10/31/2021 05/17/21 07/07/22  Kayleen Memos, DO  polyethylene glycol (MIRALAX / GLYCOLAX) 17 g packet Take 17 g by mouth daily as needed for mild constipation. Patient  not taking: Reported on 10/31/2021 05/17/21   Kayleen Memos, DO  spironolactone (ALDACTONE) 25 MG tablet Take 0.5 tablets (12.5 mg total) by mouth daily. 10/31/21   Consuelo Pandy, PA-C      Allergies    Patient has no known allergies.    Review of Systems   Review of Systems  Physical Exam Updated Vital Signs BP (!) 230/139   Pulse 75   Temp 97.6 F (36.4 C) (Oral)   Resp 13   Ht _0  (1.702 m)   Wt 62.1 kg   SpO2 100%   BMI 21.46 kg/m  Physical Exam  ED Results / Procedures /  Treatments   Labs (all labs ordered are listed, but only abnormal results are displayed) Labs Reviewed  COMPREHENSIVE METABOLIC PANEL - Abnormal; Notable for the following components:      Result Value   CO2 21 (*)    Glucose, Bld 161 (*)    All other components within normal limits  CBC WITH DIFFERENTIAL/PLATELET  LIPASE, BLOOD  BRAIN NATRIURETIC PEPTIDE  URINALYSIS, ROUTINE W REFLEX MICROSCOPIC  TROPONIN I (HIGH SENSITIVITY)  TROPONIN I (HIGH SENSITIVITY)    EKG None  Radiology DG Chest 2 View  Result Date: 01/10/2022 CLINICAL DATA:  Chest pain EXAM: CHEST - 2 VIEW COMPARISON:  Chest 07/19/2021 FINDINGS: The heart size and mediastinal contours are within normal limits. Both lungs are clear. The visualized skeletal structures are unremarkable. IMPRESSION: No active cardiopulmonary disease. Electronically Signed   By: Franchot Gallo M.D.   On: 01/10/2022 13:19    Procedures Procedures  {Document cardiac monitor, telemetry assessment procedure when appropriate:1}  Medications Ordered in ED Medications  labetalol (NORMODYNE) injection 10 mg (has no administration in time range)  ondansetron (ZOFRAN) injection 4 mg (4 mg Intravenous Given 01/10/22 1900)    ED Course/ Medical Decision Making/ A&P                           Medical Decision Making Risk Prescription drug management.   ***  {Document critical care time when appropriate:1} {Document review of labs and clinical decision tools ie heart score, Chads2Vasc2 etc:1}  {Document your independent review of radiology images, and any outside records:1} {Document your discussion with family members, caretakers, and with consultants:1} {Document social determinants of health affecting pt's care:1} {Document your decision making why or why not admission, treatments were needed:1} Final Clinical Impression(s) / ED Diagnoses Final diagnoses:  None    Rx / DC Orders ED Discharge Orders     None

## 2022-01-10 NOTE — ED Triage Notes (Signed)
Seen at CHF clinic for n/v and hypertension.  Patient is no compliant with meds.  Started back on them 2-3 days ago and when he starts his meds back he gets n/v.  BP 222/130 HR 60  EKG LVH.  CBG 148 20 RH  received 4mg  zofran.

## 2022-01-10 NOTE — Progress Notes (Signed)
Paramedicine Encounter    Patient ID: David [name], male    DOB: [birth year], 42 y.o.   MRN: [id number]  Pt is having bad day today-he is sick with n/v laying on couch when I arrived.  Pt wife is home there and said up until a few days ago is when he restarted taking his medicines-the spiro, losartan and jardiance. She reports he was doing good for about 2-3 wks and then when he starts back taking meds, after a few days he gets n/v.  He denies pain at this time.  Last time he ate was last night. He did vomit a few times with me there. Looks like bile-non-food substance.  B/p extremely high.  Denies alcohol or recent drug use.  Wife wants him to be evaluated at ER. She reports this happened suddenly this morning-like his usual episodes when he gets like this.  He reports this feels the same as it always does. He was seen by PCP recently--didn't sound like it was discussed about his GI issues.  Doesn't sound like a referral to GI was made??? EMS transport unit was requested. IV started. EMS unit arrived.  He agreed to go to ER.  Zofran was given, no changes on 12 lead.  Will f/u once he is released.   Also got results from sleep study and wife sent in insurance info.   BP (!) 220/100   Pulse 82   Resp 18   SpO2 97%  Weight yesterday-? Last visit weight-?  Patient Care Team: Kerin Perna, NP as PCP - General (Internal Medicine) Buford Dresser, MD as PCP - Cardiology (Cardiology)  Patient Active Problem List   Diagnosis Date Noted   Syncope 07/19/2021   HFrEF (heart failure with reduced ejection fraction) ([place]) 93/26/7124   Acute systolic heart failure ([place])    Hypokalemia 05/13/2021   Hypomagnesemia 05/13/2021   Hypocalcemia 05/13/2021   Gastritis 09/22/2020   Upper GI bleeding 09/21/2020   Nicotine dependence, cigarettes, uncomplicated 58/11/9831   Acute epigastric pain 09/21/2020   Polysubstance abuse ([place]) 08/16/2020   Near syncope 08/15/2020   AKI (acute  kidney injury) ([place]) 08/15/2020   Opioid dependence with withdrawal ([place]) 08/15/2020   Alcohol withdrawal ([place]) 04/05/2016   Hypertension associated with diabetes ([place]) 04/05/2016   Type 2 diabetes mellitus without complication, with long-term current use of insulin ([place])    No current facility-administered medications for this visit.  Current Outpatient Medications:    empagliflozin (JARDIANCE) 10 MG TABS tablet, Take 1 tablet (10 mg total) by mouth daily before breakfast., Disp: 30 tablet, Rfl: 6   losartan (COZAAR) 25 MG tablet, Take 0.5 tablets (12.5 mg total) by mouth daily., Disp: 15 tablet, Rfl: 6   spironolactone (ALDACTONE) 25 MG tablet, Take 0.5 tablets (12.5 mg total) by mouth daily., Disp: 15 tablet, Rfl: 6   acetaminophen (TYLENOL) 500 MG tablet, Take 500 mg by mouth every 6 (six) hours as needed for moderate pain. (Patient not taking: Reported on 11/15/2021), Disp: , Rfl:    blood glucose meter kit and supplies KIT, Dispense based on patient and insurance preference. Use up to four times daily as directed. (Patient not taking: Reported on 11/15/2021), Disp: 1 each, Rfl: 0   insulin aspart (NOVOLOG) 100 UNIT/ML injection, For blood sugars 0-150 give 0 units of insulin, 200-250 give 4 units of insulin, 251-300 give 6 units, 301-350 give 8 units, 351- 400 give 10 units, > 400 give 12 units and call M.D. Discussed hypoglycemia protocol. (Patient  not taking: Reported on 10/31/2021), Disp: 10 mL, Rfl: PRN   Insulin Pen Needle (PEN NEEDLES 31GX5/16") 31G X 8 MM MISC, As directed (Patient not taking: Reported on 10/31/2021), Disp: 100 each, Rfl: 1   ondansetron (ZOFRAN) 4 MG tablet, Take 1 tablet (4 mg total) by mouth every 8 (eight) hours as needed for nausea or vomiting. (Patient not taking: Reported on 10/31/2021), Disp: 20 tablet, Rfl: 0   ondansetron (ZOFRAN-ODT) 4 MG disintegrating tablet, Take 1 tablet (4 mg total) by mouth every 8 (eight) hours as needed for nausea or vomiting. (Patient not  taking: Reported on 10/31/2021), Disp: 8 tablet, Rfl: 0   pantoprazole (PROTONIX) 40 MG tablet, Take 1 tablet (40 mg total) by mouth daily. (Patient not taking: Reported on 10/31/2021), Disp: 30 tablet, Rfl: 0   polyethylene glycol (MIRALAX / GLYCOLAX) 17 g packet, Take 17 g by mouth daily as needed for mild constipation. (Patient not taking: Reported on 10/31/2021), Disp: 14 each, Rfl: 0  Facility-Administered Medications Ordered in Other Visits:    ondansetron (ZOFRAN) injection 4 mg, 4 mg, Intravenous, Once, Sherrill Raring, PA-C No Known Allergies    Social History   Socioeconomic History   Marital status: Married    Spouse name: Not on file   Number of children: Not on file   Years of education: Not on file   Highest education level: Not on file  Occupational History   Not on file  Tobacco Use   Smoking status: Every Day    Packs/day: 1.00    Years: 10.00    Total pack years: 10.00    Types: Cigarettes   Smokeless tobacco: Never  Vaping Use   Vaping Use: Never used  Substance and Sexual Activity   Alcohol use: Yes    Alcohol/week: 30.0 standard drinks of alcohol    Types: 30 Shots of liquor per week    Comment: minimum 4 shots of liquor daily   Drug use: Yes    Types: Oxycodone, Marijuana   Sexual activity: Yes    Birth control/protection: Condom  Other Topics Concern   Not on file  Social History Narrative   Not on file   Social Determinants of Health   Financial Resource Strain: High Risk (10/31/2021)   Overall Financial Resource Strain (CARDIA)    Difficulty of Paying Living Expenses: Hard  Food Insecurity: No Food Insecurity (10/31/2021)   Hunger Vital Sign    Worried About Running Out of Food in the Last Year: Never true    Ran Out of Food in the Last Year: Never true  Transportation Needs: No Transportation Needs (10/31/2021)   PRAPARE - Hydrologist (Medical): No    Lack of Transportation (Non-Medical): No  Physical Activity: Not  on file  Stress: Not on file  Social Connections: Not on file  Intimate Partner Violence: Not on file    Physical Exam      No future appointments.     Marylouise Stacks, [facility] Paramedic  01/10/22

## 2022-01-10 NOTE — H&P (Signed)
History and Physical    Patient: David [name] VCB:[id number] DOB: [birth year] DOA: 01/10/2022 DOS: the patient was seen and examined on 01/10/2022 PCP: Kerin Perna, NP  Patient coming from: {Point_of_Origin:[address]}  Chief Complaint:  Chief Complaint  Patient presents with   Emesis   Hypertension   HPI: David [name] is a 41 y.o. 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 He has a 10.00 pack-year smoking history. He has never used smokeless tobacco. He reports current alcohol use of about 30.0 standard drinks of alcohol per week. He reports current drug use. Drugs: Oxycodone and Marijuana.  No Known Allergies  Family History  Problem Relation Age of Onset   Diabetes Father     Prior to Admission medications   Medication Sig Start Date End Date Taking? Authorizing Provider  acetaminophen (TYLENOL) 500 MG tablet Take 500 mg by mouth every 6 (six) hours as needed for moderate pain. Patient not taking: Reported on 11/15/2021    [provider]  blood glucose meter kit and supplies KIT Dispense based on patient and insurance preference. Use up to  four times daily as directed. Patient not taking: Reported on 11/15/2021 05/17/21   Kayleen Memos, DO  empagliflozin (JARDIANCE) 10 MG TABS tablet Take 1 tablet (10 mg total) by mouth daily before breakfast. 10/31/21   Lyda Jester M, PA-C  insulin aspart (NOVOLOG) 100 UNIT/ML injection For blood sugars 0-150 give 0 units of insulin, 200-250 give 4 units of insulin, 251-300 give 6 units, 301-350 give 8 units, 351- 400 give 10 units, > 400 give 12 units and call M.D. Discussed hypoglycemia protocol. Patient not taking: Reported on 10/31/2021 01/13/21   Kerin Perna, NP  Insulin Pen Needle (PEN NEEDLES 31GX5/16") 31G X 8 MM MISC As directed Patient not taking: Reported on 10/31/2021 08/16/20   Kathie Dike, MD  losartan (COZAAR) 25 MG tablet Take 0.5 tablets (12.5 mg total) by mouth daily. 10/31/21 05/29/22  Lyda Jester M, PA-C  ondansetron (ZOFRAN) 4 MG tablet Take 1 tablet (4 mg total) by mouth every 8 (eight) hours as needed for nausea or vomiting. Patient not taking: Reported on 10/31/2021 06/21/21   Tegeler, Gwenyth Allegra, MD  ondansetron (ZOFRAN-ODT) 4 MG disintegrating tablet Take 1 tablet (4 mg total) by mouth every 8 (eight) hours as needed for nausea or vomiting. Patient not taking: Reported on 10/31/2021 07/26/21   Rayna Sexton, PA-C  pantoprazole (PROTONIX) 40 MG tablet Take 1 tablet (40 mg total) by mouth daily. Patient not taking: Reported on 10/31/2021 05/17/21 07/07/22  Kayleen Memos, DO  polyethylene glycol (MIRALAX / GLYCOLAX) 17 g packet Take 17 g by mouth daily as  needed for mild constipation. Patient not taking: Reported on 10/31/2021 05/17/21   Kayleen Memos, DO  spironolactone (ALDACTONE) 25 MG tablet Take 0.5 tablets (12.5 mg total) by mouth daily. 10/31/21   Consuelo Pandy, PA-C    Physical Exam: Vitals:   01/10/22 2100 01/10/22 2103 01/10/22 2115 01/10/22 2123  BP: (!) 197/123  (!) 201/121   Pulse:  92 98 (!) 101  Resp:   16   Temp:      TempSrc:       SpO2:  100% 100% 100%  Weight:      Height:       *** Data Reviewed: {Tip this will not be part of the note when signed- Document your independent interpretation of telemetry tracing, EKG, lab, Radiology test or any other diagnostic tests. Add any new diagnostic test ordered today. (Optional):26781} {Results:26384}  Assessment and Plan: No notes have been filed under this hospital service. Service: Hospitalist     Advance Care Planning:   Code Status: Prior ***  Consults: ***  Family Communication: ***  Severity of Illness: {Observation/Inpatient:21159}  AuthorBarbette Merino, MD 01/10/2022 9:35 PM  For on call review [email or web address].

## 2022-01-10 NOTE — ED Notes (Signed)
The wife is request an update with patient permission   debra  [phone]

## 2022-01-11 DIAGNOSIS — I5020 Unspecified systolic (congestive) heart failure: Secondary | ICD-10-CM | POA: Diagnosis not present

## 2022-01-11 DIAGNOSIS — E119 Type 2 diabetes mellitus without complications: Secondary | ICD-10-CM | POA: Diagnosis not present

## 2022-01-11 DIAGNOSIS — F17210 Nicotine dependence, cigarettes, uncomplicated: Secondary | ICD-10-CM

## 2022-01-11 DIAGNOSIS — I160 Hypertensive urgency: Secondary | ICD-10-CM | POA: Diagnosis not present

## 2022-01-11 DIAGNOSIS — Z794 Long term (current) use of insulin: Secondary | ICD-10-CM

## 2022-01-11 LAB — RAPID URINE DRUG SCREEN, HOSP PERFORMED
Amphetamines: NOT DETECTED
Barbiturates: NOT DETECTED
Benzodiazepines: NOT DETECTED
Cocaine: POSITIVE — AB
Opiates: POSITIVE — AB
Tetrahydrocannabinol: NOT DETECTED

## 2022-01-11 LAB — CBC
HCT: 38.8 % — ABNORMAL LOW (ref 39.0–52.0)
Hemoglobin: 13.7 g/dL (ref 13.0–17.0)
MCH: 28.7 pg (ref 26.0–34.0)
MCHC: 35.3 g/dL (ref 30.0–36.0)
MCV: 81.3 fL (ref 80.0–100.0)
Platelets: 445 10*3/uL — ABNORMAL HIGH (ref 150–400)
RBC: 4.77 MIL/uL (ref 4.22–5.81)
RDW: 14 % (ref 11.5–15.5)
WBC: 8.3 10*3/uL (ref 4.0–10.5)
nRBC: 0 % (ref 0.0–0.2)

## 2022-01-11 LAB — COMPREHENSIVE METABOLIC PANEL
ALT: 14 U/L (ref 0–44)
AST: 22 U/L (ref 15–41)
Albumin: 4.4 g/dL (ref 3.5–5.0)
Alkaline Phosphatase: 42 U/L (ref 38–126)
Anion gap: 13 (ref 5–15)
BUN: 13 mg/dL (ref 6–20)
CO2: 23 mmol/L (ref 22–32)
Calcium: 9.8 mg/dL (ref 8.9–10.3)
Chloride: 99 mmol/L (ref 98–111)
Creatinine, Ser: 1.03 mg/dL (ref 0.61–1.24)
GFR, Estimated: >60 mL/min (ref >60)
Glucose, Bld: 151 mg/dL — ABNORMAL HIGH (ref 70–99)
Potassium: 3.6 mmol/L (ref 3.5–5.1)
Sodium: 135 mmol/L (ref 135–145)
Total Bilirubin: 1.3 mg/dL — ABNORMAL HIGH (ref 0.3–1.2)
Total Protein: 7.9 g/dL (ref 6.5–8.1)

## 2022-01-11 LAB — HIV ANTIBODY (ROUTINE TESTING W REFLEX): HIV Screen 4th Generation wRfx: NONREACTIVE

## 2022-01-11 LAB — CBG MONITORING, ED
Glucose-Capillary: 195 mg/dL — ABNORMAL HIGH (ref 70–99)
Glucose-Capillary: 146 mg/dL — ABNORMAL HIGH (ref 70–99)

## 2022-01-11 LAB — HEMOGLOBIN A1C
Hgb A1c MFr Bld: 6.1 % — ABNORMAL HIGH (ref 4.8–5.6)
Mean Plasma Glucose: 128.37 mg/dL

## 2022-01-11 LAB — GLUCOSE, CAPILLARY
Glucose-Capillary: 111 mg/dL — ABNORMAL HIGH (ref 70–99)
Glucose-Capillary: 139 mg/dL — ABNORMAL HIGH (ref 70–99)

## 2022-01-11 MED ORDER — LOSARTAN POTASSIUM 50 MG PO TABS
50.0000 mg | ORAL_TABLET | Freq: Every day | ORAL | Status: DC
  Administered 2022-01-11 – 2022-01-12 (×2): 50 mg via ORAL
  Filled 2022-01-11 (×2): qty 1

## 2022-01-11 MED ORDER — HYDRALAZINE HCL 20 MG/ML IJ SOLN: 10.0000 mg | Freq: Four times a day (QID) | INTRAMUSCULAR | Status: DC | PRN

## 2022-01-11 MED ORDER — SPIRONOLACTONE 25 MG PO TABS
25.0000 mg | ORAL_TABLET | Freq: Every day | ORAL | Status: DC
  Administered 2022-01-11 – 2022-01-12 (×2): 25 mg via ORAL
  Filled 2022-01-11 (×2): qty 1

## 2022-01-11 MED ORDER — AMLODIPINE BESYLATE 5 MG PO TABS
5.0000 mg | ORAL_TABLET | Freq: Every day | ORAL | Status: DC
  Administered 2022-01-11 – 2022-01-12 (×2): 5 mg via ORAL
  Filled 2022-01-11 (×2): qty 1

## 2022-01-11 NOTE — Progress Notes (Signed)
PROGRESS NOTE        PATIENT DETAILS Name: David [name] Age: 41 y.o. Sex: male Date of Birth: July 02, 1980 Admit Date: 01/10/2022 Admitting Physician Rometta Emery, MD [id number], Kinnie Scales, NP  Brief Summary: Patient is a 41 y.o.  male with history of chronic HFrEF, cocaine use, HTN, DM-2-who presented with headache, nausea, vomiting-found to have hypertensive emergency and subsequently started on IV nitroglycerin infusion and admitted to [facility] service.  Significant events: 11/7>> admit to [facility]-hypertensive emergency  Significant studies: 5/3>> Echo: EF 40-45% 11/7>> CXR: No PNA 11/8>> UDS: Positive for cocaine/opiates.  Significant microbiology data: None  Procedures: None  Consults: None  Subjective: Headache/nausea/vomiting resolved.  Lying comfortably in bed.  Claims that he sells cocaine-and that it must have gotten into his system through his skin pores.  Objective: Vitals: Blood pressure (!) 170/126, pulse (!) 105, temperature 98.4 F (36.9 C), temperature source Oral, resp. rate 17, height 5\' 7"  (1.702 m), weight 62.1 kg, SpO2 100 %.   Exam: Gen Exam:Alert awake-not in any distress HEENT:atraumatic, normocephalic Chest: B/L clear to auscultation anteriorly CVS:S1S2 regular Abdomen:soft non tender, non distended Extremities:no edema Neurology: Non focal Skin: no rash  Pertinent Labs/Radiology:    Latest Ref Rng & Units 01/11/2022   12:30 AM 01/10/2022   12:17 PM 07/26/2021   12:38 PM  CBC  WBC 4.0 - 10.5 K/uL 8.3  7.9  11.2   Hemoglobin 13.0 - 17.0 g/dL 07/28/2021  21.1  94.1   Hematocrit 39.0 - 52.0 % 38.8  40.8  42.6   Platelets 150 - 400 K/uL 445  389  340     Lab Results  Component Value Date   NA 135 01/11/2022   K 3.6 01/11/2022   CL 99 01/11/2022   CO2 23 01/11/2022      Assessment/Plan: Hypertensive emergency BP improving-but still on the higher side-added amlodipine-increased losartan/Aldactone  dosage-discussed with RN-attempt to titrate down nitroglycerin infusion.  Avoid beta-blocker given active cocaine use. Per patient-he is apparently compliant with medications but per ED documentation-patient apparently has been noncompliant and only started taking medications a few days back. Counseled extensively regarding importance of stopping cocaine use.  Chronic HFrEF Euvolemic Continue losartan/Aldactone/Jardiance Avoid Coreg for now given active cocaine use.  History of LV thrombus Review of prior outpatient cardiology note-no longer on Eliquis  DM-2 (A1c 6.1 on 11/8) CBG stable-continue SSI/Jardiance  Recent Labs    01/10/22 2258 01/11/22 0713  GLUCAP 142* 195*    Medication noncompliance:  Counseled  Cocaine use: Counseled  Tobacco use Continue transdermal nicotine  BMI: Estimated body mass index is 21.46 kg/m as calculated from the following:   Height as of this encounter: 5\' 7"  (1.702 m).   Weight as of this encounter: 62.1 kg.   Code status:   Code Status: Full Code   DVT Prophylaxis: enoxaparin (LOVENOX) injection 40 mg Start: 01/11/22 0800   Family Communication: None at bedside   Disposition Plan: Status is: Inpatient Remains inpatient appropriate because: Uncontrolled hypertension-requiring nitroglycerin infusion-plans are to titrate down nitroglycerin as oral antihypertensives being added/titrated.   Planned Discharge Destination:Home hopefully in the next 1-2 days.   Diet: Diet Order             Diet heart healthy/carb modified Room service appropriate? Yes; Fluid consistency: Thin  Diet effective now  Antimicrobial agents: Anti-infectives (From admission, onward)    None        MEDICATIONS: Scheduled Meds:  amLODipine  5 mg Oral Daily   empagliflozin  10 mg Oral QAC breakfast   enoxaparin (LOVENOX) injection  40 mg Subcutaneous Q24H   insulin aspart  0-15 Units Subcutaneous TID WC   insulin aspart   0-5 Units Subcutaneous QHS   losartan  50 mg Oral Daily   spironolactone  25 mg Oral Daily   Continuous Infusions:  nitroGLYCERIN 65 mcg/min (01/11/22 0635)   PRN Meds:.hydrALAZINE, morphine injection, ondansetron **OR** ondansetron (ZOFRAN) IV   I have personally reviewed following labs and imaging studies  LABORATORY DATA: CBC: Recent Labs  Lab 01/10/22 1217 01/11/22 0030  WBC 7.9 8.3  NEUTROABS 6.2  --   HGB 13.1 13.7  HCT 40.8 38.8*  MCV 85.9 81.3  PLT 389 445*    Basic Metabolic Panel: Recent Labs  Lab 01/10/22 1217 01/11/22 0030  NA 138 135  K 3.5 3.6  CL 105 99  CO2 21* 23  GLUCOSE 161* 151*  BUN 9 13  CREATININE 0.80 1.03  CALCIUM 9.6 9.8    GFR: Estimated Creatinine Clearance: 84.6 mL/min (by C-G formula based on SCr of 1.03 mg/dL).  Liver Function Tests: Recent Labs  Lab 01/10/22 1217 01/11/22 0030  AST 16 22  ALT 10 14  ALKPHOS 45 42  BILITOT 0.9 1.3*  PROT 7.7 7.9  ALBUMIN 4.2 4.4   Recent Labs  Lab 01/10/22 1217  LIPASE 30   No results for input(s): "AMMONIA" in the last 168 hours.  Coagulation Profile: No results for input(s): "INR", "PROTIME" in the last 168 hours.  Cardiac Enzymes: No results for input(s): "CKTOTAL", "CKMB", "CKMBINDEX", "TROPONINI" in the last 168 hours.  BNP (last 3 results) No results for input(s): "PROBNP" in the last 8760 hours.  Lipid Profile: No results for input(s): "CHOL", "HDL", "LDLCALC", "TRIG", "CHOLHDL", "LDLDIRECT" in the last 72 hours.  Thyroid Function Tests: No results for input(s): "TSH", "T4TOTAL", "FREET4", "T3FREE", "THYROIDAB" in the last 72 hours.  Anemia Panel: No results for input(s): "VITAMINB12", "FOLATE", "FERRITIN", "TIBC", "IRON", "RETICCTPCT" in the last 72 hours.  Urine analysis:    Component Value Date/Time   COLORURINE YELLOW 01/10/2022 2238   APPEARANCEUR CLEAR 01/10/2022 2238   LABSPEC 1.015 01/10/2022 2238   PHURINE 7.0 01/10/2022 2238   GLUCOSEU 50 (A)  01/10/2022 2238   HGBUR NEGATIVE 01/10/2022 2238   BILIRUBINUR NEGATIVE 01/10/2022 2238   BILIRUBINUR negative 07/28/2015 1438   BILIRUBINUR Small 06/15/2014 1841   KETONESUR 20 (A) 01/10/2022 2238   PROTEINUR 30 (A) 01/10/2022 2238   UROBILINOGEN 0.2 07/28/2015 1438   UROBILINOGEN 0.2 09/23/2014 1009   NITRITE NEGATIVE 01/10/2022 2238   LEUKOCYTESUR NEGATIVE 01/10/2022 2238    Sepsis Labs: Lactic Acid, Venous No results found for: "LATICACIDVEN"  MICROBIOLOGY: No results found for this or any previous visit (from the past 240 hour(s)).  RADIOLOGY STUDIES/RESULTS: DG Chest 2 View  Result Date: 01/10/2022 CLINICAL DATA:  Chest pain EXAM: CHEST - 2 VIEW COMPARISON:  Chest 07/19/2021 FINDINGS: The heart size and mediastinal contours are within normal limits. Both lungs are clear. The visualized skeletal structures are unremarkable. IMPRESSION: No active cardiopulmonary disease. Electronically Signed   By: Franchot Gallo M.D.   On: 01/10/2022 13:19     LOS: 1 day   Oren Binet, MD  [facility]    To contact the attending provider between 7A-7P or the covering provider  during after hours 7P-7A, please log into the web site [email or web address] and access using universal [place] password for that web site. If you do not have the password, please call the hospital operator.  01/11/2022, 8:56 AM

## 2022-01-11 NOTE — ED Notes (Signed)
Stopped nitro gtt per MD orders

## 2022-01-11 NOTE — ED Notes (Signed)
Pt ambulatory to bathroom

## 2022-01-12 ENCOUNTER — Other Ambulatory Visit (HOSPITAL_COMMUNITY): Payer: Self-pay

## 2022-01-12 DIAGNOSIS — I160 Hypertensive urgency: Secondary | ICD-10-CM | POA: Diagnosis not present

## 2022-01-12 DIAGNOSIS — F17210 Nicotine dependence, cigarettes, uncomplicated: Secondary | ICD-10-CM | POA: Diagnosis not present

## 2022-01-12 DIAGNOSIS — E119 Type 2 diabetes mellitus without complications: Secondary | ICD-10-CM | POA: Diagnosis not present

## 2022-01-12 DIAGNOSIS — I5020 Unspecified systolic (congestive) heart failure: Secondary | ICD-10-CM | POA: Diagnosis not present

## 2022-01-12 LAB — GLUCOSE, CAPILLARY
Glucose-Capillary: 175 mg/dL — ABNORMAL HIGH (ref 70–99)
Glucose-Capillary: 112 mg/dL — ABNORMAL HIGH (ref 70–99)

## 2022-01-12 MED ORDER — SPIRONOLACTONE 25 MG PO TABS
25.0000 mg | ORAL_TABLET | Freq: Every day | ORAL | 2 refills | Status: AC
  Filled 2022-01-12: qty 30, 30d supply, fill #0

## 2022-01-12 MED ORDER — EMPAGLIFLOZIN 10 MG PO TABS
10.0000 mg | ORAL_TABLET | Freq: Every day | ORAL | 6 refills | Status: DC
  Filled 2022-01-12: qty 30, 30d supply, fill #0

## 2022-01-12 MED ORDER — LOSARTAN POTASSIUM 25 MG PO TABS
50.0000 mg | ORAL_TABLET | Freq: Every day | ORAL | 2 refills | Status: AC
  Filled 2022-01-12: qty 60, 30d supply, fill #0

## 2022-01-12 MED ORDER — DAPAGLIFLOZIN PROPANEDIOL 10 MG PO TABS
10.0000 mg | ORAL_TABLET | Freq: Every day | ORAL | 6 refills | Status: DC
  Filled 2022-01-12: qty 30, 30d supply, fill #0

## 2022-01-12 MED ORDER — DAPAGLIFLOZIN PROPANEDIOL 10 MG PO TABS
10.0000 mg | ORAL_TABLET | Freq: Every day | ORAL | 6 refills | Status: AC
  Filled 2022-01-12: qty 30, 30d supply, fill #0

## 2022-01-12 MED ORDER — AMLODIPINE BESYLATE 5 MG PO TABS
5.0000 mg | ORAL_TABLET | Freq: Every day | ORAL | 2 refills | Status: DC
  Filled 2022-01-12: qty 30, 30d supply, fill #0

## 2022-01-12 NOTE — Progress Notes (Signed)
Nsg Discharge Note  Admit Date:  01/10/2022 Discharge date: 01/12/2022   Torren T Jent to be D/C'd Home per MD order.  AVS completed. Patient/caregiver able to verbalize understanding.  Discharge Medication: Allergies as of 01/12/2022   No Known Allergies      Medication List     STOP taking these medications    carvedilol 3.125 MG tablet Commonly known as: COREG   folic acid 1 MG tablet Commonly known as: FOLVITE   insulin aspart 100 UNIT/ML injection Commonly known as: novoLOG   Jardiance 10 MG Tabs tablet Generic drug: empagliflozin   ondansetron 4 MG disintegrating tablet Commonly known as: ZOFRAN-ODT   ondansetron 4 MG tablet Commonly known as: Zofran   pantoprazole 40 MG tablet Commonly known as: PROTONIX   PEN NEEDLES 31GX5/16" 31G X 8 MM Misc   polyethylene glycol 17 g packet Commonly known as: MIRALAX / GLYCOLAX       TAKE these medications    acetaminophen 500 MG tablet Commonly known as: TYLENOL Take 500 mg by mouth every 6 (six) hours as needed for moderate pain.   amLODipine 5 MG tablet Commonly known as: NORVASC Take 1 tablet (5 mg total) by mouth daily.   blood glucose meter kit and supplies Kit Dispense based on patient and insurance preference. Use up to four times daily as directed.   dapagliflozin propanediol 10 MG Tabs tablet Commonly known as: FARXIGA Take 1 tablet (10 mg total) by mouth daily.   diphenhydramine-acetaminophen 25-500 MG Tabs tablet Commonly known as: TYLENOL PM Take 1 tablet by mouth at bedtime as needed (sleep).   doxylamine (Sleep) 25 MG tablet Commonly known as: UNISOM Take 25 mg by mouth at bedtime as needed for sleep.   losartan 25 MG tablet Commonly known as: COZAAR Take 2 tablets (50 mg total) by mouth daily. What changed: how much to take   spironolactone 25 MG tablet Commonly known as: ALDACTONE Take 1 tablet (25 mg total) by mouth daily. What changed: how much to take        Discharge  Assessment: Vitals:   01/12/22 0500 01/12/22 0740  BP: (!) 150/110 (!) 140/113  Pulse:  (!) 103  Resp:  20  Temp:  98.3 F (36.8 C)  SpO2:  100%   Skin clean, dry and intact without evidence of skin break down, no evidence of skin tears noted. IV catheter discontinued intact. Site without signs and symptoms of complications - no redness or edema noted at insertion site, patient denies c/o pain - only slight tenderness at site.  Dressing with slight pressure applied.  D/c Instructions-Education: Discharge instructions given to patient/family with verbalized understanding. D/c education completed with patient/family including follow up instructions, medication list, d/c activities limitations if indicated, with other d/c instructions as indicated by MD - patient able to verbalize understanding, all questions fully answered. Patient instructed to return to ED, call 911, or call MD for any changes in condition.  Patient escorted via WC, and D/C home via private auto.   , RN 01/12/2022 9:31 AM 

## 2022-01-12 NOTE — Discharge Instructions (Signed)
  Recommendations for Outpatient Follow-up:  Follow up with PCP in 1-2 weeks Please obtain CMP/CBC in one week Continue counseling regarding compliance to medications Continue counseling regarding stopping further cocaine use

## 2022-01-12 NOTE — Discharge Summary (Signed)
PATIENT DETAILS Name: David [name] Age: 41 y.o. Sex: male Date of Birth: December 01, 1980 MRN: [id number]. Admitting Physician: Elwyn Reach, MD LOV:FIEPPIR, Milford Cage, NP  Admit Date: 01/10/2022 Discharge date: 01/12/2022  Recommendations for Outpatient Follow-up:  Follow up with PCP in 1-2 weeks Please obtain CMP/CBC in one week Continue counseling regarding compliance to medications Continue counseling regarding stopping further cocaine use  Admitted From:  Home  Disposition: Home   Discharge Condition: good  CODE STATUS:   Code Status: Full Code   Diet recommendation:  Diet Order             Diet - low sodium heart healthy           Diet Carb Modified           Diet heart healthy/carb modified Room service appropriate? Yes; Fluid consistency: Thin  Diet effective now                    Brief Summary: Patient is a 41 y.o.  male with history of chronic HFrEF, cocaine use, HTN, DM-2-who presented with headache, nausea, vomiting-found to have hypertensive emergency and subsequently started on IV nitroglycerin infusion and admitted to [facility] service.   Significant events: 11/7>> admit to [facility]-hypertensive emergency   Significant studies: 5/3>> Echo: EF 40-45% 11/7>> CXR: No PNA 11/8>> UDS: Positive for cocaine/opiates.   Significant microbiology data: None   Procedures: None   Consults: None  Brief Hospital Course: Hypertensive emergency Initially stared on Nitro infusion, oral meds were reintroduced, with better BP control, nitro gtt was discontinued. BP stable this am, will d/c on current dosing of amlodipine, losartan and aldactone. Will avoid beta blocker for now given ongoing cocaine use, this can be revisited at next appt with PCP/Cardiology.  Counseled extensively regarding importance of stopping cocaine use and importance of being compliant to his antihypertensives (acknowledges this morning that he ran of out his meds a few days back)    Chronic HFrEF Euvolemic Continue losartan/Aldactone/Jardiance Avoid Coreg for now given active cocaine use. [facility]bsided CHF team-they will arrange for a follow up in the office   History of LV thrombus Review of prior outpatient cardiology note-no longer on Eliquis   DM-2 (A1c 6.1 on 11/8) CBG stable-continue SSI/Jardiance  Medication noncompliance:  Counseled   Cocaine use Counseled extensively Claims that he sells cocaine and thinks that it gets absorbed into his system from his skin.    Tobacco use Counselled   BMI: Estimated body mass index is 21.46 kg/m as calculated from the following:   Height as of this encounter: _0  (1.702 m).   Weight as of this encounter: 62.1 kg.     Discharge Diagnoses:  Principal Problem:   Hypertensive urgency, malignant Active Problems:   Nicotine dependence, cigarettes, uncomplicated   HFrEF (heart failure with reduced ejection fraction) ([place])   Hypertension associated with diabetes ([place])   Type 2 diabetes mellitus without complication, with long-term current use of insulin ([place])   Polysubstance abuse [facility])   Discharge Instructions:  Activity:  As tolerated   Discharge Instructions     (HEART FAILURE PATIENTS) Call MD:  Anytime you have any of the following symptoms: 1) 3 pound weight gain in 24 hours or 5 pounds in 1 week 2) shortness of breath, with or without a dry hacking cough 3) swelling in the hands, feet or stomach 4) if you have to sleep on extra pillows at night in order to breathe.   Complete  by: As directed    Diet - low sodium heart healthy   Complete by: As directed    Diet Carb Modified   Complete by: As directed    Discharge instructions   Complete by: As directed    Follow with Primary MD  Kerin Perna, NP in 1-2 weeks  Please get a complete blood count and chemistry panel checked by your Primary MD at your next visit, and again as instructed by your Primary MD.  Get Medicines reviewed and  adjusted: Please take all your medications with you for your next visit with your Primary MD  Laboratory/radiological data: Please request your Primary MD to go over all hospital tests and procedure/radiological results at the follow up, please ask your Primary MD to get all Hospital records sent to his/her office.  In some cases, they will be blood work, cultures and biopsy results pending at the time of your discharge. Please request that your primary care M.D. follows up on these results.  Also Note the following: If you experience worsening of your admission symptoms, develop shortness of breath, life threatening emergency, suicidal or homicidal thoughts you must seek medical attention immediately by calling 911 or calling your MD immediately  if symptoms less severe.  You must read complete instructions/literature along with all the possible adverse reactions/side effects for all the Medicines you take and that have been prescribed to you. Take any new Medicines after you have completely understood and accpet all the possible adverse reactions/side effects.   Do not drive when taking Pain medications or sleeping medications (Benzodaizepines)  Do not take more than prescribed Pain, Sleep and Anxiety Medications. It is not advisable to combine anxiety,sleep and pain medications without talking with your primary care practitioner  Special Instructions: If you have smoked or chewed Tobacco  in the last 2 yrs please stop smoking, stop any regular Alcohol  and or any Recreational drug use.  Wear Seat belts while driving.  Please note: You were cared for by a hospitalist during your hospital stay. Once you are discharged, your primary care physician will handle any further medical issues. Please note that NO REFILLS for any discharge medications will be authorized once you are discharged, as it is imperative that you return to your primary care physician (or establish a relationship with a  primary care physician if you do not have one) for your post hospital discharge needs so that they can reassess your need for medications and monitor your lab values.   Increase activity slowly   Complete by: As directed       Allergies as of 01/12/2022   No Known Allergies      Medication List     STOP taking these medications    carvedilol 3.125 MG tablet Commonly known as: COREG   folic acid 1 MG tablet Commonly known as: FOLVITE   insulin aspart 100 UNIT/ML injection Commonly known as: novoLOG   ondansetron 4 MG disintegrating tablet Commonly known as: ZOFRAN-ODT   ondansetron 4 MG tablet Commonly known as: Zofran   pantoprazole 40 MG tablet Commonly known as: PROTONIX   PEN NEEDLES 31GX5/16" 31G X 8 MM Misc   polyethylene glycol 17 g packet Commonly known as: MIRALAX / GLYCOLAX       TAKE these medications    acetaminophen 500 MG tablet Commonly known as: TYLENOL Take 500 mg by mouth every 6 (six) hours as needed for moderate pain.   amLODipine 5 MG tablet Commonly known as:  NORVASC Take 1 tablet (5 mg total) by mouth daily.   blood glucose meter kit and supplies Kit Dispense based on patient and insurance preference. Use up to four times daily as directed.   diphenhydramine-acetaminophen 25-500 MG Tabs tablet Commonly known as: TYLENOL PM Take 1 tablet by mouth at bedtime as needed (sleep).   doxylamine (Sleep) 25 MG tablet Commonly known as: UNISOM Take 25 mg by mouth at bedtime as needed for sleep.   empagliflozin 10 MG Tabs tablet Commonly known as: Jardiance Take 1 tablet (10 mg total) by mouth daily before breakfast.   losartan 25 MG tablet Commonly known as: COZAAR Take 2 tablets (50 mg total) by mouth daily. What changed: how much to take   spironolactone 25 MG tablet Commonly known as: ALDACTONE Take 1 tablet (25 mg total) by mouth daily. What changed: how much to take        Follow-up Information     [facility] on 01/25/2022.   Specialty: Cardiology Why: at 1100 Contact information: [address] [id number] [place] [phone]               No Known Allergies   Other Procedures/Studies: DG Chest 2 View  Result Date: 01/10/2022 CLINICAL DATA:  Chest pain EXAM: CHEST - 2 VIEW COMPARISON:  Chest 07/19/2021 FINDINGS: The heart size and mediastinal contours are within normal limits. Both lungs are clear. The visualized skeletal structures are unremarkable. IMPRESSION: No active cardiopulmonary disease. Electronically Signed   By: Franchot Gallo M.D.   On: 01/10/2022 13:19   SLEEP STUDY DOCUMENTS  Result Date: 12/20/2021 Ordered by an unspecified provider.    TODAY-DAY OF DISCHARGE:  Subjective:   Bashar Milam today has no headache,no chest abdominal pain,no new weakness tingling or numbness, feels much better wants to go home today.   Objective:   Blood pressure (!) 140/113, pulse (!) 103, temperature 98.3 F (36.8 C), temperature source Oral, resp. rate 20, height _0  (1.702 m), weight 56.6 kg, SpO2 100 %.  Intake/Output Summary (Last 24 hours) at 01/12/2022 0755 Last data filed at 01/12/2022 0405 Gross per 24 hour  Intake 360 ml  Output --  Net 360 ml   Filed Weights   01/10/22 1205 01/11/22 1655 01/12/22 0400  Weight: 62.1 kg 56.6 kg 56.6 kg    Exam: Awake Alert, Oriented *3, No new F.N deficits, Normal affect [place].AT,PERRAL Supple Neck,No JVD, No cervical lymphadenopathy appriciated.  Symmetrical Chest wall movement, Good air movement bilaterally, CTAB RRR,No Gallops,Rubs or new Murmurs, No Parasternal Heave +ve B.Sounds, Abd Soft, Non tender, No organomegaly appriciated, No rebound -guarding or rigidity. No Cyanosis, Clubbing or edema, No new Rash or bruise   PERTINENT RADIOLOGIC STUDIES: DG Chest 2 View  Result Date: 01/10/2022 CLINICAL DATA:  Chest pain EXAM: CHEST - 2 VIEW  COMPARISON:  Chest 07/19/2021 FINDINGS: The heart size and mediastinal contours are within normal limits. Both lungs are clear. The visualized skeletal structures are unremarkable. IMPRESSION: No active cardiopulmonary disease. Electronically Signed   By: Franchot Gallo M.D.   On: 01/10/2022 13:19     PERTINENT LAB RESULTS: CBC: Recent Labs    01/10/22 1217 01/11/22 0030  WBC 7.9 8.3  HGB 13.1 13.7  HCT 40.8 38.8*  PLT 389 445*   CMET CMP     Component Value Date/Time   NA 135 01/11/2022 0030   NA 134 04/18/2017 1124   K 3.6  01/11/2022 0030   CL 99 01/11/2022 0030   CO2 23 01/11/2022 0030   GLUCOSE 151 (H) 01/11/2022 0030   BUN 13 01/11/2022 0030   BUN 7 04/18/2017 1124   CREATININE 1.03 01/11/2022 0030   CREATININE 0.81 04/05/2016 1547   CALCIUM 9.8 01/11/2022 0030   PROT 7.9 01/11/2022 0030   PROT 7.6 04/18/2017 1124   ALBUMIN 4.4 01/11/2022 0030   ALBUMIN 4.4 04/18/2017 1124   AST 22 01/11/2022 0030   ALT 14 01/11/2022 0030   ALKPHOS 42 01/11/2022 0030   BILITOT 1.3 (H) 01/11/2022 0030   BILITOT 1.2 04/18/2017 1124   GFRNONAA >60 01/11/2022 0030   GFRNONAA >89 04/05/2016 1547   GFRAA >60 10/04/2019 1016   GFRAA >89 04/05/2016 1547    GFR Estimated Creatinine Clearance: 77.1 mL/min (by C-G formula based on SCr of 1.03 mg/dL). Recent Labs    01/10/22 1217  LIPASE 30   No results for input(s): "CKTOTAL", "CKMB", "CKMBINDEX", "TROPONINI" in the last 72 hours. Invalid input(s): "POCBNP" No results for input(s): "DDIMER" in the last 72 hours. Recent Labs    01/11/22 0030  HGBA1C 6.1*   No results for input(s): "CHOL", "HDL", "LDLCALC", "TRIG", "CHOLHDL", "LDLDIRECT" in the last 72 hours. No results for input(s): "TSH", "T4TOTAL", "T3FREE", "THYROIDAB" in the last 72 hours.  Invalid input(s): "FREET3" No results for input(s): "VITAMINB12", "FOLATE", "FERRITIN", "TIBC", "IRON", "RETICCTPCT" in the last 72 hours. Coags: No results for input(s): "INR" in the  last 72 hours.  Invalid input(s): "PT" Microbiology: No results found for this or any previous visit (from the past 240 hour(s)).  FURTHER DISCHARGE INSTRUCTIONS:  Get Medicines reviewed and adjusted: Please take all your medications with you for your next visit with your Primary MD  Laboratory/radiological data: Please request your Primary MD to go over all hospital tests and procedure/radiological results at the follow up, please ask your Primary MD to get all Hospital records sent to his/her office.  In some cases, they will be blood work, cultures and biopsy results pending at the time of your discharge. Please request that your primary care M.D. goes through all the records of your hospital data and follows up on these results.  Also Note the following: If you experience worsening of your admission symptoms, develop shortness of breath, life threatening emergency, suicidal or homicidal thoughts you must seek medical attention immediately by calling 911 or calling your MD immediately  if symptoms less severe.  You must read complete instructions/literature along with all the possible adverse reactions/side effects for all the Medicines you take and that have been prescribed to you. Take any new Medicines after you have completely understood and accpet all the possible adverse reactions/side effects.   Do not drive when taking Pain medications or sleeping medications (Benzodaizepines)  Do not take more than prescribed Pain, Sleep and Anxiety Medications. It is not advisable to combine anxiety,sleep and pain medications without talking with your primary care practitioner  Special Instructions: If you have smoked or chewed Tobacco  in the last 2 yrs please stop smoking, stop any regular Alcohol  and or any Recreational drug use.  Wear Seat belts while driving.  Please note: You were cared for by a hospitalist during your hospital stay. Once you are discharged, your primary care  physician will handle any further medical issues. Please note that NO REFILLS for any discharge medications will be authorized once you are discharged, as it is imperative that you return to your primary care physician (  or establish a relationship with a primary care physician if you do not have one) for your post hospital discharge needs so that they can reassess your need for medications and monitor your lab values.  Total Time spent coordinating discharge including counseling, education and face to face time equals greater than 30 minutes.  SignedOren Binet 01/12/2022 7:55 AM

## 2022-01-16 ENCOUNTER — Telehealth: Payer: Self-pay

## 2022-01-16 NOTE — Telephone Encounter (Signed)
Transition Care Management Follow-up Telephone Call Date of discharge and from where: 01/12/2022, [facility] How have you been since you were released from the hospital? He did not report any problems/concerns. There was a great deal of noise in the background and it was difficult to hear him.  Any questions or concerns? No  Items Reviewed: Did the pt receive and understand the discharge instructions provided? Yes  Medications obtained and verified?  He said that he doesn't have them all, he still needs to pick up one but he was not sure of the name  Other? No  Any new allergies since your discharge? No  Dietary orders reviewed? No Do you have support at home? Yes   Home Care and Equipment/Supplies: Were home health services ordered? no If so, what is the name of the agency? N/a  Has the agency set up a time to come to the patient's home? not applicable Were any new equipment or medical supplies ordered?  No What is the name of the medical supply agency? N/a Were you able to get the supplies/equipment? not applicable Do you have any questions related to the use of the equipment or supplies? No  Functional Questionnaire: (I = Independent and D = Dependent) ADLs: independent He receives community paramedicine services from Kerry Hough, EMT  Follow up appointments reviewed:  PCP Hospital f/u appt confirmed?  He had an appointment scheduled at [facility] with Gwinda Passe, NP - 02/07/2022 but he said he has a new PCP and is not going to [facility] any longer. He could not tell me if he has an appointment with the new provider.  He said he will need to check with his wife.  Specialist Hospital f/u appt confirmed? Yes  Scheduled to see [facility]- 01/25/2022.   Are transportation arrangements needed? No  If their condition worsens, is the pt aware to call PCP or go to the Emergency Dept.? Yes Was the patient provided with contact information for the PCP's office or ED? Yes Was to pt encouraged to  call back with questions or concerns? Yes

## 2022-01-18 ENCOUNTER — Telehealth (HOSPITAL_COMMUNITY): Payer: Self-pay

## 2022-01-18 NOTE — Telephone Encounter (Signed)
Contacted pt ref home visit this week.  No answer on either phone. I was able to LVM on one line and VM not set up on the other.   Kerry Hough, EMT-Paramedic  [phone] 01/18/2022

## 2022-01-24 ENCOUNTER — Telehealth (HOSPITAL_COMMUNITY): Payer: Self-pay

## 2022-01-24 NOTE — Telephone Encounter (Signed)
Called and was unable to leave a voice message to confirm/remind patient of their appointment [facility] on [facility]/25/[facility].

## 2022-01-25 ENCOUNTER — Encounter (HOSPITAL_COMMUNITY): Payer: Commercial Managed Care - HMO

## 2022-01-25 NOTE — Progress Notes (Incomplete)
[facility] PROGRESS NOTE   Primary Care: No primary care provider on file. Primary Cardiologist: Dr. Harrell Gave HF Cardiologist: Dr. Haroldine Laws  Reason for Visit: f/u for chronic systolic heart failure, post hospital f/u for hypertensive emergency   HPI: David [name] is a 41 y.o. with h/o  LV thrombus, GI bleed, gastritis, cocaine abuse, DMII, HTN, QQT prolongation, and systolic/diastolic HF.    Admitted 05/13/21 with N/V followed by syncope. On admit had AKI, hypokalemia, and hypocalcemia. UDS + cocaine and opitates. ECHO with EF 25-30%. Cath preserved cardiac output and normal cors. Started on GDMT. Started on DOAC for possible LV thrombus.    Seen in [facility] 3/23, volume up, NYHA III symptoms. Arlyce Harman and [place] added. Referred to the [facility].   Echo 07/06/21: EF 40-45% RV normal No LV thrombus.   Admitted 5/23 with syncope and AKI, UDS + cocaine. Given IVF and GDMT held. Cards consulted due to elevated troponin. With no CP, elevated trop felt 2/2 to AKI and hypotension. Carvedilol resumed, discharged home, weight 148 lbs. Seen for post hospital f/u 5/23 and was doing well.   Follow up 10/31/21, NYHA III and out of all meds x 3 weeks. Meds restarted and referred to Paramedicine. Unfortunately, Para medicine has had difficulty getting in contact w/ him to arrange visitis.   Sleep study done 10/23, demonstrated moderate OSA. AHI 23.6/hr.   Recently presented to ED 01/10/22 w/ headache, nausea, vomiting-found to have hypertensive emergency and subsequently started on IV nitroglycerin infusion and admitted to [facility] service. UDS was + for cocaine. Reported poor med compliance. Suprisingly, he did not have acute HF. CXR showed no active cardiopulmonary disease. BNP 57. BP improved w/ restart of oral HF/BP active meds (except ? blocker given cocaine usage). Weaned off NTG gtt. Discharged home on 01/12/22.  He presents to clinic today for post hospital follow-up.      Cardiac Testing   - Echo  (5/23): EF 40-45%, RV normal, no LV thrombus  - Echo (3/23): EF 25-30%  with global HK, small LV thrombus. Possible Takotsubo  - Echo (6/22): EF 50-55% RV normal    - LHC (05/16/21): with no CAD, normal LVEDP -> CM is nonischemic   Sleep Study 10/23 IMPRESSIONS - Mild obstructive sleep apnea occurred during this study (AHI 7.1/h; RDI 12.8/h); however, sleep apnea was moderate with supine sleep (AHI 23.6/h). - The patient had minimal or no oxygen desaturation during the study (Min O2 91.0%) - The patient snored with moderate to loud snoring volume. - No cardiac abnormalities were noted during this study. - Clinically significant periodic limb movements did not occur during sleep. No significant associated arousals.   DIAGNOSIS - Obstructive Sleep Apnea (G47.33) - Moderate to loud Snoring   RECOMMENDATIONS - In this patient with cardiovascular comorbidities recommend therapeutic CPAP for treatment of his sleep disordered breathing. Initiate a trial of Auto-PAP with EPR of 3 at 6 - 15 cm of water.  If patient is against CPAP initiation can consider alternative therapy with a customized oral appliance.  - Effort should be made to optimize nasal and oropharyngeal patency.  - The patient should be counseled to avoid supine sleep; consider positional therapy avoiding supine position during sleep. - Avoid alcohol, sedatives and other CNS depressants that may worsen sleep apnea and disrupt normal sleep architecture. - Sleep hygiene should be reviewed to assess factors that may improve sleep quality. - Weight management and regular exercise should be initiated or continued if appropriate.  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     amLODipine (NORVASC) 5 MG tablet Take 1 tablet (5 mg total) by mouth daily. 30 tablet 2   blood glucose meter kit and supplies KIT Dispense based on patient and insurance preference. Use up to four times daily as directed. (Patient not taking: Reported on 11/15/2021) 1 each 0   dapagliflozin propanediol (FARXIGA) 10 MG TABS tablet Take 1 tablet (10 mg total) by mouth daily. 30 tablet 6   diphenhydramine-acetaminophen (TYLENOL PM) 25-500 MG TABS tablet Take 1 tablet by mouth at bedtime as needed (sleep).     doxylamine, Sleep, (UNISOM) 25 MG tablet Take 25 mg by mouth at bedtime as needed for sleep.     losartan (COZAAR) 25 MG tablet Take 2 tablets (50 mg total) by mouth daily. 60 tablet 2   spironolactone (ALDACTONE) 25 MG tablet Take 1 tablet (25 mg total) by mouth daily. 30 tablet 2   No current facility-administered medications for this visit.   No Known Allergies  Social History   Socioeconomic History   Marital status: Married    Spouse name: Not on file   Number of children: Not on file   Years of education: Not on file   Highest education level: Not on file  Occupational History   Not on file  Tobacco Use   Smoking status: Every Day    Packs/day: 1.00    Years: 10.00    Total pack years: 10.00    Types: Cigarettes   Smokeless tobacco: Never  Vaping Use   Vaping Use: Never used  Substance and Sexual Activity   Alcohol use: Yes    Alcohol/week: 30.0 standard drinks of alcohol    Types: 30 Shots of liquor per week    Comment: minimum 4 shots of liquor daily   Drug use: Yes    Types: Oxycodone, Marijuana   Sexual activity: Yes    Birth control/protection: Condom  Other Topics Concern   Not on file  Social History Narrative   Not on file   Social Determinants of Health   Financial Resource Strain: High Risk (10/31/2021)   Overall Financial Resource Strain  (CARDIA)    Difficulty of Paying Living Expenses: Hard  Food Insecurity: No Food Insecurity (10/31/2021)   Hunger Vital Sign    Worried About Running Out of Food in the Last Year: Never true    Ran Out of Food in the Last Year: Never true  Transportation Needs: No Transportation Needs (10/31/2021)   PRAPARE - Hydrologist (Medical): No    Lack of Transportation (Non-Medical): No  Physical Activity: Not on file  Stress: Not on file  Social Connections: Not on file  Intimate Partner Violence: Not on file   Family History  Problem Relation Age of Onset   Diabetes Father    There were no vitals taken for this visit.  Wt Readings from Last 3 Encounters:  01/12/22 56.6 kg (124 lb 12.8 oz)  12/18/21 62.1 kg (137 lb)  11/15/21 62.1 kg (137 lb)  PHYSICAL EXAM: General:  NAD. No resp difficulty, falling asleep during visit HEENT: Normal Neck: Supple. No JVD. Carotids 2+ bilat; no bruits. No lymphadenopathy or thryomegaly appreciated. Cor: PMI nondisplaced. Regular rate & rhythm. No rubs, gallops or murmurs. Lungs: Clear Abdomen: Soft, nontender, nondistended. No hepatosplenomegaly. No bruits or masses. Good bowel sounds. Extremities: No cyanosis, clubbing, rash, edema Neuro: Alert & oriented x 3, cranial nerves grossly intact. Moves all 4 extremities w/o difficulty. Affect pleasant.  ASSESSMENT & PLAN: 1.  Chronic systolic heart failure, NICM  - Echo (3/23): EF 25-30% Small LV thrombus. No cMRI with no insurance. - Cath (3/23): normal cors and stable hemodynamics. - Echo (5/23): EF 40-45% RV normal - NYHA II. Volume looks good today. - Restart Jardiance 10 mg daily.  - Restart spironolactone 12.5 mg qhs - Restart losartan 12.5 mg qhs (previously on 50 mg, felt bad w/ increased dizziness w/ this dose) - Continue paramedicine. - Add back Coreg next visit. - Labs next visit. - I asked him to pick up his meds today from pharmacy.   2. LV Thrombus -  Resolved on f/u echo 5/23 with improvement in LV function - No longer on Eliquis    3. Cocaine Abuse/Tobacco abuse - Says he is not using cocaine - Continue to smoke 1/2 ppd.  - Discussed need to quit   4. DMII - Last Hgb A1c 6.7  - followed by PCP  - Continue SGLT2i    5. Prolonged QT  - Stable on recent ECG (10/31/21, QT/QTc 390/471 ms)  6. HTN  - Controlled . - GDMT as above.  7. OSA  8. Poor Compliance/ Low Health Literacy  - Continue paramedicine - He has a scale, transportation and cell phone. - Wife appears motivated to help with med management. - He has insurance.  9. Vomiting - Sounds like reflux - Needs to get back on PPI - Advised follow up with PCP. May need GI referral.  Follow up in 2 weeks with APP.  Lyda Jester, PA-C  8:05 AM

## 2022-01-27 ENCOUNTER — Other Ambulatory Visit (HOSPITAL_COMMUNITY): Payer: Self-pay

## 2022-01-30 ENCOUNTER — Telehealth (HOSPITAL_COMMUNITY): Payer: Self-pay

## 2022-01-30 NOTE — Telephone Encounter (Signed)
Patient is now discharged from [facility].  Patient has/has not met the following goals:  Yes :Patient expresses basic understanding of medications and what they are for Yes :Patient able to verbalize heart failure specific dietary/fluid restrictions Yes :Patient is aware of who to call if they have medical concerns or if they need to schedule or change appts No :Patient has a scale for daily weights and weighs regularly Pending :Patient able to verbalize concerning symptoms when they should call the HF clinic (weight gain ranges, etc) Yes :Patient has a PCP and has seen within the past year or has upcoming appt Yes :Patient has reliable access to getting their medications Yes :Patient has shown they are able to reorder medications reliably Yes :Patient has had admission in past 30 days- if yes how many? No :Patient has had admission in past 90 days- if yes how many?  Discharge Comments:   Pt has been a no show to appointments, last seen in the home I had to send him to ER for hypertension but he has been med non-complaint for some time. +cocaine use this last admission. The other weeks he has not responded to phone calls in making home visit appointments. The other most recent home visit was back in September.  Consulted with amy and jenna and it was agreed for him to be d/c from paramedicine at this time.    Marylouise Stacks, [place] 01/30/2022

## 2022-02-07 ENCOUNTER — Inpatient Hospital Stay (INDEPENDENT_AMBULATORY_CARE_PROVIDER_SITE_OTHER): Payer: Commercial Managed Care - HMO | Admitting: Primary Care

## 2022-02-23 ENCOUNTER — Telehealth: Payer: Self-pay | Admitting: *Deleted

## 2022-02-23 NOTE — Telephone Encounter (Signed)
Incoming fax received from [facility] informing me the patient has been scheduled 3 times for CPAP set up. He has "No Showed " for all 3 appointments. He will not be rescheduled. Order will be cancelled.

## 2022-03-10 ENCOUNTER — Other Ambulatory Visit (HOSPITAL_COMMUNITY): Payer: Self-pay

## 2022-05-15 ENCOUNTER — Telehealth (HOSPITAL_COMMUNITY): Payer: Self-pay

## 2022-05-15 NOTE — Telephone Encounter (Signed)
Called to confirm/remind patient of their appointment at the [facility] on 05/16/22.   Patient reminded to bring all medications and/or complete list.  Confirmed patient has transportation. Gave directions, instructed to utilize [place] parking.  Confirmed appointment prior to ending call.

## 2022-05-16 ENCOUNTER — Encounter (HOSPITAL_COMMUNITY): Payer: Commercial Managed Care - HMO

## 2022-05-29 ENCOUNTER — Other Ambulatory Visit: Payer: Self-pay

## 2022-05-29 ENCOUNTER — Encounter (HOSPITAL_COMMUNITY): Payer: Self-pay

## 2022-05-29 ENCOUNTER — Emergency Department (HOSPITAL_COMMUNITY): Payer: Medicaid Other

## 2022-05-29 ENCOUNTER — Emergency Department (HOSPITAL_COMMUNITY)
Admission: EM | Admit: 2022-05-29 | Discharge: 2022-05-29 | Disposition: A | Discharge status: Home / Self Care | Payer: Medicaid Other | Attending: Emergency Medicine | Admitting: Emergency Medicine

## 2022-05-29 DIAGNOSIS — R0602 Shortness of breath: Secondary | ICD-10-CM | POA: Diagnosis not present

## 2022-05-29 DIAGNOSIS — Z79899 Other long term (current) drug therapy: Secondary | ICD-10-CM | POA: Diagnosis not present

## 2022-05-29 DIAGNOSIS — R000 Tachycardia, unspecified: Secondary | ICD-10-CM | POA: Insufficient documentation

## 2022-05-29 DIAGNOSIS — I110 Hypertensive heart disease with heart failure: Secondary | ICD-10-CM | POA: Diagnosis not present

## 2022-05-29 DIAGNOSIS — R5383 Other fatigue: Secondary | ICD-10-CM | POA: Diagnosis not present

## 2022-05-29 DIAGNOSIS — Z7984 Long term (current) use of oral hypoglycemic drugs: Secondary | ICD-10-CM | POA: Insufficient documentation

## 2022-05-29 DIAGNOSIS — E119 Type 2 diabetes mellitus without complications: Secondary | ICD-10-CM | POA: Diagnosis not present

## 2022-05-29 DIAGNOSIS — I509 Heart failure, unspecified: Secondary | ICD-10-CM | POA: Insufficient documentation

## 2022-05-29 DIAGNOSIS — Z20822 Contact with and (suspected) exposure to covid-19: Secondary | ICD-10-CM | POA: Diagnosis not present

## 2022-05-29 DIAGNOSIS — D72829 Elevated white blood cell count, unspecified: Secondary | ICD-10-CM | POA: Insufficient documentation

## 2022-05-29 DIAGNOSIS — E871 Hypo-osmolality and hyponatremia: Secondary | ICD-10-CM | POA: Diagnosis not present

## 2022-05-29 DIAGNOSIS — Y900 Blood alcohol level of less than 20 mg/100 ml: Secondary | ICD-10-CM | POA: Diagnosis not present

## 2022-05-29 LAB — CBC
HCT: 51.1 % (ref 39.0–52.0)
Hemoglobin: 16.9 g/dL (ref 13.0–17.0)
MCH: 27.7 pg (ref 26.0–34.0)
MCHC: 33.1 g/dL (ref 30.0–36.0)
MCV: 83.8 fL (ref 80.0–100.0)
Platelets: 348 10*3/uL (ref 150–400)
RBC: 6.1 MIL/uL — ABNORMAL HIGH (ref 4.22–5.81)
RDW: 13.9 % (ref 11.5–15.5)
WBC: 12.6 10*3/uL — ABNORMAL HIGH (ref 4.0–10.5)
nRBC: 0 % (ref 0.0–0.2)

## 2022-05-29 LAB — COMPREHENSIVE METABOLIC PANEL
ALT: 15 U/L (ref 0–44)
AST: 22 U/L (ref 15–41)
Albumin: 4.6 g/dL (ref 3.5–5.0)
Alkaline Phosphatase: 53 U/L (ref 38–126)
Anion gap: 11 (ref 5–15)
BUN: 27 mg/dL — ABNORMAL HIGH (ref 6–20)
CO2: 28 mmol/L (ref 22–32)
Calcium: 9.4 mg/dL (ref 8.9–10.3)
Chloride: 95 mmol/L — ABNORMAL LOW (ref 98–111)
Creatinine, Ser: 1.05 mg/dL (ref 0.61–1.24)
GFR, Estimated: >60 mL/min (ref >60)
Glucose, Bld: 178 mg/dL — ABNORMAL HIGH (ref 70–99)
Potassium: 3.5 mmol/L (ref 3.5–5.1)
Sodium: 134 mmol/L — ABNORMAL LOW (ref 135–145)
Total Bilirubin: 1.4 mg/dL — ABNORMAL HIGH (ref 0.3–1.2)
Total Protein: 8.4 g/dL — ABNORMAL HIGH (ref 6.5–8.1)

## 2022-05-29 LAB — BRAIN NATRIURETIC PEPTIDE: B Natriuretic Peptide: 53.3 pg/mL (ref 0.0–100.0)

## 2022-05-29 LAB — ETHANOL: Alcohol, Ethyl (B): <10 mg/dL (ref <10)

## 2022-05-29 LAB — SARS CORONAVIRUS 2 BY RT PCR: SARS Coronavirus 2 by RT PCR: NEGATIVE

## 2022-05-29 LAB — LIPASE, BLOOD: Lipase: 27 U/L (ref 11–51)

## 2022-05-29 MED ORDER — ALBUTEROL SULFATE HFA 108 (90 BASE) MCG/ACT IN AERS
2.0000 | INHALATION_SPRAY | Freq: Once | RESPIRATORY_TRACT | Status: DC
  Filled 2022-05-29: qty 6.7

## 2022-05-29 MED ORDER — SODIUM CHLORIDE 0.9 % IV BOLUS: 1000.0000 mL | Freq: Once | INTRAVENOUS | Status: DC

## 2022-05-29 NOTE — ED Provider Notes (Signed)
[facility] Provider Note   CSN: [id number] Arrival date & time: 05/29/22  1519     History  Chief Complaint  Patient presents with   Fatigue   Shortness of Breath    David [name] is a 42 y.o. malePt complains of shortness of breath, nausea, vomiting, abdominal pain since last night.  Patient reports that he was taking some Percocet and drinking.  He reports he has a history of congestive heart failure, he denies any chest pain, he denies any swelling, he cannot tell me whether or not he is taking any medication for his heart failure.    Shortness of Breath      Home Medications Prior to Admission medications   Medication Sig Start Date End Date Taking? Authorizing Provider  acetaminophen (TYLENOL) 500 MG tablet Take 500 mg by mouth every 6 (six) hours as needed for moderate pain.    [provider]  amLODipine (NORVASC) 5 MG tablet Take 1 tablet (5 mg total) by mouth daily. 01/12/22   Ghimire, Henreitta Leber, MD  blood glucose meter kit and supplies KIT Dispense based on patient and insurance preference. Use up to four times daily as directed. Patient not taking: Reported on 11/15/2021 05/17/21   Kayleen Memos, DO  dapagliflozin propanediol (FARXIGA) 10 MG TABS tablet Take 1 tablet (10 mg total) by mouth daily. 01/12/22   Ghimire, Henreitta Leber, MD  diphenhydramine-acetaminophen (TYLENOL PM) 25-500 MG TABS tablet Take 1 tablet by mouth at bedtime as needed (sleep).    [provider]  doxylamine, Sleep, (UNISOM) 25 MG tablet Take 25 mg by mouth at bedtime as needed for sleep.    [provider]  losartan (COZAAR) 25 MG tablet Take 2 tablets (50 mg total) by mouth daily. 01/12/22   Ghimire, Henreitta Leber, MD  spironolactone (ALDACTONE) 25 MG tablet Take 1 tablet (25 mg total) by mouth daily. 01/12/22   Ghimire, Henreitta Leber, MD      Allergies    Patient has no known allergies.    Review of Systems   Review of Systems   Respiratory:  Positive for shortness of breath.   All other systems reviewed and are negative.   Physical Exam Updated Vital Signs BP (!) 176/129   Pulse (!) 128   Temp 99 F (37.2 C) (Oral)   Resp (!) 24   Wt 56 kg   SpO2 (!) 89%   BMI 19.34 kg/m  Physical Exam Vitals and nursing note reviewed.  Constitutional:      General: He is not in acute distress.    Appearance: Normal appearance.  HENT:     Head: Normocephalic and atraumatic.  Eyes:     General:        Right eye: No discharge.        Left eye: No discharge.  Cardiovascular:     Rate and Rhythm: Normal rate and regular rhythm.     Heart sounds: No murmur heard.    No friction rub. No gallop.     Comments: Patient with mild tachycardia, normal rhythm on initial evaluation, he had a vital sign reading taken just before patient eloped the emergency department that was unable to be verified with pulse of 128 Pulmonary:     Effort: Pulmonary effort is normal.     Breath sounds: Normal breath sounds.     Comments: Patient with mild expiratory wheezing, no respiratory distress overall occasional scattered rhonchi that clear with  cough.  No focal consolidation noted. Abdominal:     General: Bowel sounds are normal.     Palpations: Abdomen is soft.  Skin:    General: Skin is warm and dry.     Capillary Refill: Capillary refill takes less than 2 seconds.  Neurological:     Mental Status: He is alert and oriented to person, place, and time.  Psychiatric:        Mood and Affect: Mood normal.        Behavior: Behavior normal.     ED Results / Procedures / Treatments   Labs (all labs ordered are listed, but only abnormal results are displayed) Labs Reviewed  COMPREHENSIVE METABOLIC PANEL - Abnormal; Notable for the following components:      Result Value   Sodium 134 (*)    Chloride 95 (*)    Glucose, Bld 178 (*)    BUN 27 (*)    Total Protein 8.4 (*)    Total Bilirubin 1.4 (*)    All other components within  normal limits  CBC - Abnormal; Notable for the following components:   WBC 12.6 (*)    RBC 6.10 (*)    All other components within normal limits  SARS CORONAVIRUS 2 BY RT PCR  LIPASE, BLOOD  BRAIN NATRIURETIC PEPTIDE  ETHANOL  URINALYSIS, ROUTINE W REFLEX MICROSCOPIC  RAPID URINE DRUG SCREEN, HOSP PERFORMED    EKG None  Radiology DG Chest 1 View  Result Date: 05/29/2022 CLINICAL DATA:  Shortness of breath EXAM: CHEST  1 VIEW COMPARISON:  Chest x-ray 01/10/2022 FINDINGS: The heart size and mediastinal contours are within normal limits. Both lungs are clear. The visualized skeletal structures are unremarkable. IMPRESSION: No active disease. Electronically Signed   By: Ronney Asters M.D.   On: 05/29/2022 17:08    Procedures Procedures    Medications Ordered in ED Medications  sodium chloride 0.9 % bolus 1,000 mL (1,000 mLs Intravenous Not Given 05/29/22 1922)  albuterol (VENTOLIN HFA) 108 (90 Base) MCG/ACT inhaler 2 puff (2 puffs Inhalation Not Given 05/29/22 1923)    ED Course/ Medical Decision Making/ A&P                             Medical Decision Making Amount and/or Complexity of Data Reviewed Labs: ordered. Radiology: ordered.  Risk Prescription drug management.   This patient is a 42 y.o. male  who presents to the ED for concern of shob, fatigue.   Differential diagnoses prior to evaluation: The emergent differential diagnosis includes, but is not limited to,  asthma exacerbation, COPD exacerbation, acute upper respiratory infection, acute bronchitis, chronic bronchitis, interstitial lung disease, ARDS, PE, pneumonia, atypical ACS, carbon monoxide poisoning, spontaneous pneumothorax, new CHF vs CHF exacerbation, versus CVA, spinal cord injury, ACS, arrhythmia, syncope, orthostatic hypotension, sepsis, hypoglycemia, hypoxia, electrolyte disturbance, endocrine disorder, anemia, environmental exposure, polypharmacy . This is not an exhaustive differential.   Past  Medical History / Co-morbidities: Diabetes, alcohol abuse, polysubstance abuse, opioid dependence, heart failure, hypertension is poorly controlled.  Additional history: Chart reviewed. Pertinent results include: Reviewed outpatient cardiology visits, previous echo, admission for hypertensive urgency back in November  Physical Exam: Physical exam performed. The pertinent findings include: Patient with stable oxygen saturation, without focal consolidation, he has mild wheezing, scattered rhonchi clear with cough.  Improved after resting.  He had an on confirmed vitals reading just before eloping from the emergency department with pulse rate of 128,  respirations 24 and oxygen saturation 89%.  Suspect these were spurious readings while he was tearing off of his monitoring equipment.  But overall clinical impression is that he may be mildly dehydrated secondary to poor personal care, and reported drug use.  Lab Tests/Imaging studies: I personally interpreted labs/imaging and the pertinent results include: CBC with mild leukocytosis, white blood cells 12.6, hemoglobin is high normal as well suggesting some possible hemoconcentration.  CMP notable for mild hyponatremia, sodium 134, BUN mildly elevated 27, as well as total bilirubin is mildly elevated 1.4, tachycardia these changes all confirm some signs of dehydration.  His BNP is normal, COVID test negative, lipase unremarkable, alcohol negative.  He did not provide [place] urine sample..  Independently interpreted plain from chest x-ray which shows no acute intra thoracic abnormality.  I agree with the radiologist interpretation.   Medications: I ordered medication including fluid bolus, albuterol inhaler, patient eloped from the emergency department prior to receiving these medications..  I have reviewed the patients home medicines and have made adjustments as needed.   Disposition: After consideration of the diagnostic results and the patients response to  treatment, I feel that is difficult for me to say what patient's ultimate disposition should be he is still quite hypertensive at time of his elopement with blood pressure maximum of 179/157 although this diastolic numbers higher than I would expect with 2 narrow pulse pressure for true reading, his more realistic blood pressure on elopement is 176/129.  He was seemingly tachypneic and tachycardic with decreasing oxygen saturation raising suspicion for PE or other abnormality causing his shortness of breath at this time, but as patient left the emergency department no further workup can be done at this time.   emergency department workup does not suggest an emergent condition requiring admission or immediate intervention beyond what has been performed at this time. The plan is: as above, patient eloped prior to completing care. The patient is safe for discharge and has been instructed to return immediately for worsening symptoms, change in symptoms or any other concerns.  Final Clinical Impression(s) / ED Diagnoses Final diagnoses:  Other fatigue  Shortness of breath    Rx / DC Orders ED Discharge Orders     None         Dorien Chihuahua 05/29/22 Allyson Sabal, MD 05/29/22 2054

## 2022-05-29 NOTE — ED Triage Notes (Addendum)
C/o n/v and fatigue with sob x2 days. Denies pain.  Denies weight gain.  Hx CHF

## 2022-05-29 NOTE — ED Notes (Signed)
Pt is aware of their hx of hypertension. Pt hasn't BP medication in a long time.

## 2022-05-29 NOTE — ED Notes (Signed)
Pt requested to leave. Provider made aware. Pt eloped

## 2022-05-29 NOTE — ED Provider Triage Note (Signed)
Emergency Medicine Provider Triage Evaluation Note  David [name] , a 42 y.o. male  was evaluated in triage.  Pt complains of shortness of breath, nausea, vomiting, abdominal pain since last night.  Patient reports that he was taking some Percocet and drinking.  He reports he has a history of congestive heart failure, he denies any chest pain, he denies any swelling, he cannot tell me whether or not he is taking any medication for his heart failure.  Review of Systems  Positive: Shob, abdominal pain, nausea, vomiting Negative: Fever, chills  Physical Exam  BP (!) 158/117 (BP Location: Left Arm)   Pulse (!) 101   Temp 99 F (37.2 C) (Oral)   Resp 20   Wt 56 kg   SpO2 100%   BMI 19.34 kg/m  Gen:   Awake, no distress   Resp:  Normal effort  MSK:   Moves extremities without difficulty  Other:  Patient is drowsy but easily arousable, he has no peripheral edema noted on my exam, he is mildly tachycardic with normal rhythm.  He has no accessory breath sounds on my exam.  Respirations somewhat shallow however.  Medical Decision Making  Medically screening exam initiated at 4:24 PM.  Appropriate orders placed.  David [name] was informed that the remainder of the evaluation will be completed by another provider, this initial triage assessment does not replace that evaluation, and the importance of remaining in the ED until their evaluation is complete  Workup initiated in triage    Anselmo Pickler, PA-C 05/29/22 1626

## 2022-05-29 NOTE — Discharge Instructions (Addendum)
Please use an inhaler as needed and follow-up with her primary care doctor return the emergency department if you have worsening symptoms

## 2022-06-29 IMAGING — CT CT HEAD W/O CM
3 of 4 series · 16 of 47 positions shown, 19 images · non-contrast
Comparison: 05/13/2021

CLINICAL DATA: Syncope, fell, right-sided facial abrasion



[Series 3: head 5.0 h30s · axial · 0.42mm/px · z∈[+1086,+1226]mm · 10 of 34 slices shown, 13 images]
[im 3/34  brain]
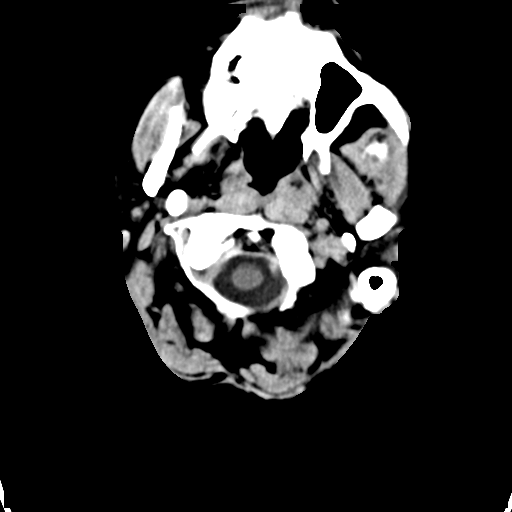
[im 3/34  bone]
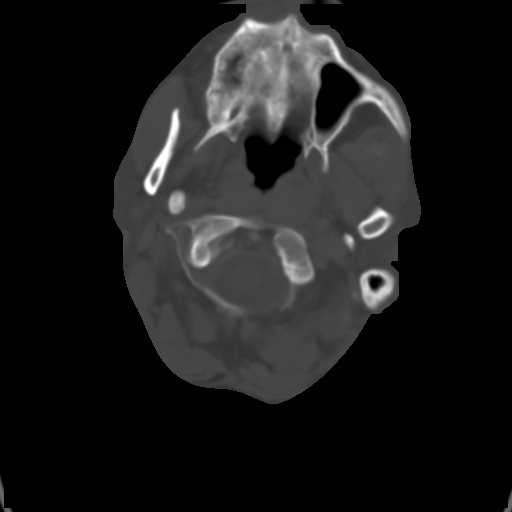
[im 5/34  brain]
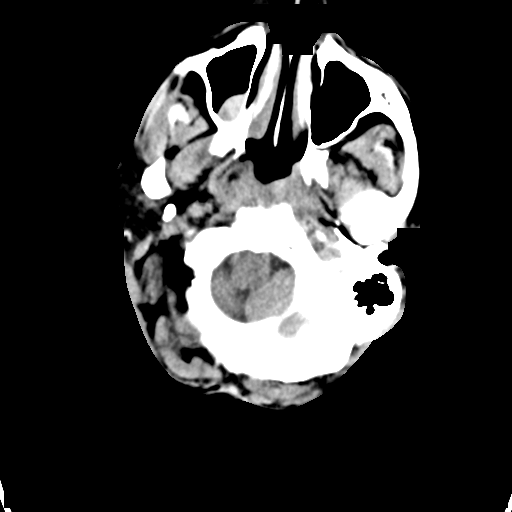
[im 10/34  brain]
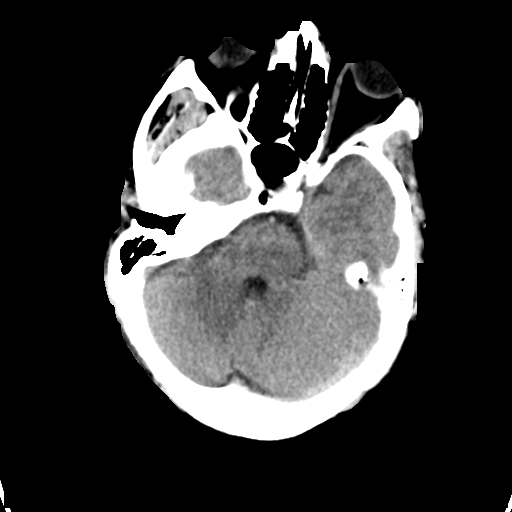
[im 12/34  brain]
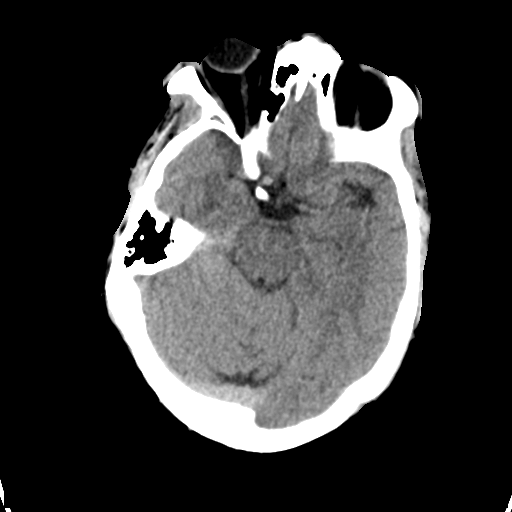
[im 15/34  brain]
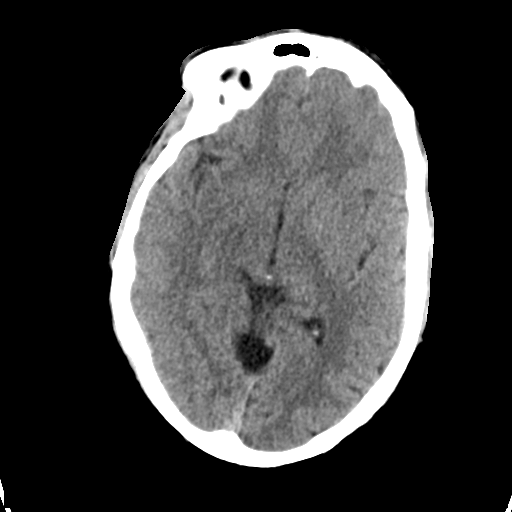
[im 15/34  bone]
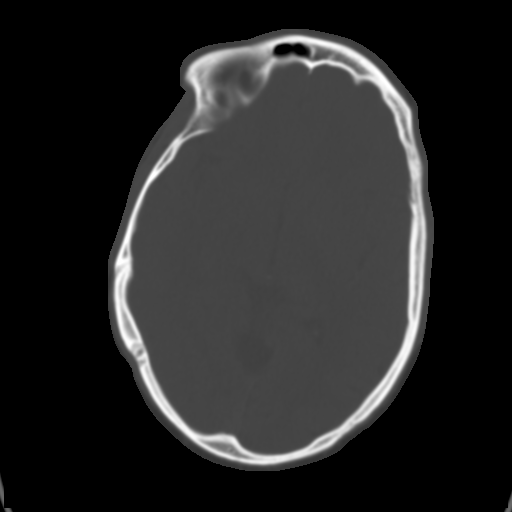
[im 19/34  brain]
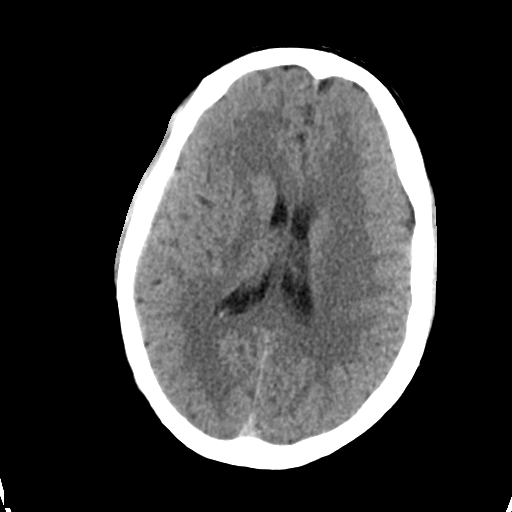
[im 22/34  brain]
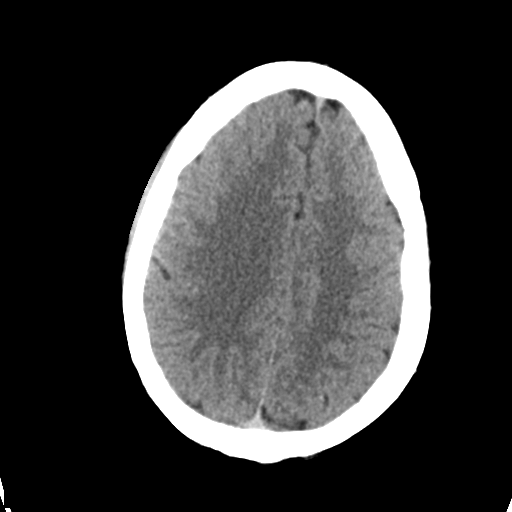
[im 24/34  brain]
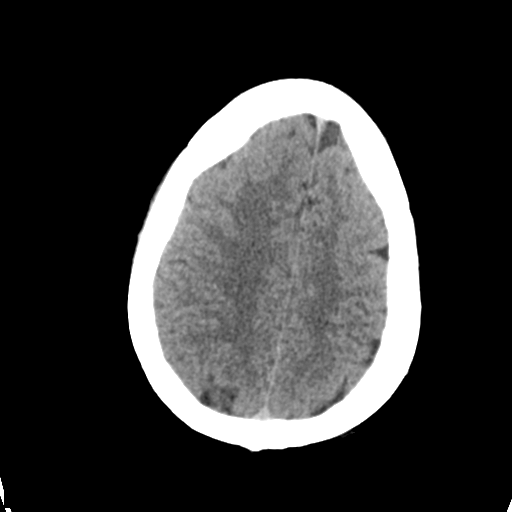
[im 29/34  brain]
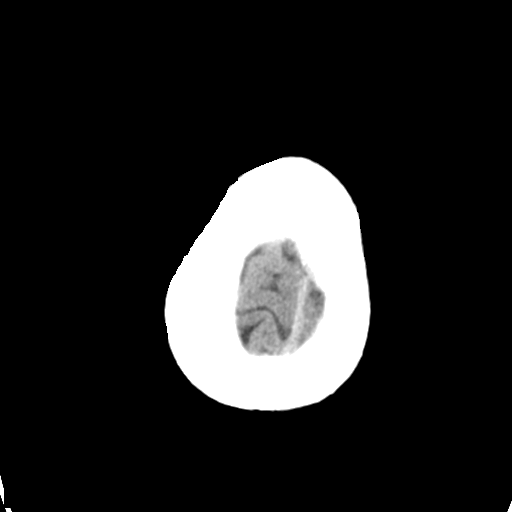
[im 29/34  bone]
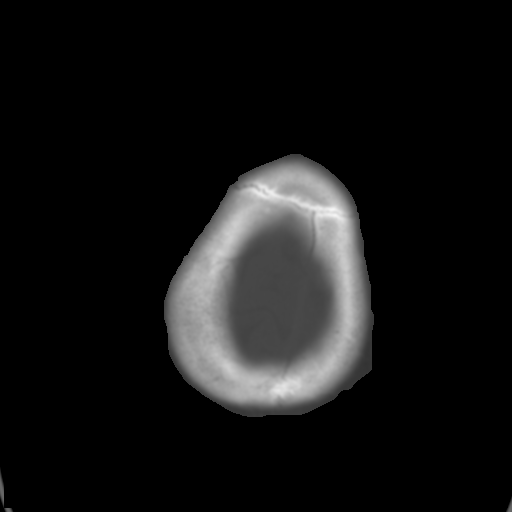
[im 31/34  brain]
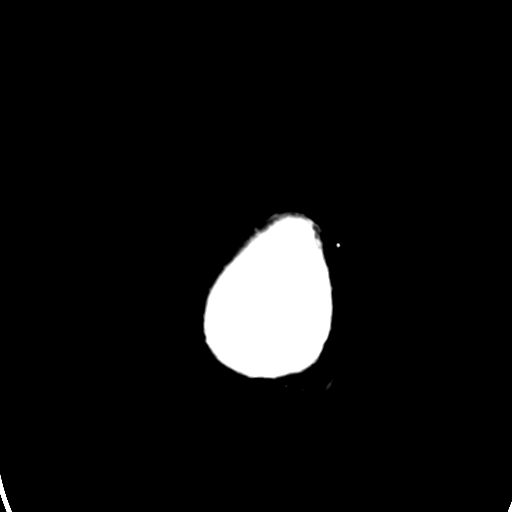

[Series 5: head 3.0 mpr cor · coronal · 0.33mm/px · 3 of 70 slices shown]
[im 24/70  brain]
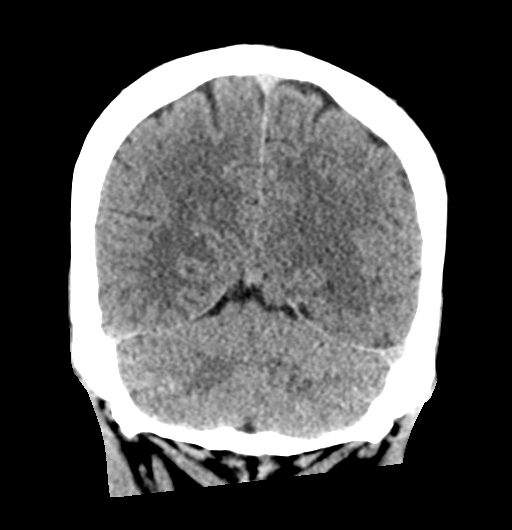
[im 31/70  brain]
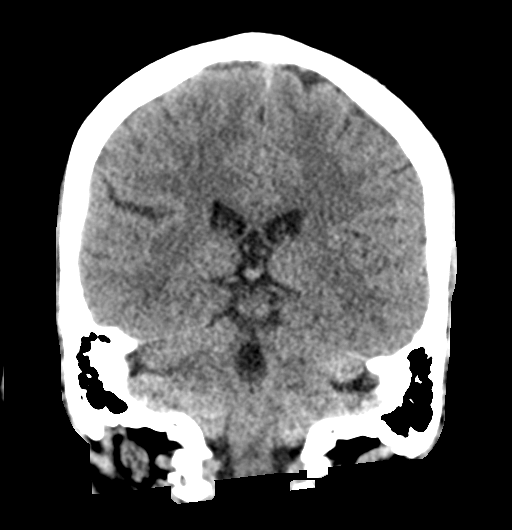
[im 39/70  brain]
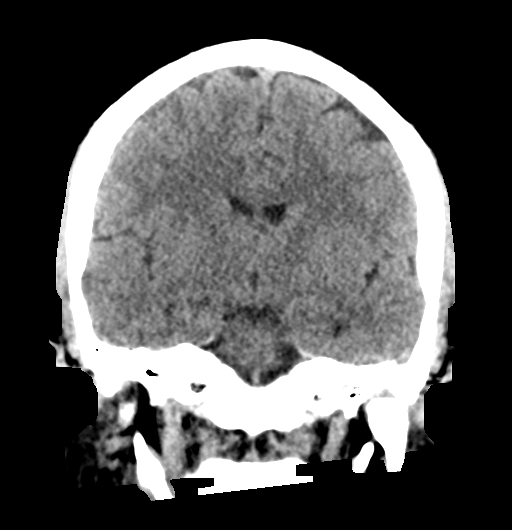

[Series 6: head 3.0 mpr sag · sagittal · 0.33mm/px · 3 of 61 slices shown]
[im 21/61  brain]
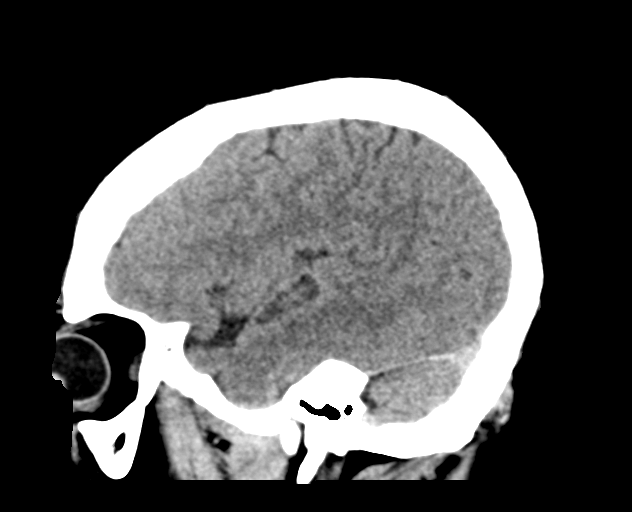
[im 31/61  brain]
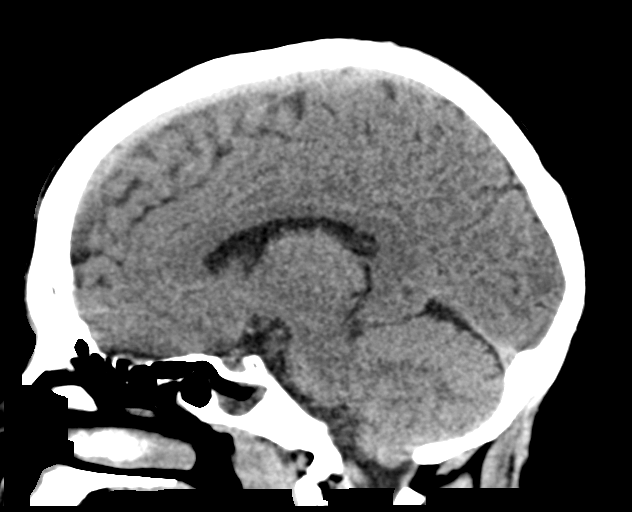
[im 40/61  brain]
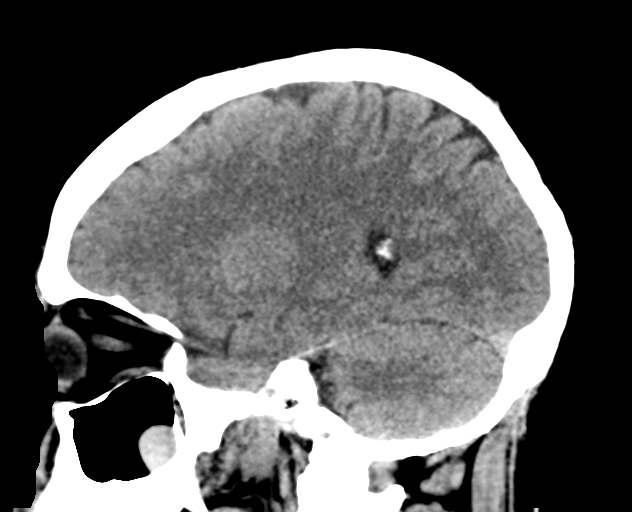

[16 of 47 positions shown; findings below may reference images not displayed]

FINDINGS: Brain: No acute infarct or hemorrhage. Lateral ventricles and
midline structures are unremarkable. No acute extra-axial fluid
collections. No mass effect.

Vascular: No hyperdense vessel or unexpected calcification.

Skull: Normal. Negative for fracture or focal lesion.

Sinuses/Orbits: Polypoid mucosal thickening within the maxillary
sinuses. Moderate mucoperiosteal thickening and retained secretions
within the sphenoid sinus.

Other: None.
IMPRESSION: 1. No acute intracranial process.

## 2022-10-11 ENCOUNTER — Other Ambulatory Visit (HOSPITAL_COMMUNITY): Payer: Self-pay

## 2022-10-11 MED ORDER — ONDANSETRON HCL 4 MG PO TABS
4.0000 mg | ORAL_TABLET | Freq: Three times a day (TID) | ORAL | 0 refills | Status: AC | PRN
  Filled 2022-10-11: qty 30, 10d supply, fill #0

## 2022-10-23 ENCOUNTER — Other Ambulatory Visit (HOSPITAL_COMMUNITY): Payer: Self-pay

## 2022-11-21 ENCOUNTER — Other Ambulatory Visit (HOSPITAL_COMMUNITY): Payer: Self-pay

## 2022-11-21 MED ORDER — SPIRONOLACTONE 25 MG PO TABS
ORAL_TABLET | Freq: Every day | ORAL | 0 refills | Status: AC
  Filled 2022-11-21 – 2023-05-25 (×2): qty 45, 90d supply, fill #0

## 2022-12-01 ENCOUNTER — Other Ambulatory Visit (HOSPITAL_COMMUNITY): Payer: Self-pay

## 2023-05-25 ENCOUNTER — Other Ambulatory Visit (HOSPITAL_COMMUNITY): Payer: Self-pay

## 2023-06-06 ENCOUNTER — Other Ambulatory Visit (HOSPITAL_COMMUNITY): Payer: Self-pay

## 2023-12-27 ENCOUNTER — Other Ambulatory Visit (HOSPITAL_COMMUNITY): Payer: Self-pay

## 2023-12-27 MED ORDER — SPIRONOLACTONE 25 MG PO TABS
12.5000 mg | ORAL_TABLET | Freq: Every day | ORAL | 0 refills | Status: AC
  Filled 2023-12-27 – 2024-01-17 (×2): qty 45, 90d supply, fill #0

## 2024-01-09 ENCOUNTER — Other Ambulatory Visit (HOSPITAL_COMMUNITY): Payer: Self-pay

## 2024-01-17 ENCOUNTER — Other Ambulatory Visit: Payer: Self-pay

## 2024-01-21 ENCOUNTER — Other Ambulatory Visit: Payer: Self-pay

## 2024-01-21 ENCOUNTER — Emergency Department (HOSPITAL_COMMUNITY)
Admission: EM | Admit: 2024-01-21 | Discharge: 2024-01-22 | Disposition: A | Attending: Emergency Medicine | Admitting: Emergency Medicine

## 2024-01-21 ENCOUNTER — Emergency Department (HOSPITAL_COMMUNITY)

## 2024-01-21 ENCOUNTER — Encounter (HOSPITAL_COMMUNITY): Payer: Self-pay

## 2024-01-21 DIAGNOSIS — R079 Chest pain, unspecified: Secondary | ICD-10-CM | POA: Insufficient documentation

## 2024-01-21 DIAGNOSIS — E871 Hypo-osmolality and hyponatremia: Secondary | ICD-10-CM | POA: Diagnosis not present

## 2024-01-21 DIAGNOSIS — D72829 Elevated white blood cell count, unspecified: Secondary | ICD-10-CM | POA: Insufficient documentation

## 2024-01-21 DIAGNOSIS — E876 Hypokalemia: Secondary | ICD-10-CM | POA: Insufficient documentation

## 2024-01-21 DIAGNOSIS — I509 Heart failure, unspecified: Secondary | ICD-10-CM | POA: Insufficient documentation

## 2024-01-21 DIAGNOSIS — I110 Hypertensive heart disease with heart failure: Secondary | ICD-10-CM | POA: Insufficient documentation

## 2024-01-21 LAB — COMPREHENSIVE METABOLIC PANEL WITH GFR
ALT: 17 U/L (ref 0–44)
AST: 19 U/L (ref 15–41)
Albumin: 4.2 g/dL (ref 3.5–5.0)
Alkaline Phosphatase: 43 U/L (ref 38–126)
Anion gap: 10 (ref 5–15)
BUN: 32 mg/dL — ABNORMAL HIGH (ref 6–20)
CO2: 25 mmol/L (ref 22–32)
Calcium: 9.3 mg/dL (ref 8.9–10.3)
Chloride: 94 mmol/L — ABNORMAL LOW (ref 98–111)
Creatinine, Ser: 1.34 mg/dL — ABNORMAL HIGH (ref 0.61–1.24)
GFR, Estimated: >60 mL/min (ref >60)
Glucose, Bld: 184 mg/dL — ABNORMAL HIGH (ref 70–99)
Potassium: 3.2 mmol/L — ABNORMAL LOW (ref 3.5–5.1)
Sodium: 129 mmol/L — ABNORMAL LOW (ref 135–145)
Total Bilirubin: 1.6 mg/dL — ABNORMAL HIGH (ref 0.0–1.2)
Total Protein: 7.9 g/dL (ref 6.5–8.1)

## 2024-01-21 LAB — CBC
HCT: 50.9 % (ref 39.0–52.0)
Hemoglobin: 17.1 g/dL — ABNORMAL HIGH (ref 13.0–17.0)
MCH: 27.8 pg (ref 26.0–34.0)
MCHC: 33.6 g/dL (ref 30.0–36.0)
MCV: 82.8 fL (ref 80.0–100.0)
Platelets: 377 K/uL (ref 150–400)
RBC: 6.15 MIL/uL — ABNORMAL HIGH (ref 4.22–5.81)
RDW: 13.3 % (ref 11.5–15.5)
WBC: 12.2 K/uL — ABNORMAL HIGH (ref 4.0–10.5)
nRBC: 0 % (ref 0.0–0.2)

## 2024-01-21 LAB — BRAIN NATRIURETIC PEPTIDE: B Natriuretic Peptide: 32.4 pg/mL (ref 0.0–100.0)

## 2024-01-21 LAB — TROPONIN I (HIGH SENSITIVITY)
Troponin I (High Sensitivity): 17 ng/L (ref <18)
Troponin I (High Sensitivity): 19 ng/L — ABNORMAL HIGH (ref <18)

## 2024-01-21 NOTE — ED Provider Triage Note (Signed)
 Emergency Medicine Provider Triage Evaluation Note  David [name] , a 43 y.o. male  was evaluated in triage.  Pt complains of primarily right-sided chest pain that occasionally radiates to the left side of his chest that began 2 days prior.  He has also had intermittent nausea and vomiting.  History of HFrEF, also of type 2 diabetes and of hypertension.  Continues tobacco use, endorses 1 pack/day smoking history.  Echo and May 2023 shows LVEF of 40 to 45%.  Review of Systems  Positive: As above Negative:   Physical Exam  BP (!) 133/92 (BP Location: Right Arm)   Pulse (!) 102   Temp 98.6 F (37 C) (Oral)   Resp 16   SpO2 98%  Gen:   Awake, no distress   Resp:  Normal effort  MSK:   Moves extremities without difficulty  Other:    Medical Decision Making  Medically screening exam initiated at 5:25 PM.  Appropriate orders placed.  David [name] was informed that the remainder of the evaluation will be completed by another provider, this initial triage assessment does not replace that evaluation, and the importance of remaining in the ED until their evaluation is complete.  Initial orders placed   David [name], [place] 01/21/24 1726

## 2024-01-21 NOTE — ED Triage Notes (Signed)
 Patient reports  chest pain that started 2 days ago its on the right side of the chest without radiation. He reports that he has also been vomiting.

## 2024-01-22 ENCOUNTER — Other Ambulatory Visit: Payer: Self-pay

## 2024-01-22 ENCOUNTER — Other Ambulatory Visit (HOSPITAL_COMMUNITY): Payer: Self-pay

## 2024-01-22 LAB — TROPONIN I (HIGH SENSITIVITY): Troponin I (High Sensitivity): 21 ng/L — ABNORMAL HIGH (ref <18)

## 2024-01-22 MED ORDER — AMLODIPINE BESYLATE 5 MG PO TABS
5.0000 mg | ORAL_TABLET | Freq: Every day | ORAL | 2 refills | Status: AC
  Filled 2024-01-22 (×2): qty 30, 30d supply, fill #0

## 2024-01-22 NOTE — ED Provider Notes (Signed)
 [facility] Provider Note   CSN: [id number] Arrival date & time: 01/21/24  1655     History Chief Complaint  Patient presents with   Chest Pain   Emesis    HPI David [name] is a 43 y.o. male presenting for chief complaint of chest pain.  He is a 43 year old male with complex medical history of heart failure secondary to substance use disorder, longstanding cocaine cardiomyopathy, hypertension, hyperlipidemia.  He states that he has recently been using drugs and then last night he had multiple episodes of emesis and chest pain.  Denies fevers chills nausea vomiting shortness of breath this time.  Had an 11-hour wait prior to my evaluation and states that all of his symptoms have resolved this point.  Has been able to tolerate p.o. intake is now ambulatory no acute distress.   Patient's recorded medical, surgical, social, medication list and allergies were reviewed in the Snapshot window as part of the initial history.   Review of Systems   Review of Systems  Constitutional:  Negative for chills and fever.  HENT:  Negative for ear pain and sore throat.   Eyes:  Negative for pain and visual disturbance.  Respiratory:  Positive for shortness of breath. Negative for cough.   Cardiovascular:  Positive for chest pain. Negative for palpitations.  Gastrointestinal:  Negative for abdominal pain and vomiting.  Genitourinary:  Negative for dysuria and hematuria.  Musculoskeletal:  Negative for arthralgias and back pain.  Skin:  Negative for color change and rash.  Neurological:  Negative for seizures and syncope.  All other systems reviewed and are negative.   Physical Exam Updated Vital Signs BP (!) 134/101   Pulse 91   Temp 97.7 F (36.5 C) (Oral)   Resp 14   Ht 5' 6 (1.676 m)   Wt 61.2 kg   SpO2 100%   BMI 21.79 kg/m  Physical Exam Vitals and nursing note reviewed.  Constitutional:      General: He is not in acute  distress.    Appearance: He is well-developed.  HENT:     Head: Normocephalic and atraumatic.  Eyes:     Conjunctiva/sclera: Conjunctivae normal.  Cardiovascular:     Rate and Rhythm: Normal rate and regular rhythm.     Heart sounds: No murmur heard. Pulmonary:     Effort: Pulmonary effort is normal. No respiratory distress.     Breath sounds: Normal breath sounds.  Abdominal:     General: Abdomen is flat. There is no distension.     Palpations: Abdomen is soft.     Tenderness: There is no abdominal tenderness.  Musculoskeletal:        General: No swelling or deformity.     Cervical back: Neck supple.  Skin:    General: Skin is warm and dry.     Capillary Refill: Capillary refill takes less than 2 seconds.  Neurological:     Mental Status: He is alert and oriented to person, place, and time. Mental status is at baseline.  Psychiatric:        Mood and Affect: Mood normal.      ED Course/ Medical Decision Making/ A&P    Procedures Procedures   Medications Ordered in ED Medications - No data to display Medical Decision Making: DOMINICK MORELLA is a 43 y.o. male who presented to the ED today with chest pain, detailed above.  Based on patient's comorbidities, patient has a heart score of  0.    Additional history discussed with patient's family/caregivers.  Patient placed on continuous vitals and telemetry monitoring while in ED which was reviewed periodically.  Complete initial physical exam performed, notably the patient was HDS in NAD.   Reviewed and confirmed nursing documentation for past medical history, family history, social history.    Initial Assessment: With the patient's presentation of left-sided chest pain, most likely diagnosis is musculoskeletal chest pain versus GERD, although ACS remains on the differential. Other diagnoses were considered including (but not limited to) pulmonary embolism, community-acquired pneumonia, aortic dissection, pneumothorax,  underlying bony abnormality, anemia. These are considered less likely due to history of present illness and physical exam findings.    In particular, concerning pulmonary embolism: Patient is PERC negative and the they deny malignancy, recent surgery, history of DVT, or calf tenderness leading to a low risk Wells score. Aortic Dissection also reconsidered but seems less likely based on the location, quality, onset, and severity of symptoms in this case.. Patient also has a lack of underlying history of AD or TAA.  This is most consistent with an acute life/limb threatening illness complicated by underlying chronic conditions.   Initial Plan: EKG/Troponin testing/BNP testing to evaluate for cardiac pathology. Evaluate for dissection, bony abnormality, or pneumonia with chest x-ray and screening laboratory evaluation including CBC, BMP  Further evaluation for pulmonary embolism not indicated at this time based on patient's PERC and Wells score.  Further evaluation for Thoracic Aortic Dissection not indicated at this time based on patient's clinical history and PE findings.   Initial Study Results: EKG was reviewed independently. Rate, rhythm, axis, intervals all examined and without medically relevant abnormality. ST segments without concerns for elevations.    Laboratory  Delta  troponin demonstrated very slight increase.  This is consistent with his known cocaine use and better than prior presentations.  CBC and BMP without obvious metabolic or inflammatory abnormalities requiring further evaluation   Radiology  DG Chest 1 View Result Date: 01/21/2024 EXAM: 1 VIEW(S) XRAY OF THE CHEST 01/21/2024 06:03:00 PM COMPARISON: chest x-ray 05/29/2022 CLINICAL HISTORY: chest pain FINDINGS: LUNGS AND PLEURA: No focal pulmonary opacity. No pleural effusion. No pneumothorax. HEART AND MEDIASTINUM: No acute abnormality of the cardiac and mediastinal silhouettes. BONES AND SOFT TISSUES: No acute osseous  abnormality. IMPRESSION: 1. No acute cardiopulmonary process. Electronically signed by: Morgane Naveau MD 01/21/2024 07:19 PM EST RP Workstation: [id number]    Final Assessment and Plan: Given complete resolution of his pain, normalization of his blood pressure and relatively flat troponins over 11 hours in the emergency room.  There is no acute injury or indication for intervention. He is in agreement.  He is completely symptomatically resolved.  Will refer back to primary care for ongoing care and management outpatient setting.  Recommended to stay off of all sympathomimetics and other substances or he may suffer immediate cardiac demise any expressed understanding.  Disposition:  I have considered need for hospitalization, however, considering all of the above, I believe this patient is stable for discharge at this time.  Patient/family educated about specific return precautions for given chief complaint and symptoms.  Patient/family educated about follow-up with PCP.     Patient/family expressed understanding of return precautions and need for follow-up. Patient spoken to regarding all imaging and laboratory results and appropriate follow up for these results. All education provided in verbal form with additional information in written form. Time was allowed for answering of patient questions. Patient discharged.    Emergency Department  Medication Summary:   Medications - No data to display       Clinical Impression:  1. Chest pain, unspecified type      Discharge   Final Clinical Impression(s) / ED Diagnoses Final diagnoses:  Chest pain, unspecified type    Rx / DC Orders ED Discharge Orders          Ordered    amLODipine  (NORVASC ) 5 MG tablet  Daily        01/22/24 0329              Jerral Meth, MD 01/22/24 [phone]

## 2024-03-07 ENCOUNTER — Other Ambulatory Visit (HOSPITAL_COMMUNITY): Payer: Self-pay

## 2024-03-07 MED ORDER — ONDANSETRON 8 MG PO TBDP
8.0000 mg | ORAL_TABLET | Freq: Three times a day (TID) | ORAL | 1 refills | Status: AC
  Filled 2024-03-07: qty 30, 10d supply, fill #0

## 2024-03-07 MED ORDER — CARVEDILOL 3.125 MG PO TABS
3.1250 mg | ORAL_TABLET | Freq: Two times a day (BID) | ORAL | 0 refills | Status: DC
  Filled 2024-03-07: qty 60, 30d supply, fill #0

## 2024-03-10 ENCOUNTER — Other Ambulatory Visit: Payer: Self-pay

## 2024-03-27 ENCOUNTER — Other Ambulatory Visit: Payer: Self-pay

## 2024-03-27 ENCOUNTER — Other Ambulatory Visit (HOSPITAL_COMMUNITY): Payer: Self-pay

## 2024-03-27 MED ORDER — LANTUS SOLOSTAR 100 UNIT/ML ~~LOC~~ SOPN
10.0000 [IU] | PEN_INJECTOR | Freq: Every day | SUBCUTANEOUS | 0 refills | Status: AC
  Filled 2024-03-27: qty 9, 90d supply, fill #0

## 2024-03-27 MED ORDER — GLUCOSE BLOOD VI STRP
ORAL_STRIP | 5 refills | Status: AC
  Filled 2024-03-27: qty 100, 33d supply, fill #0

## 2024-03-27 MED ORDER — GLIPIZIDE ER 2.5 MG PO TB24
2.5000 mg | ORAL_TABLET | Freq: Every day | ORAL | 0 refills | Status: AC
  Filled 2024-03-27: qty 30, 30d supply, fill #0

## 2024-03-27 MED ORDER — ACCU-CHEK SOFTCLIX LANCETS MISC
5 refills | Status: AC
  Filled 2024-03-27: qty 100, 33d supply, fill #0

## 2024-03-27 MED ORDER — ACCU-CHEK GUIDE W/DEVICE KIT
PACK | 0 refills | Status: AC
  Filled 2024-03-27: qty 1, 30d supply, fill #0

## 2024-03-27 MED ORDER — AMLODIPINE-OLMESARTAN 5-20 MG PO TABS
1.0000 | ORAL_TABLET | Freq: Every day | ORAL | 0 refills | Status: AC
  Filled 2024-03-27: qty 30, 30d supply, fill #0

## 2024-03-27 MED ORDER — CARVEDILOL 3.125 MG PO TABS: 3.1250 mg | ORAL_TABLET | Freq: Two times a day (BID) | ORAL | 0 refills | Status: AC

## 2024-04-08 ENCOUNTER — Other Ambulatory Visit (HOSPITAL_COMMUNITY): Payer: Self-pay
